# Patient Record
Sex: Female | Born: 1990 | Race: Black or African American | Hispanic: No | Marital: Single | Smoking: Former smoker
Health system: Southern US, Community
[De-identification: ages and names within clinical notes are randomized; demographics above are authoritative.]

## PROBLEM LIST (undated history)

## (undated) DIAGNOSIS — G4700 Insomnia, unspecified: Secondary | ICD-10-CM

## (undated) DIAGNOSIS — B9689 Other specified bacterial agents as the cause of diseases classified elsewhere: Secondary | ICD-10-CM

## (undated) DIAGNOSIS — N760 Acute vaginitis: Secondary | ICD-10-CM

## (undated) DIAGNOSIS — B009 Herpesviral infection, unspecified: Secondary | ICD-10-CM

## (undated) DIAGNOSIS — F32A Depression, unspecified: Secondary | ICD-10-CM

## (undated) DIAGNOSIS — F329 Major depressive disorder, single episode, unspecified: Secondary | ICD-10-CM

## (undated) DIAGNOSIS — F419 Anxiety disorder, unspecified: Secondary | ICD-10-CM

## (undated) DIAGNOSIS — F3130 Bipolar disorder, current episode depressed, mild or moderate severity, unspecified: Secondary | ICD-10-CM

## (undated) HISTORY — DX: Insomnia, unspecified: G47.00

## (undated) HISTORY — DX: Anxiety disorder, unspecified: F41.9

## (undated) HISTORY — DX: Bipolar disorder, current episode depressed, mild or moderate severity, unspecified: F31.30

## (undated) HISTORY — DX: Depression, unspecified: F32.A

## (undated) HISTORY — PX: NO PAST SURGERIES: SHX2092

## (undated) HISTORY — DX: Major depressive disorder, single episode, unspecified: F32.9

---
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
## 2011-05-20 ENCOUNTER — Encounter (HOSPITAL_COMMUNITY): Payer: Self-pay | Admitting: *Deleted

## 2011-05-20 ENCOUNTER — Emergency Department (HOSPITAL_COMMUNITY)
Admission: EM | Admit: 2011-05-20 | Discharge: 2011-05-20 | Disposition: A | Discharge status: Home / Self Care | Payer: Self-pay | Attending: Emergency Medicine | Admitting: Emergency Medicine

## 2011-05-20 ENCOUNTER — Emergency Department (HOSPITAL_COMMUNITY): Payer: Self-pay

## 2011-05-20 DIAGNOSIS — O98819 Other maternal infectious and parasitic diseases complicating pregnancy, unspecified trimester: Secondary | ICD-10-CM | POA: Insufficient documentation

## 2011-05-20 DIAGNOSIS — O26899 Other specified pregnancy related conditions, unspecified trimester: Secondary | ICD-10-CM

## 2011-05-20 DIAGNOSIS — N898 Other specified noninflammatory disorders of vagina: Secondary | ICD-10-CM | POA: Insufficient documentation

## 2011-05-20 DIAGNOSIS — N852 Hypertrophy of uterus: Secondary | ICD-10-CM | POA: Insufficient documentation

## 2011-05-20 DIAGNOSIS — R1031 Right lower quadrant pain: Secondary | ICD-10-CM | POA: Insufficient documentation

## 2011-05-20 DIAGNOSIS — N949 Unspecified condition associated with female genital organs and menstrual cycle: Secondary | ICD-10-CM | POA: Insufficient documentation

## 2011-05-20 DIAGNOSIS — A599 Trichomoniasis, unspecified: Secondary | ICD-10-CM | POA: Insufficient documentation

## 2011-05-20 LAB — CBC
HCT: 31.5 % — ABNORMAL LOW (ref 36.0–46.0)
Hemoglobin: 10.8 g/dL — ABNORMAL LOW (ref 12.0–15.0)
MCH: 30.9 pg (ref 26.0–34.0)
MCHC: 34.3 g/dL (ref 30.0–36.0)
MCV: 90.3 fL (ref 78.0–100.0)
Platelets: 268 10*3/uL (ref 150–400)
RBC: 3.49 MIL/uL — ABNORMAL LOW (ref 3.87–5.11)
RDW: 13.7 % (ref 11.5–15.5)
WBC: 8.7 10*3/uL (ref 4.0–10.5)

## 2011-05-20 LAB — URINALYSIS, ROUTINE W REFLEX MICROSCOPIC
Bilirubin Urine: NEGATIVE
Glucose, UA: NEGATIVE mg/dL
Hgb urine dipstick: NEGATIVE
Ketones, ur: NEGATIVE mg/dL
Nitrite: NEGATIVE
Protein, ur: NEGATIVE mg/dL
Specific Gravity, Urine: 1.015 (ref 1.005–1.030)
Urobilinogen, UA: 1 mg/dL (ref 0.0–1.0)
pH: 6.5 (ref 5.0–8.0)

## 2011-05-20 LAB — BASIC METABOLIC PANEL
BUN: 10 mg/dL (ref 6–23)
CO2: 26 mEq/L (ref 19–32)
Calcium: 9.7 mg/dL (ref 8.4–10.5)
Chloride: 103 mEq/L (ref 96–112)
Creatinine, Ser: 0.53 mg/dL (ref 0.50–1.10)
GFR calc Af Amer: >90 mL/min (ref >90)
GFR calc non Af Amer: >90 mL/min (ref >90)
Glucose, Bld: 81 mg/dL (ref 70–99)
Potassium: 4.2 mEq/L (ref 3.5–5.1)
Sodium: 137 mEq/L (ref 135–145)

## 2011-05-20 LAB — WET PREP, GENITAL
Trich, Wet Prep: NONE SEEN
Yeast Wet Prep HPF POC: NONE SEEN

## 2011-05-20 LAB — URINE MICROSCOPIC-ADD ON

## 2011-05-20 LAB — HCG, QUANTITATIVE, PREGNANCY: hCG, Beta Chain, Quant, S: 13882 m[IU]/mL — ABNORMAL HIGH (ref <5)

## 2011-05-20 LAB — POCT PREGNANCY, URINE: Preg Test, Ur: POSITIVE — AB

## 2011-05-20 MED ORDER — CEFTRIAXONE SODIUM 250 MG IJ SOLR
250.0000 mg | Freq: Once | INTRAMUSCULAR | Status: AC
  Administered 2011-05-20: 250 mg via INTRAMUSCULAR
  Filled 2011-05-20: qty 250

## 2011-05-20 MED ORDER — AZITHROMYCIN 1 G PO PACK
1.0000 g | PACK | Freq: Once | ORAL | Status: AC
  Administered 2011-05-20: 1 g via ORAL
  Filled 2011-05-20: qty 1

## 2011-05-20 MED ORDER — LIDOCAINE HCL (PF) 1 % IJ SOLN
INTRAMUSCULAR | Status: AC
  Administered 2011-05-20: 2 mL
  Filled 2011-05-20: qty 5

## 2011-05-20 MED ORDER — CEPHALEXIN 500 MG PO CAPS: 500.0000 mg | ORAL_CAPSULE | Freq: Four times a day (QID) | ORAL | Status: DC

## 2011-05-20 MED ORDER — METRONIDAZOLE 500 MG PO TABS
2000.0000 mg | ORAL_TABLET | Freq: Once | ORAL | Status: AC
  Administered 2011-05-20: 2000 mg via ORAL
  Filled 2011-05-20: qty 4

## 2011-05-20 NOTE — ED Notes (Signed)
Reports being approx [redacted] weeks pregnant, having RLQ pain and one episode of yellow vaginal discharge. Denies any n/v/d.

## 2011-05-20 NOTE — Discharge Instructions (Signed)
You will need followup your GYN doctor soon as possible next week.  Return here for any worsening in your condition.  Tylenol for your pain.

## 2011-05-20 NOTE — ED Provider Notes (Signed)
History     CSN: [id number]  Arrival date & time 05/20/11  1651   First MD Initiated Contact with Patient 05/20/11 1850      Chief Complaint  Patient presents with  . Abdominal Pain  . Routine Prenatal Visit    (Consider location/radiation/quality/duration/timing/severity/associated sxs/prior treatment) HPI The patient presents to the emergency department with lower pelvic pain and she is 4 months pregnant.  She states she started having some yellow vaginal discharge 2 days ago.  Patient states that she has not had any vaginal bleeding.  Patient denies any nausea/vomiting, diarrhea, chest pain or shortness of breath.  She also denies fevers at this time. History reviewed. No pertinent past medical history.  History reviewed. No pertinent past surgical history.  History reviewed. No pertinent family history.  History  Substance Use Topics  . Smoking status: Not on file  . Smokeless tobacco: Not on file  . Alcohol Use: No    OB History    Grav Para Term Preterm Abortions TAB SAB Ect Mult Living   1               Review of Systems All pertinent positives and negatives reviewed in the history of present illness  Allergies  Review of patient's allergies indicates no known allergies.  Home Medications   Current Outpatient Rx  Name Route Sig Dispense Refill  . ONDANSETRON HCL 4 MG PO TABS Oral Take 4 mg by mouth every 8 (eight) hours as needed. For nausea    . PRENATAL VITAMIN PO Oral Take 1 tablet by mouth daily.      BP 121/63  Pulse 80  Temp(Src) 98 F (36.7 C) (Oral)  Resp 17  SpO2 100%  Physical Exam  Constitutional: She appears well-developed and well-nourished. No distress.  HENT:  Head: Normocephalic and atraumatic.  Right Ear: External ear normal.  Eyes: Pupils are equal, round, and reactive to light.  Cardiovascular: Normal rate, regular rhythm, normal heart sounds and intact distal pulses.  Exam reveals no gallop and no friction rub.   No murmur  heard. Pulmonary/Chest: Effort normal and breath sounds normal. No respiratory distress. She has no wheezes. She has no rales. She exhibits no tenderness.  Abdominal: Soft. Bowel sounds are normal. She exhibits no distension. There is no tenderness. There is no rebound and no guarding.  Genitourinary: Pelvic exam was performed with patient supine. Uterus is enlarged. Uterus is not tender. Cervix exhibits discharge. Cervix exhibits no motion tenderness and no friability. Right adnexum displays tenderness. There is tenderness around the vagina. No bleeding around the vagina. Vaginal discharge found.  Skin: Skin is warm and dry. No rash noted.    ED Course  Procedures (including critical care time)  Labs Reviewed  URINALYSIS, ROUTINE W REFLEX MICROSCOPIC - Abnormal; Notable for the following:    APPearance CLOUDY (*)    Leukocytes, UA LARGE (*)    All other components within normal limits  CBC - Abnormal; Notable for the following:    RBC 3.49 (*)    Hemoglobin 10.8 (*)    HCT 31.5 (*)    All other components within normal limits  HCG, QUANTITATIVE, PREGNANCY - Abnormal; Notable for the following:    hCG, Beta Chain, Quant, S [id number] (*)    All other components within normal limits  POCT PREGNANCY, URINE - Abnormal; Notable for the following:    Preg Test, Ur POSITIVE (*)    All other components within normal limits  URINE MICROSCOPIC-ADD ON -  Abnormal; Notable for the following:    Squamous Epithelial / LPF MANY (*)    Bacteria, UA MANY (*)    All other components within normal limits  WET PREP, GENITAL - Abnormal; Notable for the following:    Clue Cells Wet Prep HPF POC FEW (*)    WBC, Wet Prep HPF POC MANY (*)    All other components within normal limits  BASIC METABOLIC PANEL  GC/CHLAMYDIA PROBE AMP, GENITAL   US Ob Limited  05/20/2011  *RADIOLOGY REPORT*  Clinical Data: Right lower quadrant pain.  Vaginal discharge.  [redacted] weeks gestation.  LIMITED OBSTETRIC ULTRASOUND  Number of  Fetuses: 1 Heart Rate: 143bpm Movement: Yes Presentation: Variable Placental Location: Posterior Previa: No Amniotic Fluid (Subjective): Normal  BPD: 4.6cm   19 w   6d  MATERNAL FINDINGS: Cervix: Closed/ Uterus/Adnexae:  IMPRESSION: Single living intrauterine pregnancy.  No complications noted.  Recommend followup with non-emergent complete OB 14+ wk US examination for fetal biometric evaluation and anatomic survey. This could be performed at the [facility] [place].  Original Report Authenticated By: Rosealee Albee, M.D.     Patient has vaginal discharge noted on exam.  We will treat her for STDs along with trichomonas.  Patient explained she will need followup with her GYN doctor soon as possible.  She is told to return here for any worsening in her condition.  The cervix is closed on exam however there is discharge noted vaginally.    MDM  MDM Reviewed: nursing note and vitals Interpretation: labs and ultrasound            Carlyle Dolly, PA-C 05/20/11 2338

## 2011-05-21 NOTE — ED Provider Notes (Signed)
Medical screening examination/treatment/procedure(s) were performed by non-physician practitioner and as supervising physician I was immediately available for consultation/collaboration.  Raeford Razor, MD 05/21/11 [phone]

## 2011-05-22 LAB — GC/CHLAMYDIA PROBE AMP, GENITAL
Chlamydia, DNA Probe: NEGATIVE
GC Probe Amp, Genital: NEGATIVE

## 2014-01-19 ENCOUNTER — Encounter (HOSPITAL_COMMUNITY): Payer: Self-pay | Admitting: *Deleted

## 2015-12-08 ENCOUNTER — Emergency Department (HOSPITAL_COMMUNITY)
Admission: EM | Admit: 2015-12-08 | Discharge: 2015-12-09 | Disposition: A | Discharge status: Home / Self Care | Payer: Medicaid Other | Attending: Emergency Medicine | Admitting: Emergency Medicine

## 2015-12-08 ENCOUNTER — Encounter (HOSPITAL_COMMUNITY): Payer: Self-pay | Admitting: Emergency Medicine

## 2015-12-08 DIAGNOSIS — Z5181 Encounter for therapeutic drug level monitoring: Secondary | ICD-10-CM | POA: Diagnosis not present

## 2015-12-08 DIAGNOSIS — F1210 Cannabis abuse, uncomplicated: Secondary | ICD-10-CM | POA: Insufficient documentation

## 2015-12-08 DIAGNOSIS — R4182 Altered mental status, unspecified: Secondary | ICD-10-CM | POA: Diagnosis present

## 2015-12-08 DIAGNOSIS — F14129 Cocaine abuse with intoxication, unspecified: Secondary | ICD-10-CM | POA: Insufficient documentation

## 2015-12-08 LAB — BASIC METABOLIC PANEL
ANION GAP: 8 (ref 5–15)
BUN: 8 mg/dL (ref 6–20)
CHLORIDE: 103 mmol/L (ref 101–111)
CO2: 26 mmol/L (ref 22–32)
Calcium: 9.7 mg/dL (ref 8.9–10.3)
Creatinine, Ser: 0.82 mg/dL (ref 0.44–1.00)
GFR calc Af Amer: >60 mL/min (ref >60)
GLUCOSE: 100 mg/dL — AB (ref 65–99)
POTASSIUM: 3.5 mmol/L (ref 3.5–5.1)
SODIUM: 137 mmol/L (ref 135–145)

## 2015-12-08 LAB — CBG MONITORING, ED
GLUCOSE-CAPILLARY: 98 mg/dL (ref 65–99)
Glucose-Capillary: 90 mg/dL (ref 65–99)

## 2015-12-08 LAB — CBC
HEMATOCRIT: 39.5 % (ref 36.0–46.0)
HEMOGLOBIN: 13.7 g/dL (ref 12.0–15.0)
MCH: 31.1 pg (ref 26.0–34.0)
MCHC: 34.7 g/dL (ref 30.0–36.0)
MCV: 89.6 fL (ref 78.0–100.0)
Platelets: 270 10*3/uL (ref 150–400)
RBC: 4.41 MIL/uL (ref 3.87–5.11)
RDW: 13.3 % (ref 11.5–15.5)
WBC: 6.7 10*3/uL (ref 4.0–10.5)

## 2015-12-08 MED ORDER — SODIUM CHLORIDE 0.9 % IV BOLUS (SEPSIS)
1000.0000 mL | Freq: Once | INTRAVENOUS | Status: AC
  Administered 2015-12-09: 1000 mL via INTRAVENOUS

## 2015-12-08 MED ORDER — AMMONIA AROMATIC IN INHA
RESPIRATORY_TRACT | Status: AC
  Filled 2015-12-08: qty 10

## 2015-12-08 NOTE — ED Triage Notes (Signed)
Pt's family states that pt ate rock candy today that they bought outside of a convience store and she threw up and now she is not responding when spoken to. Alert.

## 2015-12-08 NOTE — ED Provider Notes (Signed)
[facility]-EMERGENCY DEPT Provider Note   CSN: [id number] Arrival date & time: 12/08/15  2201  By signing my name below, I, Tara Colon. Tara Colon, attest that this documentation has been prepared under the direction and in the presence of [name], MD.  Electronically Signed: Suzan Colon. Tara Colon, ED Scribe. 12/08/15. 12:02 AM.    History   Chief Complaint Chief Complaint  Patient presents with  . Altered Mental Status    The history is provided by a relative and the spouse. No language interpreter was used.  Altered Mental Status   This is a new problem. The current episode started yesterday. The problem has not changed since onset.Associated symptoms include somnolence and unresponsiveness. Pertinent negatives include no weakness, no agitation, no delusions and no self-injury. Risk factors include the patient not taking medications correctly. Her past medical history does not include seizures or AIDS.    HPI Comments: Tara Colon is a 25 y.o. female without any pertinent past medical history who presents to the Emergency Department here for altered mental status this evening. Boyfriend states pt ingested a substance that looked like "red rock candy" with a salt/sugar taste at approximately 2:00 AM yesterday. Substance was purchased from an unknown gentleman outside of a store. Family states pt began vomited at 5:00 PM this evening gradually became more lethargic afterwards. Boyfriend states she has not spoken or answered questions in several hours. No recent falls or injury sustained after ingestion. She is not currently on any medications. No prior history of same.  PCP: No primary care provider on file.    History reviewed. No pertinent past medical history.  There are no active problems to display for this patient.   History reviewed. No pertinent surgical history.  OB History    Gravida Para Term Preterm AB Living   1             SAB TAB Ectopic Multiple Live Births                     Home Medications    Prior to Admission medications   Medication Sig Start Date End Date Taking? Authorizing Provider  ondansetron (ZOFRAN) 4 MG tablet Take 4 mg by mouth every 8 (eight) hours as needed. For nausea    Historical Provider, MD  Prenatal Vit-Fe Sulfate-FA (PRENATAL VITAMIN PO) Take 1 tablet by mouth daily.    Historical Provider, MD    Family History History reviewed. No pertinent family history.  Social History Social History  Substance Use Topics  . Smoking status: Not on file  . Smokeless tobacco: Not on file  . Alcohol use No     Allergies   Review of patient's allergies indicates no known allergies.   Review of Systems Review of Systems  Unable to perform ROS: Mental status change  Skin: Negative for pallor and rash.  Neurological: Negative for weakness.  Psychiatric/Behavioral: Negative for agitation and self-injury.     Physical Exam Updated Vital Signs BP 152/76 (BP Location: Right Arm)   Pulse 104   Temp 100 F (37.8 C) (Oral)   Resp 20   LMP 11/24/2015 (Approximate)   SpO2 100%   Breastfeeding? Unknown   Physical Exam  Constitutional: She appears well-developed and well-nourished.  HENT:  Head: Normocephalic and atraumatic. Head is without raccoon's eyes and without Battle's sign.  Right Ear: No hemotympanum.  Left Ear: No hemotympanum.  Mouth/Throat: Oropharynx is clear and moist.  Eyes: Conjunctivae are normal.  Pupils are sluggish  bilaterally   Neck: Normal range of motion. Neck supple. No JVD present. No tracheal deviation present.  Cardiovascular: Normal rate, regular rhythm, normal heart sounds and intact distal pulses.   No murmur heard. Pulses:      Dorsalis pedis pulses are 2+ on the right side, and 2+ on the left side.  Pulmonary/Chest: Effort normal. No stridor. No respiratory distress. She has no wheezes. She has no rales.  Bradypnea  Abdominal: Soft. Bowel sounds are normal. She exhibits no distension and no mass.  There is no tenderness. There is no guarding.  Musculoskeletal: Normal range of motion.  All compartments soft  Neurological: She has normal reflexes. GCS eye subscore is 4. GCS verbal subscore is 1. GCS motor subscore is 6.  DTR are 2+ bilaterally   Nursing note and vitals reviewed.    ED Treatments / Results   DIAGNOSTIC STUDIES: Oxygen Saturation is 100% on RA, Normal by my interpretation.    COORDINATION OF CARE: 11:45 PM- Will order blood work, urinalysis, imaging, and EKG. Will give fluids. Discussed treatment plan with family at bedside and they agreed to plan.     1:01 AM- Pupillary response after 2 mg/2 mL of Narcan administration. Pupils now 3-5 mm which were initially point at 2 mm.  1:43 AM- Spoke with poison control who suggested continued monitoring and fluids.   Vitals:   12/09/15 0324 12/09/15 0330  BP:  114/65  Pulse:  84  Resp:  24  Temp: 98.7 F (37.1 C)     EKG Interpretation  Date/Time:  Wednesday December 08 2015 23:48:09 EDT Ventricular Rate:  87 PR Interval:    QRS Duration: 104 QT Interval:  356 QTC Calculation: 429 R Axis:   76 Text Interpretation:  Sinus rhythm Borderline T wave abnormalities Confirmed by [facility]  MD, Nickalous Stingley ([id number]) on 12/09/2015 12:06:08 AM      Results for orders placed or performed during the hospital encounter of 12/08/15  Basic metabolic panel  Result Value Ref Range   Sodium 137 135 - 145 mmol/L   Potassium 3.5 3.5 - 5.1 mmol/L   Chloride 103 101 - 111 mmol/L   CO2 26 22 - 32 mmol/L   Glucose, Bld 100 (H) 65 - 99 mg/dL   BUN 8 6 - 20 mg/dL   Creatinine, Ser 5.780.82 0.44 - 1.00 mg/dL   Calcium 9.7 8.9 - 46.910.3 mg/dL   GFR calc non Af Amer >60 >60 mL/min   GFR calc Af Amer >60 >60 mL/min   Anion gap 8 5 - 15  CBC  Result Value Ref Range   WBC 6.7 4.0 - 10.5 K/uL   RBC 4.41 3.87 - 5.11 MIL/uL   Hemoglobin 13.7 12.0 - 15.0 g/dL   HCT 62.939.5 52.836.0 - 41.346.0 %   MCV 89.6 78.0 - 100.0 fL   MCH 31.1 26.0 - 34.0 pg    MCHC 34.7 30.0 - 36.0 g/dL   RDW 24.413.3 01.011.5 - 27.215.5 %   Platelets 270 150 - 400 K/uL  Urinalysis, Routine w reflex microscopic  Result Value Ref Range   Color, Urine YELLOW YELLOW   APPearance CLEAR CLEAR   Specific Gravity, Urine 1.005 1.005 - 1.030   pH 6.0 5.0 - 8.0   Glucose, UA NEGATIVE NEGATIVE mg/dL   Hgb urine dipstick NEGATIVE NEGATIVE   Bilirubin Urine NEGATIVE NEGATIVE   Ketones, ur 15 (A) NEGATIVE mg/dL   Protein, ur NEGATIVE NEGATIVE mg/dL   Nitrite NEGATIVE NEGATIVE   Leukocytes, UA  NEGATIVE NEGATIVE  Comprehensive metabolic panel  Result Value Ref Range   Sodium 138 135 - 145 mmol/L   Potassium 3.4 (L) 3.5 - 5.1 mmol/L   Chloride 102 101 - 111 mmol/L   CO2 27 22 - 32 mmol/L   Glucose, Bld 99 65 - 99 mg/dL   BUN 8 6 - 20 mg/dL   Creatinine, Ser 1.61 0.44 - 1.00 mg/dL   Calcium 9.8 8.9 - 09.6 mg/dL   Total Protein 8.1 6.5 - 8.1 g/dL   Albumin 4.3 3.5 - 5.0 g/dL   AST 28 15 - 41 U/L   ALT 29 14 - 54 U/L   Alkaline Phosphatase 65 38 - 126 U/L   Total Bilirubin 0.6 0.3 - 1.2 mg/dL   GFR calc non Af Amer >60 >60 mL/min   GFR calc Af Amer >60 >60 mL/min   Anion gap 9 5 - 15  Lipase, blood  Result Value Ref Range   Lipase 17 11 - 51 U/L  Ethanol  Result Value Ref Range   Alcohol, Ethyl (B) <5 <5 mg/dL  Salicylate level  Result Value Ref Range   Salicylate Lvl <4.0 2.8 - 30.0 mg/dL  Acetaminophen level  Result Value Ref Range   Acetaminophen (Tylenol), Serum <10 (L) 10 - 30 ug/mL  Urine rapid drug screen (hosp performed)  Result Value Ref Range   Opiates NONE DETECTED NONE DETECTED   Cocaine POSITIVE (A) NONE DETECTED   Benzodiazepines NONE DETECTED NONE DETECTED   Amphetamines NONE DETECTED NONE DETECTED   Tetrahydrocannabinol POSITIVE (A) NONE DETECTED   Barbiturates NONE DETECTED NONE DETECTED  CBG monitoring, ED  Result Value Ref Range   Glucose-Capillary 90 65 - 99 mg/dL  CBG monitoring, ED  Result Value Ref Range   Glucose-Capillary 98 65 - 99  mg/dL  POC urine preg, ED (not at [facility])  Result Value Ref Range   Preg Test, Ur NEGATIVE NEGATIVE   Ct Head Wo Contrast  Result Date: 12/09/2015 CLINICAL DATA:  Acute onset of vomiting after eating rock candy. Patient unresponsive. Initial encounter. EXAM: CT HEAD WITHOUT CONTRAST TECHNIQUE: Contiguous axial images were obtained from the base of the skull through the vertex without intravenous contrast. COMPARISON:  CT of the head performed 01/23/2015 FINDINGS: Brain: No evidence of acute infarction, hemorrhage, hydrocephalus, extra-axial collection or mass lesion/mass effect. The posterior fossa, including the cerebellum, brainstem and fourth ventricle, is within normal limits. The third and lateral ventricles, and basal ganglia are unremarkable in appearance. The cerebral hemispheres are symmetric in appearance, with normal gray-white differentiation. No mass effect or midline shift is seen. Vascular: No hyperdense vessel or unexpected calcification. Skull: There is no evidence of fracture; visualized osseous structures are unremarkable in appearance. Sinuses/Orbits: The visualized portions of the orbits are within normal limits. The paranasal sinuses and mastoid air cells are well-aerated. Other: No significant soft tissue abnormalities are seen. IMPRESSION: Unremarkable noncontrast CT of the head. Electronically Signed   By: Roanna Raider M.D.   On: 12/09/2015 00:47     Radiology No results found.  Procedures Procedures (including critical care time)  Medications Ordered in ED Medications  ammonia inhalant (not administered)  sodium chloride 0.9 % bolus 1,000 mL (0 mLs Intravenous Stopped 12/09/15 0155)  acetaminophen (TYLENOL) suppository 650 mg (650 mg Rectal Given 12/09/15 0103)  sodium chloride 0.9 % bolus 500 mL (0 mLs Intravenous Stopped 12/09/15 0246)  naloxone (NARCAN) injection 2 mg (2 mg Intravenous Given 12/09/15 0101)  methylPREDNISolone sodium succinate (  SOLU-MEDROL) 125 mg/2 mL  injection 125 mg (125 mg Intravenous Given 12/09/15 0318)  famotidine (PEPCID) IVPB 20 mg premix (0 mg Intravenous Stopped 12/09/15 0317)     Initial Impression / Assessment and Plan / ED Course  I have reviewed the triage vital signs and the nursing notes.  Pertinent labs & imaging results that were available during my care of the patient were reviewed by me and considered in my medical decision making (see chart for details).   Peri orbital and lip swelling resolved.   Final Clinical Impressions(s) / ED Diagnoses   Final diagnoses:  None  Now awake alert and talking.  Feels well.  Was not trying to hurt herself.  Claims she did not know what was in the rock candy they bought.  No ETOH.  No cocaine.  No Marijuana.  No buying substances off the street.  Patient verbalizes understanding.  All questions answered to patient's satisfaction. Based on history and exam patient has been appropriately medically screened and emergency conditions excluded. Patient is stable for discharge at this time. Follow up with your PMD for recheck in 2 days and strict return precautions given   New Prescriptions New Prescriptions   No medications on file   I personally performed the services described in this documentation, which was scribed in my presence. The recorded information has been reviewed and is accurate.      Cy Blamer, MD 12/09/15 [phone]

## 2015-12-09 ENCOUNTER — Emergency Department (HOSPITAL_COMMUNITY): Payer: Medicaid Other

## 2015-12-09 LAB — COMPREHENSIVE METABOLIC PANEL
ALK PHOS: 65 U/L (ref 38–126)
ALT: 29 U/L (ref 14–54)
ANION GAP: 9 (ref 5–15)
AST: 28 U/L (ref 15–41)
Albumin: 4.3 g/dL (ref 3.5–5.0)
BILIRUBIN TOTAL: 0.6 mg/dL (ref 0.3–1.2)
BUN: 8 mg/dL (ref 6–20)
CO2: 27 mmol/L (ref 22–32)
CREATININE: 0.69 mg/dL (ref 0.44–1.00)
Calcium: 9.8 mg/dL (ref 8.9–10.3)
Chloride: 102 mmol/L (ref 101–111)
Glucose, Bld: 99 mg/dL (ref 65–99)
Potassium: 3.4 mmol/L — ABNORMAL LOW (ref 3.5–5.1)
SODIUM: 138 mmol/L (ref 135–145)
Total Protein: 8.1 g/dL (ref 6.5–8.1)

## 2015-12-09 LAB — RAPID URINE DRUG SCREEN, HOSP PERFORMED
AMPHETAMINES: NOT DETECTED
Barbiturates: NOT DETECTED
Benzodiazepines: NOT DETECTED
Cocaine: POSITIVE — AB
Opiates: NOT DETECTED
Tetrahydrocannabinol: POSITIVE — AB

## 2015-12-09 LAB — URINALYSIS, ROUTINE W REFLEX MICROSCOPIC
BILIRUBIN URINE: NEGATIVE
GLUCOSE, UA: NEGATIVE mg/dL
HGB URINE DIPSTICK: NEGATIVE
Ketones, ur: 15 mg/dL — AB
Leukocytes, UA: NEGATIVE
Nitrite: NEGATIVE
PH: 6 (ref 5.0–8.0)
Protein, ur: NEGATIVE mg/dL
SPECIFIC GRAVITY, URINE: 1.005 (ref 1.005–1.030)

## 2015-12-09 LAB — SALICYLATE LEVEL: Salicylate Lvl: <4 mg/dL (ref 2.8–30.0)

## 2015-12-09 LAB — POC URINE PREG, ED: PREG TEST UR: NEGATIVE

## 2015-12-09 LAB — ETHANOL

## 2015-12-09 LAB — LIPASE, BLOOD: LIPASE: 17 U/L (ref 11–51)

## 2015-12-09 LAB — ACETAMINOPHEN LEVEL: Acetaminophen (Tylenol), Serum: <10 ug/mL — ABNORMAL LOW (ref 10–30)

## 2015-12-09 MED ORDER — SODIUM CHLORIDE 0.9 % IV BOLUS (SEPSIS)
500.0000 mL | Freq: Once | INTRAVENOUS | Status: AC
  Administered 2015-12-09: 500 mL via INTRAVENOUS

## 2015-12-09 MED ORDER — ACETAMINOPHEN 650 MG RE SUPP
650.0000 mg | Freq: Once | RECTAL | Status: AC
  Administered 2015-12-09: 650 mg via RECTAL
  Filled 2015-12-09: qty 1

## 2015-12-09 MED ORDER — METHYLPREDNISOLONE SODIUM SUCC 125 MG IJ SOLR
125.0000 mg | Freq: Once | INTRAMUSCULAR | Status: AC
  Administered 2015-12-09: 125 mg via INTRAVENOUS
  Filled 2015-12-09: qty 2

## 2015-12-09 MED ORDER — NALOXONE HCL 2 MG/2ML IJ SOSY
2.0000 mg | PREFILLED_SYRINGE | Freq: Once | INTRAMUSCULAR | Status: AC
  Administered 2015-12-09: 2 mg via INTRAVENOUS
  Filled 2015-12-09: qty 2

## 2015-12-09 MED ORDER — FAMOTIDINE IN NACL 20-0.9 MG/50ML-% IV SOLN
20.0000 mg | Freq: Once | INTRAVENOUS | Status: AC
  Administered 2015-12-09: 20 mg via INTRAVENOUS
  Filled 2015-12-09: qty 50

## 2015-12-09 NOTE — ED Notes (Signed)
Pt presents with horizontal nystagmus.  During insertion of tylenol suppository, pt began to resist.

## 2015-12-09 NOTE — ED Notes (Signed)
Dr. Nicanor AlconPalumbo informed pt is ready to go home.

## 2015-12-09 NOTE — ED Notes (Signed)
Patient transported to CT 

## 2015-12-09 NOTE — ED Notes (Signed)
Pt now admits to smoking marijuana this week & stated "we bought some rock candy from the man at the store.  I gave him money but I can't remember how much.  The candy was sweet."

## 2015-12-09 NOTE — ED Notes (Addendum)
Pt stated "I'm ready to go".  Pt has been drinking fluids provided.

## 2015-12-09 NOTE — ED Notes (Signed)
IV attempt x2 without success.

## 2016-02-02 ENCOUNTER — Encounter (HOSPITAL_COMMUNITY): Payer: Self-pay | Admitting: Emergency Medicine

## 2016-02-02 ENCOUNTER — Emergency Department (HOSPITAL_COMMUNITY)
Admission: EM | Admit: 2016-02-02 | Discharge: 2016-02-02 | Disposition: A | Discharge status: Home / Self Care | Payer: Medicaid Other | Attending: Emergency Medicine | Admitting: Emergency Medicine

## 2016-02-02 DIAGNOSIS — N898 Other specified noninflammatory disorders of vagina: Secondary | ICD-10-CM | POA: Diagnosis present

## 2016-02-02 DIAGNOSIS — F1290 Cannabis use, unspecified, uncomplicated: Secondary | ICD-10-CM | POA: Insufficient documentation

## 2016-02-02 DIAGNOSIS — B9689 Other specified bacterial agents as the cause of diseases classified elsewhere: Secondary | ICD-10-CM

## 2016-02-02 DIAGNOSIS — N760 Acute vaginitis: Secondary | ICD-10-CM | POA: Diagnosis not present

## 2016-02-02 HISTORY — DX: Other specified bacterial agents as the cause of diseases classified elsewhere: N76.0

## 2016-02-02 HISTORY — DX: Other specified bacterial agents as the cause of diseases classified elsewhere: B96.89

## 2016-02-02 LAB — POC URINE PREG, ED: Preg Test, Ur: NEGATIVE

## 2016-02-02 LAB — URINALYSIS, ROUTINE W REFLEX MICROSCOPIC
BILIRUBIN URINE: NEGATIVE
GLUCOSE, UA: NEGATIVE mg/dL
KETONES UR: NEGATIVE mg/dL
LEUKOCYTES UA: NEGATIVE
Nitrite: NEGATIVE
PROTEIN: NEGATIVE mg/dL
Specific Gravity, Urine: >1.03 — ABNORMAL HIGH (ref 1.005–1.030)
pH: 6 (ref 5.0–8.0)

## 2016-02-02 LAB — WET PREP, GENITAL
Sperm: NONE SEEN
Trich, Wet Prep: NONE SEEN
Yeast Wet Prep HPF POC: NONE SEEN

## 2016-02-02 LAB — URINE MICROSCOPIC-ADD ON

## 2016-02-02 MED ORDER — AZITHROMYCIN 250 MG PO TABS
1000.0000 mg | ORAL_TABLET | Freq: Once | ORAL | Status: AC
  Administered 2016-02-02: 1000 mg via ORAL
  Filled 2016-02-02: qty 4

## 2016-02-02 MED ORDER — CEFTRIAXONE SODIUM 250 MG IJ SOLR
250.0000 mg | Freq: Once | INTRAMUSCULAR | Status: AC
  Administered 2016-02-02: 250 mg via INTRAMUSCULAR
  Filled 2016-02-02: qty 250

## 2016-02-02 MED ORDER — METRONIDAZOLE 500 MG PO TABS: 500.0000 mg | ORAL_TABLET | Freq: Two times a day (BID) | ORAL | 0 refills | Status: DC

## 2016-02-02 NOTE — Discharge Instructions (Signed)
You were treated in the emergency department with intramuscular Rocephin and oral Zithromax. Your wet prep suggest bacterial vaginosis. Please use Flagyl daily with a meal. Do not use anything with alcohol while taking the Flagyl medication. Someone from the flow managers office will call you if there is an abnormality with your cultures, or your lab work.

## 2016-02-02 NOTE — ED Triage Notes (Signed)
Pt reports white vaginal discharge and pelvic pain x1 week.  Pt denies n/v/d.

## 2016-02-02 NOTE — ED Provider Notes (Signed)
[facility]-EMERGENCY DEPT Provider Note   CSN: [id number] Arrival date & time: 02/02/16  1641     History   Chief Complaint Chief Complaint  Patient presents with  . Vaginal Discharge    HPI Tara Colon is a 25 y.o. female.  The history is provided by the patient.  Vaginal Discharge   This is a new problem. The current episode started more than 1 week ago. The problem occurs daily. The problem has not changed since onset.The discharge occurs while at rest. The discharge was white. Associated symptoms include abdominal pain and perineal odor. Pertinent negatives include no fever, no vomiting and no dysuria. She has tried nothing for the symptoms.    Past Medical History:  Diagnosis Date  . BV (bacterial vaginosis)     There are no active problems to display for this patient.   History reviewed. No pertinent surgical history.  OB History    Gravida Para Term Preterm AB Living   1             SAB TAB Ectopic Multiple Live Births                   Home Medications    Prior to Admission medications   Not on File    Family History History reviewed. No pertinent family history.  Social History Social History  Substance Use Topics  . Smoking status: Never Smoker  . Smokeless tobacco: Not on file  . Alcohol use No     Allergies   Patient has no known allergies.   Review of Systems Review of Systems  Constitutional: Negative for fever.  Gastrointestinal: Positive for abdominal pain. Negative for vomiting.  Genitourinary: Positive for pelvic pain and vaginal discharge. Negative for dysuria and genital sores.  All other systems reviewed and are negative.    Physical Exam Updated Vital Signs BP 128/67 (BP Location: Left Arm)   Pulse 77   Temp 98.1 F (36.7 C) (Oral)   Resp 20   Ht 5\' 7"  (1.702 m)   Wt 77.1 kg   LMP 01/19/2016 (Approximate)   SpO2 100%   BMI 26.63 kg/m   Physical Exam  Constitutional: She is oriented to person, place, and time.  She appears well-developed and well-nourished.  Non-toxic appearance.  HENT:  Head: Normocephalic.  Right Ear: Tympanic membrane and external ear normal.  Left Ear: Tympanic membrane and external ear normal.  Eyes: EOM and lids are normal. Pupils are equal, round, and reactive to light.  Neck: Normal range of motion. Neck supple. Carotid bruit is not present.  Cardiovascular: Normal rate, regular rhythm, normal heart sounds, intact distal pulses and normal pulses.   Pulmonary/Chest: Breath sounds normal. No respiratory distress.  Abdominal: Soft. Bowel sounds are normal. There is no tenderness. There is no guarding.  Genitourinary:  Genitourinary Comments: Chaperone present during the examination.  The external structures are in order. No unusual rash noted. There is a thick white to gray vaginal discharge with mild to moderate older. The cervix is friable. Is no cervical wall motion tenderness. There is no adnexal tenderness. No foreign body noted in the vaginal area.  Musculoskeletal: Normal range of motion.  Lymphadenopathy:       Head (right side): No submandibular adenopathy present.       Head (left side): No submandibular adenopathy present.    She has no cervical adenopathy.  Neurological: She is alert and oriented to person, place, and time. She has normal strength. No cranial  nerve deficit or sensory deficit.  Skin: Skin is warm and dry.  Psychiatric: She has a normal mood and affect. Her speech is normal.  Nursing note and vitals reviewed.    ED Treatments / Results  Labs (all labs ordered are listed, but only abnormal results are displayed) Labs Reviewed  WET PREP, GENITAL  URINALYSIS, ROUTINE W REFLEX MICROSCOPIC ([facility])  RPR  HIV ANTIBODY (ROUTINE TESTING)  POC URINE PREG, ED  GC/CHLAMYDIA PROBE AMP ([place]) NOT AT [facility]    EKG  EKG Interpretation None       Radiology No results found.  Procedures Procedures (including critical care  time)  Medications Ordered in ED Medications - No data to display   Initial Impression / Assessment and Plan / ED Course  I have reviewed the triage vital signs and the nursing notes.  Pertinent labs & imaging results that were available during my care of the patient were reviewed by me and considered in my medical decision making (see chart for details).  Clinical Course     **I have reviewed nursing notes, vital signs, and all appropriate lab and imaging results for this patient.*  Final Clinical Impressions(s) / ED Diagnoses  Pregnancy test is negative. Wet prep reveals bacterial vaginosis, otherwise negative. GC, Chlamydia, RPR, and HIV are pending. Patient prescribed Flagyl. I discussed with the patient the importance of staying away from all alcohol beverages in any form. We also discussed the need to take the Flagyl with a full meal to prevent stomach upset. Patient acknowledges understanding of the instructions.    Final diagnoses:  None    New Prescriptions New Prescriptions   No medications on file     Ivery QualeHobson Gennaro Lizotte, PA-C 02/02/16 1906    Vanetta MuldersScott Zackowski, MD 02/05/16 1549

## 2016-02-03 LAB — HIV ANTIBODY (ROUTINE TESTING W REFLEX): HIV Screen 4th Generation wRfx: NONREACTIVE

## 2016-02-03 LAB — GC/CHLAMYDIA PROBE AMP (~~LOC~~) NOT AT ARMC
Chlamydia: NEGATIVE
Neisseria Gonorrhea: NEGATIVE

## 2016-02-03 LAB — RPR: RPR: NONREACTIVE

## 2017-01-30 ENCOUNTER — Ambulatory Visit: Payer: Self-pay | Admitting: Physician Assistant

## 2017-02-20 ENCOUNTER — Encounter: Payer: Self-pay | Admitting: Physician Assistant

## 2017-02-20 ENCOUNTER — Ambulatory Visit: Payer: Medicaid Other | Admitting: Physician Assistant

## 2017-02-20 VITALS — BP 104/64 | HR 68 | Temp 97.1°F | Ht 68.0 in | Wt 176.8 lb

## 2017-02-20 DIAGNOSIS — N760 Acute vaginitis: Secondary | ICD-10-CM | POA: Diagnosis not present

## 2017-02-20 DIAGNOSIS — R300 Dysuria: Secondary | ICD-10-CM | POA: Insufficient documentation

## 2017-02-20 DIAGNOSIS — B9689 Other specified bacterial agents as the cause of diseases classified elsewhere: Secondary | ICD-10-CM | POA: Insufficient documentation

## 2017-02-20 DIAGNOSIS — N3001 Acute cystitis with hematuria: Secondary | ICD-10-CM | POA: Diagnosis not present

## 2017-02-20 DIAGNOSIS — F0630 Mood disorder due to known physiological condition, unspecified: Secondary | ICD-10-CM | POA: Insufficient documentation

## 2017-02-20 DIAGNOSIS — F319 Bipolar disorder, unspecified: Secondary | ICD-10-CM

## 2017-02-20 LAB — MICROSCOPIC EXAMINATION: RENAL EPITHEL UA: NONE SEEN /HPF

## 2017-02-20 LAB — URINALYSIS, COMPLETE
BILIRUBIN UA: NEGATIVE
Glucose, UA: NEGATIVE
Ketones, UA: NEGATIVE
LEUKOCYTES UA: NEGATIVE
Nitrite, UA: NEGATIVE
PH UA: 5.5 (ref 5.0–7.5)
PROTEIN UA: NEGATIVE
Specific Gravity, UA: >1.03 — ABNORMAL HIGH (ref 1.005–1.030)
Urobilinogen, Ur: 0.2 mg/dL (ref 0.2–1.0)

## 2017-02-20 LAB — WET PREP FOR TRICH, YEAST, CLUE
Clue Cell Exam: NEGATIVE
Trichomonas Exam: NEGATIVE
Yeast Exam: NEGATIVE

## 2017-02-20 MED ORDER — CIPROFLOXACIN HCL 500 MG PO TABS: 500.0000 mg | ORAL_TABLET | Freq: Two times a day (BID) | ORAL | 0 refills | Status: DC

## 2017-02-20 NOTE — Patient Instructions (Signed)

## 2017-02-20 NOTE — Progress Notes (Signed)
BP 104/64   Pulse 68   Temp (!) 97.1 F (36.2 C) (Oral)   Ht 5\' 8"  (1.727 m)   Wt 176 lb 12.8 oz (80.2 kg)   BMI 26.88 kg/m    Subjective:    Patient ID: Tara Colon Drennon, female    DOB: April 01, 1990, 26 y.o.   MRN: [id number]  HPI: Tara Colon Gillyard is a 26 y.o. female presenting on 02/20/2017 for New Patient (Initial Visit)  Is a new patient today.  She had primarily been seen through the health department in the past.  She does have Medicaid at this time.  In March 2018 the patient was hospitalized at old [facility] facility.  She had a discharge summary that showed medications including BuSpar, Depakote, Lunesta, gabapentin, Latuda, Seroquel, trazodone.  She states that several of those made her feel bad but she did not know which ones she ended up discontinuing them on.  She has not had follow-up with psychiatry.  She does have a diagnosis of bipolar disorder.  She also continues with bacterial discharge and urinary frequency.  She has had a history of urinary tract infections and frequent BV at infections.  We will check these today.  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  She will occasionally still use pot.  Relevant past medical, surgical, family and social history reviewed and updated as indicated. Allergies and medications reviewed and updated.  Past Medical History:  Diagnosis Date  . Anxiety   . Bipolar affect, depressed (HCC)   . BV (bacterial vaginosis)   . Depression     History reviewed. No pertinent surgical history.  Review of Systems  Constitutional: Negative.  Negative for activity change, fatigue and fever.  HENT: Negative.   Eyes: Negative.   Respiratory: Negative.  Negative for cough.   Cardiovascular: Negative.  Negative for chest pain.  Gastrointestinal: Negative.  Negative for abdominal pain.  Endocrine: Negative.   Genitourinary: Positive for dysuria, frequency, urgency and vaginal discharge. Negative for hematuria, vaginal bleeding and  vaginal pain.  Musculoskeletal: Negative.   Skin: Negative.   Neurological: Negative.     Allergies as of 02/20/2017   No Known Allergies     Medication List        Accurate as of 02/20/17 11:23 AM. Always use your most recent med list.          ciprofloxacin 500 MG tablet Commonly known as:  CIPRO Take 1 tablet (500 mg total) by mouth 2 (two) times daily.   metroNIDAZOLE 500 MG tablet Commonly known as:  FLAGYL Take 1 tablet (500 mg total) by mouth 2 (two) times daily. Take this medication with food.          Objective:    BP 104/64   Pulse 68   Temp (!) 97.1 F (36.2 C) (Oral)   Ht 5\' 8"  (1.727 m)   Wt 176 lb 12.8 oz (80.2 kg)   BMI 26.88 kg/m   No Known Allergies  Physical Exam  Constitutional: She is oriented to person, place, and time. She appears well-developed and well-nourished.  HENT:  Head: Normocephalic and atraumatic.  Eyes: Conjunctivae are normal. Pupils are equal, round, and reactive to light.  Cardiovascular: Normal rate, regular rhythm, normal heart sounds and intact distal pulses.  Pulmonary/Chest: Effort normal and breath sounds normal.  Abdominal: Soft. Bowel sounds are normal. She exhibits no distension and no mass. There is tenderness in the suprapubic area. There is no rebound, no guarding and  no CVA tenderness.  Neurological: She is alert and oriented to person, place, and time. She has normal reflexes.  Skin: Skin is warm and dry. No rash noted.  Psychiatric: She has a normal mood and affect. Her behavior is normal. Judgment and thought content normal.  Nursing note and vitals reviewed.   Results for orders placed or performed during the hospital encounter of 02/02/16  Wet prep, genital  Result Value Ref Range   Yeast Wet Prep HPF POC NONE SEEN NONE SEEN   Trich, Wet Prep NONE SEEN NONE SEEN   Clue Cells Wet Prep HPF POC PRESENT (A) NONE SEEN   WBC, Wet Prep HPF POC MODERATE (A) NONE SEEN   Sperm NONE SEEN   Urinalysis, Routine w  reflex microscopic (not at [facility])  Result Value Ref Range   Color, Urine YELLOW YELLOW   APPearance CLEAR CLEAR   Specific Gravity, Urine >1.030 (H) 1.005 - 1.030   pH 6.0 5.0 - 8.0   Glucose, UA NEGATIVE NEGATIVE mg/dL   Hgb urine dipstick TRACE (A) NEGATIVE   Bilirubin Urine NEGATIVE NEGATIVE   Ketones, ur NEGATIVE NEGATIVE mg/dL   Protein, ur NEGATIVE NEGATIVE mg/dL   Nitrite NEGATIVE NEGATIVE   Leukocytes, UA NEGATIVE NEGATIVE  RPR  Result Value Ref Range   RPR Ser Ql Non Reactive Non Reactive  HIV antibody  Result Value Ref Range   HIV Screen 4th Generation wRfx Non Reactive Non Reactive  Urine microscopic-add on  Result Value Ref Range   Squamous Epithelial / LPF 6-30 (A) NONE SEEN   WBC, UA 0-5 0 - 5 WBC/hpf   RBC / HPF 0-5 0 - 5 RBC/hpf   Bacteria, UA MANY (A) NONE SEEN  POC Urine Pregnancy, ED (do NOT order at [facility])  Result Value Ref Range   Preg Test, Ur NEGATIVE NEGATIVE  GC/Chlamydia probe amp ([place])not at [facility]  Result Value Ref Range   Chlamydia Negative    Neisseria gonorrhea Negative       Assessment & Plan:   1. Bacterial vaginosis - WET PREP FOR TRICH, YEAST, CLUE  2. Dysuria - Urine Culture - Urinalysis, Complete  3. Bipolar affective disorder, remission status unspecified (HCC) - Ambulatory referral to Psychiatry  4. Acute cystitis with hematuria cipro 500 mg 1 BID 10 days    Current Outpatient Medications:  .  metroNIDAZOLE (FLAGYL) 500 MG tablet, Take 1 tablet (500 mg total) by mouth 2 (two) times daily. Take this medication with food., Disp: 14 tablet, Rfl: 0 .  ciprofloxacin (CIPRO) 500 MG tablet, Take 1 tablet (500 mg total) by mouth 2 (two) times daily., Disp: 20 tablet, Rfl: 0 Continue all other maintenance medications as listed above.  Follow up plan: No Follow-up on file.  Educational handout given for survey  Remus LofflerAngel S. Willistine Ferrall PA-C [facility] [address]  [place], [place]  [id number] [phone]   02/20/2017, 11:23 AM

## 2017-02-22 LAB — URINE CULTURE

## 2017-04-04 ENCOUNTER — Telehealth: Payer: Self-pay | Admitting: Physician Assistant

## 2017-04-04 NOTE — Telephone Encounter (Signed)
Was patient was supposed to referred to psych? Do not see referral and patient is unsure. Please advise

## 2017-04-04 NOTE — Telephone Encounter (Signed)
Still checking

## 2017-04-10 NOTE — Telephone Encounter (Signed)
I am unable to see referral.Checked with Tara Colon and she can see this and will f/u on it

## 2017-04-10 NOTE — Telephone Encounter (Signed)
Patient in is [facility]'s workqueue to schedule

## 2017-04-11 ENCOUNTER — Encounter: Payer: Self-pay | Admitting: Physician Assistant

## 2017-04-11 ENCOUNTER — Telehealth: Payer: Self-pay | Admitting: Physician Assistant

## 2017-04-11 ENCOUNTER — Ambulatory Visit: Payer: Medicaid Other | Admitting: Physician Assistant

## 2017-04-11 VITALS — BP 116/66 | HR 75 | Temp 97.8°F | Ht 68.0 in | Wt 177.8 lb

## 2017-04-11 DIAGNOSIS — H60503 Unspecified acute noninfective otitis externa, bilateral: Secondary | ICD-10-CM | POA: Diagnosis not present

## 2017-04-11 MED ORDER — NEOMYCIN-COLIST-HC-THONZONIUM 3.3-3-10-0.5 MG/ML OT SUSP: 3.0000 [drp] | Freq: Four times a day (QID) | OTIC | 0 refills | Status: DC

## 2017-04-11 MED ORDER — CEFDINIR 300 MG PO CAPS: 300.0000 mg | ORAL_CAPSULE | Freq: Two times a day (BID) | ORAL | 0 refills | Status: DC

## 2017-04-11 MED ORDER — CIPROFLOXACIN-DEXAMETHASONE 0.3-0.1 % OT SUSP: 4.0000 [drp] | Freq: Two times a day (BID) | OTIC | 0 refills | Status: DC

## 2017-04-11 NOTE — Telephone Encounter (Signed)
Please review and advise.

## 2017-04-12 ENCOUNTER — Telehealth: Payer: Self-pay

## 2017-04-12 NOTE — Telephone Encounter (Signed)
[facility] left message 

## 2017-04-13 NOTE — Progress Notes (Signed)
BP 116/66   Pulse 75   Temp 97.8 F (36.6 C) (Oral)   Ht 5\' 8"  (1.727 m)   Wt 177 lb 12.8 oz (80.6 kg)   BMI 27.03 kg/m    Subjective:    Patient ID: Tara Colon, female    DOB: 1990-12-12, 27 y.o.   MRN: [id number]  HPI: Tara Colon is a 27 y.o. female presenting on 04/11/2017 for Ear Pain  Over the past week patient has had increasing ear pain.  There is also been drainage involved.  She has had a history of ear infections and swimmer's ear in the past.  She denies any fever or chills.  Relevant past medical, surgical, family and social history reviewed and updated as indicated. Allergies and medications reviewed and updated.  Past Medical History:  Diagnosis Date  . Anxiety   . Bipolar affect, depressed (HCC)   . BV (bacterial vaginosis)   . Depression     History reviewed. No pertinent surgical history.  Review of Systems  Constitutional: Negative.  Negative for chills, diaphoresis, fatigue and fever.  HENT: Positive for congestion and ear pain.   Eyes: Negative.   Respiratory: Negative.  Negative for shortness of breath and wheezing.   Gastrointestinal: Negative.   Genitourinary: Negative.     Allergies as of 04/11/2017   No Known Allergies     Medication List        Accurate as of 04/11/17 11:59 PM. Always use your most recent med list.          cefdinir 300 MG capsule Commonly known as:  OMNICEF Take 1 capsule (300 mg total) by mouth 2 (two) times daily. 1 po BID   ciprofloxacin-dexamethasone OTIC suspension Commonly known as:  CIPRODEX Place 4 drops into both ears 2 (two) times daily.          Objective:    BP 116/66   Pulse 75   Temp 97.8 F (36.6 C) (Oral)   Ht 5\' 8"  (1.727 m)   Wt 177 lb 12.8 oz (80.6 kg)   BMI 27.03 kg/m   No Known Allergies  Physical Exam  Constitutional: She is oriented to person, place, and time. She appears well-developed and well-nourished.  HENT:  Head: Normocephalic and atraumatic.  Right Ear: There  is drainage, swelling and tenderness.  Left Ear: There is drainage, swelling and tenderness.  Sl tenderness on left mastoid  Eyes: Conjunctivae and EOM are normal. Pupils are equal, round, and reactive to light.  Cardiovascular: Normal rate, regular rhythm, normal heart sounds and intact distal pulses.  Pulmonary/Chest: Effort normal and breath sounds normal.  Abdominal: Soft. Bowel sounds are normal.  Neurological: She is alert and oriented to person, place, and time. She has normal reflexes.  Skin: Skin is warm and dry. No rash noted.  Psychiatric: She has a normal mood and affect. Her behavior is normal. Judgment and thought content normal.        Assessment & Plan:   1. Acute otitis externa of both ears, unspecified type - cefdinir (OMNICEF) 300 MG capsule; Take 1 capsule (300 mg total) by mouth 2 (two) times daily. 1 po BID  Dispense: 20 capsule; Refill: 0 - ciprofloxacin-dexamethasone (CIPRODEX) OTIC suspension; Place 4 drops into both ears 2 (two) times daily.  Dispense: 7.5 mL; Refill: 0    Current Outpatient Medications:  .  cefdinir (OMNICEF) 300 MG capsule, Take 1 capsule (300 mg total) by mouth 2 (two) times daily. 1 po BID, Disp: 20  capsule, Rfl: 0 .  ciprofloxacin-dexamethasone (CIPRODEX) OTIC suspension, Place 4 drops into both ears 2 (two) times daily., Disp: 7.5 mL, Rfl: 0 Continue all other maintenance medications as listed above.  Follow up plan: No Follow-up on file.  Educational handout given for survey  Remus LofflerAngel S. Afiya Ferrebee PA-C [facility] [address]  [place], [place] [id number] [phone]   04/13/2017, 8:12 AM

## 2017-05-01 ENCOUNTER — Telehealth: Payer: Self-pay

## 2017-05-01 NOTE — Telephone Encounter (Signed)
Writer left a voice mail message on 05-13-2017; 04-26-2017 and 05-01-2017.  Patient will be placed in the inactive list with [facility].  Patient has a scheduled appt with a therapist Florencia Reasonseggy Bynum [facility].    Writer will inform the PCP and Dr. Vanetta ShawlHisada through epic in basket email.

## 2017-05-02 ENCOUNTER — Ambulatory Visit (INDEPENDENT_AMBULATORY_CARE_PROVIDER_SITE_OTHER): Payer: Medicaid Other | Admitting: Psychiatry

## 2017-05-02 ENCOUNTER — Encounter (HOSPITAL_COMMUNITY): Payer: Self-pay | Admitting: Psychiatry

## 2017-05-02 DIAGNOSIS — F3130 Bipolar disorder, current episode depressed, mild or moderate severity, unspecified: Secondary | ICD-10-CM | POA: Diagnosis not present
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
## 2017-05-03 ENCOUNTER — Encounter (HOSPITAL_COMMUNITY): Payer: Self-pay | Admitting: Psychiatry

## 2017-05-03 ENCOUNTER — Ambulatory Visit (INDEPENDENT_AMBULATORY_CARE_PROVIDER_SITE_OTHER): Payer: Medicaid Other | Admitting: Psychiatry

## 2017-05-03 VITALS — BP 120/77 | HR 73 | Ht 68.0 in | Wt 173.0 lb

## 2017-05-03 DIAGNOSIS — Z8659 Personal history of other mental and behavioral disorders: Secondary | ICD-10-CM | POA: Diagnosis not present

## 2017-05-03 DIAGNOSIS — Z62811 Personal history of psychological abuse in childhood: Secondary | ICD-10-CM

## 2017-05-03 DIAGNOSIS — G4700 Insomnia, unspecified: Secondary | ICD-10-CM | POA: Diagnosis not present

## 2017-05-03 DIAGNOSIS — F4310 Post-traumatic stress disorder, unspecified: Secondary | ICD-10-CM | POA: Insufficient documentation

## 2017-05-03 DIAGNOSIS — Z813 Family history of other psychoactive substance abuse and dependence: Secondary | ICD-10-CM | POA: Diagnosis not present

## 2017-05-03 DIAGNOSIS — F17210 Nicotine dependence, cigarettes, uncomplicated: Secondary | ICD-10-CM

## 2017-05-03 DIAGNOSIS — Z79899 Other long term (current) drug therapy: Secondary | ICD-10-CM

## 2017-05-03 DIAGNOSIS — Z915 Personal history of self-harm: Secondary | ICD-10-CM

## 2017-05-03 DIAGNOSIS — F0630 Mood disorder due to known physiological condition, unspecified: Secondary | ICD-10-CM

## 2017-05-03 DIAGNOSIS — Z811 Family history of alcohol abuse and dependence: Secondary | ICD-10-CM | POA: Diagnosis not present

## 2017-05-03 DIAGNOSIS — Z818 Family history of other mental and behavioral disorders: Secondary | ICD-10-CM

## 2017-05-03 LAB — TSH: TSH: 1.35 m[IU]/L

## 2017-05-03 MED ORDER — TRAZODONE HCL 50 MG PO TABS: ORAL_TABLET | ORAL | 0 refills | Status: DC

## 2017-05-03 MED ORDER — LURASIDONE HCL 20 MG PO TABS: ORAL_TABLET | ORAL | 0 refills | Status: DC
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
## 2017-05-03 NOTE — Patient Instructions (Addendum)
1. Start Latuda 20 mg daily for one week, then 40 mg daily  2. Start Trazodone 25-50 mg at night as needed for sleep 3. Return to clinic in one month for 30 mins

## 2017-05-09 ENCOUNTER — Encounter: Payer: Self-pay | Admitting: Physician Assistant

## 2017-05-09 ENCOUNTER — Ambulatory Visit: Payer: Medicaid Other | Admitting: Physician Assistant

## 2017-05-09 VITALS — BP 119/69 | HR 84 | Temp 98.9°F | Ht 68.0 in | Wt 176.4 lb

## 2017-05-09 DIAGNOSIS — Z202 Contact with and (suspected) exposure to infections with a predominantly sexual mode of transmission: Secondary | ICD-10-CM | POA: Diagnosis not present

## 2017-05-09 MED ORDER — FLUCONAZOLE 150 MG PO TABS: 150.0000 mg | ORAL_TABLET | Freq: Once | ORAL | 0 refills | Status: AC

## 2017-05-09 NOTE — Progress Notes (Signed)
BP 119/69   Pulse 84   Temp 98.9 F (37.2 C) (Oral)   Ht 5\' 8"  (1.727 m)   Wt 176 lb 6.4 oz (80 kg)   BMI 26.82 kg/m    Subjective:    Patient ID: Tara Colon, female    DOB: 17-Apr-1990, 27 y.o.   MRN: [id number]  HPI: Tara Colon is a 27 y.o. female presenting on 05/09/2017 for No chief complaint on file. Has been exposed to STD through unprotected sex. Wants testing, only symptom is yeast discharge and itching. No odor or copious drainage.   Past Medical History:  Diagnosis Date  . Anxiety   . Bipolar affect, depressed (HCC)   . BV (bacterial vaginosis)   . Depression   . Insomnia    Relevant past medical, surgical, family and social history reviewed and updated as indicated. Interim medical history since our last visit reviewed. Allergies and medications reviewed and updated. DATA REVIEWED: CHART IN EPIC  Family History reviewed for pertinent findings.  Review of Systems  Constitutional: Negative.   HENT: Negative.   Eyes: Negative.   Respiratory: Negative.   Gastrointestinal: Negative.   Genitourinary: Negative.     Allergies as of 05/09/2017   No Known Allergies     Medication List        Accurate as of 05/09/17  3:04 PM. Always use your most recent med list.          fluconazole 150 MG tablet Commonly known as:  DIFLUCAN Take 1 tablet (150 mg total) by mouth once for 1 dose.   lurasidone 20 MG Tabs tablet Commonly known as:  LATUDA 20 mg daily for one week, then 40 mg daily   traZODone 50 MG tablet Commonly known as:  DESYREL 25-50 mg at night as needed for sleep          Objective:    BP 119/69   Pulse 84   Temp 98.9 F (37.2 C) (Oral)   Ht 5\' 8"  (1.727 m)   Wt 176 lb 6.4 oz (80 kg)   BMI 26.82 kg/m   No Known Allergies  Wt Readings from Last 3 Encounters:  05/09/17 176 lb 6.4 oz (80 kg)  04/11/17 177 lb 12.8 oz (80.6 kg)  02/20/17 176 lb 12.8 oz (80.2 kg)    Physical Exam  Constitutional: She is oriented to person,  place, and time. She appears well-developed and well-nourished.  HENT:  Head: Normocephalic and atraumatic.  Eyes: Conjunctivae and EOM are normal. Pupils are equal, round, and reactive to light.  Cardiovascular: Normal rate, regular rhythm, normal heart sounds and intact distal pulses.  Pulmonary/Chest: Effort normal and breath sounds normal.  Abdominal: Soft. Bowel sounds are normal.  Neurological: She is alert and oriented to person, place, and time. She has normal reflexes.  Skin: Skin is warm and dry. No rash noted.  Psychiatric: She has a normal mood and affect. Her behavior is normal. Judgment and thought content normal.        Assessment & Plan:   1. Sexually transmitted disease exposure - STD Screen (8) - GC probe amplification, urine - fluconazole (DIFLUCAN) 150 MG tablet; Take 1 tablet (150 mg total) by mouth once for 1 dose.  Dispense: 1 tablet; Refill: 0 - Chlamydia/Gonococcus/Trichomonas, NAA   Continue all other maintenance medications as listed above.  Follow up plan: No Follow-up on file.  Educational handout given for survey  Remus Loffler PA-C [facility] [address]  [place], [place] [id number] [phone]   05/09/2017, 3:04 PM

## 2017-05-10 ENCOUNTER — Encounter: Payer: Self-pay | Admitting: Physician Assistant

## 2017-05-10 LAB — CHLAMYDIA/GONOCOCCUS/TRICHOMONAS, NAA
Chlamydia by NAA: NEGATIVE
Gonococcus by NAA: NEGATIVE
TRICH VAG BY NAA: NEGATIVE

## 2017-05-10 LAB — STD SCREEN (8)
HEP A IGM: NEGATIVE
HEP B C IGM: NEGATIVE
HEP B S AG: NEGATIVE
HIV Screen 4th Generation wRfx: NONREACTIVE
HSV 2 IgG, Type Spec: 14.9 index — ABNORMAL HIGH (ref 0.00–0.90)
Hep C Virus Ab: <0.1 s/co ratio (ref 0.0–0.9)
RPR Ser Ql: NONREACTIVE

## 2017-05-14 ENCOUNTER — Telehealth: Payer: Self-pay | Admitting: Physician Assistant

## 2017-05-14 NOTE — Telephone Encounter (Signed)
Patient aware of results.
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
## 2017-05-31 ENCOUNTER — Encounter (HOSPITAL_COMMUNITY): Payer: Self-pay | Admitting: Psychiatry

## 2017-05-31 ENCOUNTER — Ambulatory Visit (HOSPITAL_COMMUNITY): Payer: Medicaid Other | Admitting: Psychiatry

## 2017-05-31 ENCOUNTER — Telehealth (HOSPITAL_COMMUNITY): Payer: Self-pay | Admitting: Psychiatry

## 2017-05-31 VITALS — BP 109/76 | HR 80 | Ht 68.0 in | Wt 173.0 lb

## 2017-05-31 DIAGNOSIS — F0630 Mood disorder due to known physiological condition, unspecified: Secondary | ICD-10-CM | POA: Diagnosis not present

## 2017-05-31 DIAGNOSIS — F419 Anxiety disorder, unspecified: Secondary | ICD-10-CM

## 2017-05-31 DIAGNOSIS — Z818 Family history of other mental and behavioral disorders: Secondary | ICD-10-CM

## 2017-05-31 DIAGNOSIS — F4310 Post-traumatic stress disorder, unspecified: Secondary | ICD-10-CM | POA: Diagnosis not present

## 2017-05-31 DIAGNOSIS — F1099 Alcohol use, unspecified with unspecified alcohol-induced disorder: Secondary | ICD-10-CM

## 2017-05-31 DIAGNOSIS — Z811 Family history of alcohol abuse and dependence: Secondary | ICD-10-CM | POA: Diagnosis not present

## 2017-05-31 DIAGNOSIS — Z813 Family history of other psychoactive substance abuse and dependence: Secondary | ICD-10-CM | POA: Diagnosis not present

## 2017-05-31 DIAGNOSIS — F17210 Nicotine dependence, cigarettes, uncomplicated: Secondary | ICD-10-CM | POA: Diagnosis not present

## 2017-05-31 DIAGNOSIS — G4700 Insomnia, unspecified: Secondary | ICD-10-CM | POA: Diagnosis not present

## 2017-05-31 DIAGNOSIS — R450 Nervousness: Secondary | ICD-10-CM

## 2017-05-31 MED ORDER — ARIPIPRAZOLE 10 MG PO TABS: ORAL_TABLET | ORAL | 1 refills | Status: DC

## 2017-05-31 NOTE — Patient Instructions (Signed)
1. Discontinue latuda 2. Start Abilify 5 mg daily for two weeks, then 10 mg daily  3. Return to clinic in six weeks for 30 mins 4. Discontinue trazodone
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
## 2017-06-04 ENCOUNTER — Ambulatory Visit (HOSPITAL_COMMUNITY): Payer: Medicaid Other | Admitting: Psychiatry

## 2017-06-04 ENCOUNTER — Encounter (HOSPITAL_COMMUNITY): Payer: Self-pay | Admitting: Psychiatry

## 2017-06-04 DIAGNOSIS — F0630 Mood disorder due to known physiological condition, unspecified: Secondary | ICD-10-CM | POA: Diagnosis not present
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
## 2017-06-11 ENCOUNTER — Ambulatory Visit (INDEPENDENT_AMBULATORY_CARE_PROVIDER_SITE_OTHER): Payer: Medicaid Other | Admitting: Psychiatry

## 2017-06-11 ENCOUNTER — Encounter (HOSPITAL_COMMUNITY): Payer: Self-pay | Admitting: Psychiatry

## 2017-06-11 DIAGNOSIS — F0630 Mood disorder due to known physiological condition, unspecified: Secondary | ICD-10-CM
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
## 2017-06-18 ENCOUNTER — Ambulatory Visit (HOSPITAL_COMMUNITY): Payer: Self-pay | Admitting: Psychiatry

## 2017-07-10 ENCOUNTER — Ambulatory Visit (INDEPENDENT_AMBULATORY_CARE_PROVIDER_SITE_OTHER): Payer: Medicaid Other | Admitting: *Deleted

## 2017-07-10 DIAGNOSIS — Z23 Encounter for immunization: Secondary | ICD-10-CM

## 2017-07-10 NOTE — Progress Notes (Signed)
PPD placed L arm Pt tolerated well 

## 2017-07-12 LAB — TB SKIN TEST
Induration: 0 mm
TB SKIN TEST: NEGATIVE
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
## 2017-07-13 ENCOUNTER — Ambulatory Visit (HOSPITAL_COMMUNITY): Payer: Self-pay | Admitting: Psychiatry

## 2017-07-16 ENCOUNTER — Ambulatory Visit (HOSPITAL_COMMUNITY): Payer: Medicaid Other | Admitting: Psychiatry

## 2017-07-16 ENCOUNTER — Encounter (HOSPITAL_COMMUNITY): Payer: Self-pay | Admitting: Psychiatry

## 2017-07-16 VITALS — BP 102/66 | HR 80 | Ht 68.0 in | Wt 186.0 lb

## 2017-07-16 DIAGNOSIS — F17210 Nicotine dependence, cigarettes, uncomplicated: Secondary | ICD-10-CM | POA: Diagnosis not present

## 2017-07-16 DIAGNOSIS — R450 Nervousness: Secondary | ICD-10-CM

## 2017-07-16 DIAGNOSIS — Z811 Family history of alcohol abuse and dependence: Secondary | ICD-10-CM | POA: Diagnosis not present

## 2017-07-16 DIAGNOSIS — Z915 Personal history of self-harm: Secondary | ICD-10-CM

## 2017-07-16 DIAGNOSIS — F0630 Mood disorder due to known physiological condition, unspecified: Secondary | ICD-10-CM

## 2017-07-16 DIAGNOSIS — Z813 Family history of other psychoactive substance abuse and dependence: Secondary | ICD-10-CM | POA: Diagnosis not present

## 2017-07-16 DIAGNOSIS — G4700 Insomnia, unspecified: Secondary | ICD-10-CM | POA: Diagnosis not present

## 2017-07-16 DIAGNOSIS — Z560 Unemployment, unspecified: Secondary | ICD-10-CM

## 2017-07-16 DIAGNOSIS — F419 Anxiety disorder, unspecified: Secondary | ICD-10-CM | POA: Diagnosis not present

## 2017-07-16 DIAGNOSIS — F4310 Post-traumatic stress disorder, unspecified: Secondary | ICD-10-CM | POA: Diagnosis not present

## 2017-07-16 DIAGNOSIS — F1290 Cannabis use, unspecified, uncomplicated: Secondary | ICD-10-CM | POA: Diagnosis not present

## 2017-07-16 DIAGNOSIS — Z818 Family history of other mental and behavioral disorders: Secondary | ICD-10-CM

## 2017-07-16 MED ORDER — RISPERIDONE 0.5 MG PO TABS: 0.5000 mg | ORAL_TABLET | Freq: Every day | ORAL | 0 refills | Status: DC

## 2017-07-16 NOTE — Patient Instructions (Signed)
1. Start risperidone 0.5 mg at night  2. Return to clinic in one month for 30 mins

## 2017-07-18 ENCOUNTER — Telehealth (HOSPITAL_COMMUNITY): Payer: Self-pay | Admitting: Psychiatry

## 2017-07-18 NOTE — Telephone Encounter (Signed)
Received UDS result 07/2016. Only positive for cotinine.

## 2017-07-26 ENCOUNTER — Ambulatory Visit (HOSPITAL_COMMUNITY): Payer: Self-pay | Admitting: Psychiatry

## 2017-08-06 NOTE — Progress Notes (Signed)
Subjective: CC: UTi PCP: Remus Loffler, PA-C [id number] Tara Colon is a 27 y.o. female presenting to clinic today for:  1. Urinary symptoms Patient reports a 3 day h/o urinary frequency and urgency.  She notes bladder pressure .  Denies hematuria, fevers, chills, abdominal pain, nausea, vomiting, back pain.  Patient has used nothing for symptoms.  Patient denies a h/o frequent or recurrent UTIs.    2. Mood disorder/ sleep problem Patient reports that she is currently seeing a psychiatrist and has an appointment coming up in about a week.  She notes that she has been placed on Risperdal but that she started developing a headache from the medication and self discontinued.  She notes poor sleep.  She voices concern about the care that she is getting and feels like she is at her wits end.  She notes that she does feel like sometimes she would be better off dead but has no suicidal intent.  No homicidal intent.  ROS: Per HPI  No Known Allergies Past Medical History:  Diagnosis Date  . Anxiety   . Bipolar affect, depressed (HCC)   . BV (bacterial vaginosis)   . Depression   . Insomnia     Current Outpatient Medications:  .  risperiDONE (RISPERDAL) 0.5 MG tablet, Take 1 tablet (0.5 mg total) by mouth daily., Disp: 30 tablet, Rfl: 0 Social History   Socioeconomic History  . Marital status: Single    Spouse name: Not on file  . Number of children: Not on file  . Years of education: Not on file  . Highest education level: Not on file  Occupational History  . Not on file  Social Needs  . Financial resource strain: Not on file  . Food insecurity:    Worry: Not on file    Inability: Not on file  . Transportation needs:    Medical: Not on file    Non-medical: Not on file  Tobacco Use  . Smoking status: Current Every Day Smoker    Packs/day: 1.00    Years: 9.00    Pack years: 9.00    Types: Cigarettes  . Smokeless tobacco: Never Used  Substance and Sexual Activity  . Alcohol use:  Yes    Alcohol/week: 8.4 oz    Types: 14 Cans of beer per week    Comment: 24 oz of beer per day  . Drug use: Yes    Types: Marijuana    Comment: 5 blunts per month  . Sexual activity: Yes    Birth control/protection: None  Lifestyle  . Physical activity:    Days per week: Not on file    Minutes per session: Not on file  . Stress: Not on file  Relationships  . Social connections:    Talks on phone: Not on file    Gets together: Not on file    Attends religious service: Not on file    Active member of club or organization: Not on file    Attends meetings of clubs or organizations: Not on file    Relationship status: Not on file  . Intimate partner violence:    Fear of current or ex partner: Not on file    Emotionally abused: Not on file    Physically abused: Not on file    Forced sexual activity: Not on file  Other Topics Concern  . Not on file  Social History Narrative  . Not on file   Family History  Problem Relation Age of Onset  .  Anxiety disorder Father   . Depression Father   . Bipolar disorder Father   . Diabetes Maternal Grandmother   . Drug abuse Paternal Aunt   . Alcohol abuse Paternal Aunt   . Drug abuse Paternal Uncle   . Alcohol abuse Paternal Uncle   . Alcohol abuse Cousin   . Drug abuse Cousin     Objective: Office vital signs reviewed. BP 107/72   Pulse (!) 56   Temp 98.9 F (37.2 C) (Oral)   Ht  (1.727 m)   Wt 188 lb (85.3 kg)   BMI 28.59 kg/m   Physical Examination:  General: Awake, alert, well nourished, nontoxic, No acute distress HEENT: sclera white, MMM Pulm: normal work of breathing on room air GI: soft, non-tender, non-distended, bowel sounds present x4, no hepatomegaly, no splenomegaly, no masses GU: no suprapubic TTP, no CVA TTP Psych: Mood stable, affect appropriate, eye contact fair, does not appear to be responding to internal stimuli. Depression screen [facility] 2/9 08/07/2017 05/09/2017 04/11/2017  Decreased Interest Down, Depressed, Hopeless PHQ - 2 Score Altered sleeping Tired, decreased energy Change in appetite Feeling bad or failure about yourself  Trouble concentrating 3 0 0  Moving slowly or fidgety/restless 0 0 0  Suicidal thoughts 3 0 0  PHQ-9 Score Difficult doing work/chores - - -   Assessment/ Plan: 27 y.o. female   1. Acute cystitis without hematuria Patient is nontoxic-appearing on exam.  She is afebrile.  Her urinalysis with trace lysed blood and 1+ protein.  Urine micro with moderate amounts of bacteria and mucus threads present.  I placed an order for a urine culture.  I started her on Keflex 500 mg p.o. twice daily for the next 5 days.  She is also been sent in a Diflucan tablet to use if needed for yeast infection. - Urinalysis, Complete - Urine Culture  2. Mood disorder in conditions classified elsewhere Her screening questionnaire was higher than previous today.  Particularly, she scored a 3 on suicidal thoughts.  She voiced no suicidal plan or intent but does feel that she would "be better off dead" some days.  She is already plugged into psychiatry but I placed an internal referral for virtual behavioral health to reach out to the patient.  If we can get a sooner appointment with her psychiatrist this would be ideal.  I do not think that she poses a danger to herself or others at this time.  She was discharged home.   Orders Placed This Encounter  Procedures  . Urine Culture  . Urinalysis, Complete   Meds ordered this encounter  Medications  . cephALEXin (KEFLEX) 500 MG capsule    Sig: Take 1 capsule (500 mg total) by mouth 2 (two) times daily for 5 days.    Dispense:  10 capsule    Refill:  0  . fluconazole (DIFLUCAN) 150 MG tablet    Sig: Take 1 tablet (150 mg total) by mouth once for 1 dose.    Dispense:  1 tablet    Refill:  0     Saleemah Mollenhauer Hulen Skains, DO [facility] [phone]

## 2017-08-07 ENCOUNTER — Telehealth: Payer: Self-pay | Admitting: Licensed Clinical Social Worker

## 2017-08-07 ENCOUNTER — Encounter: Payer: Self-pay | Admitting: Family Medicine

## 2017-08-07 ENCOUNTER — Ambulatory Visit: Payer: Medicaid Other | Admitting: Family Medicine

## 2017-08-07 VITALS — BP 107/72 | HR 56 | Temp 98.9°F | Ht 68.0 in | Wt 188.0 lb

## 2017-08-07 DIAGNOSIS — F0630 Mood disorder due to known physiological condition, unspecified: Secondary | ICD-10-CM

## 2017-08-07 DIAGNOSIS — N3000 Acute cystitis without hematuria: Secondary | ICD-10-CM | POA: Diagnosis not present

## 2017-08-07 LAB — URINALYSIS, COMPLETE
BILIRUBIN UA: NEGATIVE
Glucose, UA: NEGATIVE
KETONES UA: NEGATIVE
LEUKOCYTES UA: NEGATIVE
NITRITE UA: NEGATIVE
PH UA: 5.5 (ref 5.0–7.5)
Specific Gravity, UA: >1.03 — ABNORMAL HIGH (ref 1.005–1.030)
Urobilinogen, Ur: 0.2 mg/dL (ref 0.2–1.0)

## 2017-08-07 LAB — MICROSCOPIC EXAMINATION: RENAL EPITHEL UA: NONE SEEN /HPF

## 2017-08-07 MED ORDER — CEPHALEXIN 500 MG PO CAPS: 500.0000 mg | ORAL_CAPSULE | Freq: Two times a day (BID) | ORAL | 0 refills | Status: AC

## 2017-08-07 MED ORDER — FLUCONAZOLE 150 MG PO TABS: 150.0000 mg | ORAL_TABLET | Freq: Once | ORAL | 0 refills | Status: AC

## 2017-08-07 NOTE — BH Specialist Note (Signed)
[facility] Initial Clinical Assessment  MRN: [id number] NAME: Tara Colon Date: 08/07/17  Start time: 250p End time: 310pm Total time: 20 minutes  Type of Contact: Type of Contact: Phone Call Initial Contact Patient consent obtained: Patient consent obtained for Virtual Visit: Yes Reason for Visit today: Reason for Your Call/Visit Today: My medications are not right. I stopped taking them since they gave me a headache.  Treatment History Patient recently received Inpatient Treatment: Have You Recently Been in Any Inpatient Treatment (Colon/Detox/Crisis Colon/28-Day Program)?: No  Facility/Program:    Date of discharge:   Patient currently being seen by therapist/psychiatrist: Do You Currently Have a Therapist/Psychiatrist?: Yes Patient currently receiving the following services: Patient Currently Receiving the Following Services:: Medication Management, Individual Therapy  Past Psychiatric History/Hospitalization(s): Anxiety: No Bipolar Disorder: Yes, diagnosed a few months ago.  Has attended two therapy session with Tara Colon.   Depression: No Mania: No Psychosis: No Schizophrenia: No Personality Disorder: No Hospitalization for psychiatric illness: No History of Electroconvulsive Shock Therapy: No Prior Suicide Attempts: No  Clinical Assessment:  PHQ-9 Assessments: Depression screen [facility] 2/9 08/07/2017 05/09/2017 04/11/2017  Decreased Interest Down, Depressed, Hopeless PHQ - 2 Score Altered sleeping Tired, decreased energy Change in appetite Feeling bad or failure about yourself  Trouble concentrating 3 0 0  Moving slowly or fidgety/restless 0 0 0  Suicidal thoughts 3 0 0  PHQ-9 Score Difficult doing work/chores - - -    GAD-7 Assessments: No flowsheet data found.   Social Functioning Social maturity: Social Maturity: Irresponsible Social judgement:    Stress Current stressors:  Current Stressors: Unknown cause Familial stressors: Familial Stressors: None Sleep: Sleep: No problems Appetite: Appetite: No problems Coping ability: Coping ability: Overwhelmed Patient taking medications as prescribed: Patient taking medications as prescribed: No(I stopped taking the medication that the Psychiatrist prescribed since it was giving me a headache.)  Current medications:  Outpatient Encounter Medications as of 08/07/2017  Medication Sig  . cephALEXin (KEFLEX) 500 MG capsule Take 1 capsule (500 mg total) by mouth 2 (two) times daily for 5 days.  . fluconazole (DIFLUCAN) 150 MG tablet Take 1 tablet (150 mg total) by mouth once for 1 dose.   No facility-administered encounter medications on file as of 08/07/2017.     Self-harm Behaviors Risk Assessment Self-harm risk factors:   Patient endorses recent thoughts of harming self: Have you recently had any thoughts about harming yourself?: No  [place] Suicide Severity Rating Scale: No flowsheet data found.  Danger to Others Risk Assessment Danger to others risk factors: Danger to Others Risk Factors: No risk factors noted Patient endorses recent thoughts of harming others: Notification required: No need or identified person  Dynamic Appraisal of Situational Aggression (DASA): No flowsheet data found.  Substance Use Assessment Patient recently consumed alcohol:    Alcohol Use Disorder Identification Test (AUDIT): No flowsheet data found. Patient recently used drugs:    Opioid Risk Assessment:  Patient is concerned about dependence or abuse of substances:    ASAM Multidimensional Assessment Summary:  Dimension 1:    Dimension 1 Rating:    Dimension 2:    Dimension 2 Rating:    Dimension 3:    Dimension 3 Rating:    Dimension 4:    Dimension 4 Rating:    Dimension 5:  Dimension 5 Rating:    Dimension 6:    Dimension 6 Rating:   ASAM's Severity Rating Score:   ASAM Recommended Level of Treatment:    Decreased  need for sleep: No  Euphoria: No  Self Injurious behaviors: No  Family History of mental illness: Yes (father, bipolar) Family History of substance abuse: Yes (father) Substance Abuse: No  DUI: No  Insomnia: No   History of violence: No  Physical, sexual or emotional abuse: No  Prior outpatient mental health therapy: Yes  Goals, Interventions and Follow-up Plan Goals: Improve medication compliance Interventions: Brief CBT Follow-up Plan: Refer to [facility] as Tara Colon is currently connected. She has an appointment with Therapist on Aug 09 2017 at 10am.  Summary of Clinical Assessment Summary: Tara Colon is a 27 year old female who was referred to [facility] by Tara Colon due to her recent decision to stop taking her medication.  Tara Colon wants to obtain treatment and reduce her symptoms. Tara Colon is motivated to address her unhealthy thinking habits that she has developed over the past few years.  Tara Colon has a family history of mental illness and substance abuse. Tara Colon is open to scheduling community based psycho therapy.  Tara Colon has had two sessions with [facility] at [facility] in [place]. Tara Colon has a scheduled appointment with her Psychiatrist on Aug 16, 2017 and an appointment with her Therapist on Aug 09, 2017.    [facility] will follow up with Tara Colon to ensure follow through on her scheduled appointment with therapist.   Tara Elk, LCSW

## 2017-08-07 NOTE — Patient Instructions (Signed)

## 2017-08-08 LAB — URINE CULTURE

## 2017-08-09 ENCOUNTER — Ambulatory Visit (HOSPITAL_COMMUNITY): Payer: Self-pay | Admitting: Psychiatry
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
## 2017-08-16 ENCOUNTER — Ambulatory Visit (INDEPENDENT_AMBULATORY_CARE_PROVIDER_SITE_OTHER): Payer: Medicaid Other | Admitting: Psychiatry

## 2017-08-16 ENCOUNTER — Encounter (HOSPITAL_COMMUNITY): Payer: Self-pay | Admitting: Psychiatry

## 2017-08-16 ENCOUNTER — Ambulatory Visit (HOSPITAL_COMMUNITY): Payer: Self-pay | Admitting: Psychiatry

## 2017-08-16 VITALS — BP 102/68 | HR 69 | Ht 68.0 in | Wt 188.0 lb

## 2017-08-16 DIAGNOSIS — F17210 Nicotine dependence, cigarettes, uncomplicated: Secondary | ICD-10-CM | POA: Diagnosis not present

## 2017-08-16 DIAGNOSIS — G4700 Insomnia, unspecified: Secondary | ICD-10-CM | POA: Diagnosis not present

## 2017-08-16 DIAGNOSIS — F0630 Mood disorder due to known physiological condition, unspecified: Secondary | ICD-10-CM

## 2017-08-16 DIAGNOSIS — Z813 Family history of other psychoactive substance abuse and dependence: Secondary | ICD-10-CM | POA: Diagnosis not present

## 2017-08-16 DIAGNOSIS — F4310 Post-traumatic stress disorder, unspecified: Secondary | ICD-10-CM | POA: Diagnosis not present

## 2017-08-16 DIAGNOSIS — Z818 Family history of other mental and behavioral disorders: Secondary | ICD-10-CM | POA: Diagnosis not present

## 2017-08-16 DIAGNOSIS — R45851 Suicidal ideations: Secondary | ICD-10-CM | POA: Diagnosis not present

## 2017-08-16 DIAGNOSIS — Z811 Family history of alcohol abuse and dependence: Secondary | ICD-10-CM

## 2017-08-16 DIAGNOSIS — F419 Anxiety disorder, unspecified: Secondary | ICD-10-CM | POA: Diagnosis not present

## 2017-08-16 MED ORDER — DIVALPROEX SODIUM ER 500 MG PO TB24: 500.0000 mg | ORAL_TABLET | Freq: Every day | ORAL | 0 refills | Status: DC

## 2017-08-16 NOTE — Patient Instructions (Signed)
1. Discontinue risperidone 2. Start Depakote 500 mg at night  3. Obtain blood test after 6/5 (VPA, CBC, CMP) 4. Return to clinic in one month for 30 mins 5. [facility] [place] [phone], [place], [place] [address] 6. Contact [facility] for vocational rehabilitation
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
## 2017-08-17 ENCOUNTER — Telehealth (HOSPITAL_COMMUNITY): Payer: Self-pay | Admitting: Psychiatry

## 2017-08-17 DIAGNOSIS — F0630 Mood disorder due to known physiological condition, unspecified: Secondary | ICD-10-CM

## 2017-08-17 LAB — COMPREHENSIVE METABOLIC PANEL
AG RATIO: 1.5 (calc) (ref 1.0–2.5)
ALT: 12 U/L (ref 6–29)
AST: 16 U/L (ref 10–30)
Albumin: 4.3 g/dL (ref 3.6–5.1)
Alkaline phosphatase (APISO): 83 U/L (ref 33–115)
BILIRUBIN TOTAL: 0.4 mg/dL (ref 0.2–1.2)
BUN: 13 mg/dL (ref 7–25)
CALCIUM: 10 mg/dL (ref 8.6–10.2)
CHLORIDE: 106 mmol/L (ref 98–110)
CO2: 28 mmol/L (ref 20–32)
Creat: 0.73 mg/dL (ref 0.50–1.10)
GLOBULIN: 2.9 g/dL (ref 1.9–3.7)
GLUCOSE: 96 mg/dL (ref 65–139)
POTASSIUM: 5.3 mmol/L (ref 3.5–5.3)
SODIUM: 141 mmol/L (ref 135–146)
TOTAL PROTEIN: 7.2 g/dL (ref 6.1–8.1)

## 2017-08-17 LAB — VALPROIC ACID LEVEL: Valproic Acid Lvl: <12.5 mg/L — ABNORMAL LOW (ref 50.0–100.0)

## 2017-08-17 LAB — CBC
HCT: 41.6 % (ref 35.0–45.0)
Hemoglobin: 13.9 g/dL (ref 11.7–15.5)
MCH: 30.8 pg (ref 27.0–33.0)
MCHC: 33.4 g/dL (ref 32.0–36.0)
MCV: 92 fL (ref 80.0–100.0)
MPV: 9.5 fL (ref 7.5–12.5)
PLATELETS: 274 10*3/uL (ref 140–400)
RBC: 4.52 10*6/uL (ref 3.80–5.10)
RDW: 12.3 % (ref 11.0–15.0)
WBC: 5.1 10*3/uL (ref 3.8–10.8)

## 2017-08-17 NOTE — Telephone Encounter (Signed)
Done

## 2017-08-17 NOTE — Telephone Encounter (Signed)
Please contact the patient. It seems like she draw a blood test yesterday, which was scheduled to be done next week. We need to get another blood draw for depakote level. Advise her to do it when she comes to this clinic to see Peggy (6/6). Print out the order and give it to her when she comes to the clinic.

## 2017-08-23 ENCOUNTER — Ambulatory Visit (HOSPITAL_COMMUNITY): Payer: Medicaid Other | Admitting: Psychiatry

## 2017-09-13 ENCOUNTER — Encounter: Payer: Self-pay | Admitting: Pediatrics

## 2017-09-13 ENCOUNTER — Ambulatory Visit (INDEPENDENT_AMBULATORY_CARE_PROVIDER_SITE_OTHER): Payer: Medicaid Other | Admitting: Pediatrics

## 2017-09-13 ENCOUNTER — Ambulatory Visit (HOSPITAL_COMMUNITY): Payer: Medicaid Other | Admitting: Psychiatry

## 2017-09-13 VITALS — BP 106/70 | HR 79 | Temp 98.2°F | Ht 68.0 in | Wt 191.8 lb

## 2017-09-13 DIAGNOSIS — R399 Unspecified symptoms and signs involving the genitourinary system: Secondary | ICD-10-CM | POA: Diagnosis not present

## 2017-09-13 DIAGNOSIS — N3090 Cystitis, unspecified without hematuria: Secondary | ICD-10-CM

## 2017-09-13 LAB — URINALYSIS, COMPLETE
BILIRUBIN UA: NEGATIVE
Glucose, UA: NEGATIVE
Leukocytes, UA: NEGATIVE
NITRITE UA: NEGATIVE
PH UA: 6 (ref 5.0–7.5)
Specific Gravity, UA: >1.03 — ABNORMAL HIGH (ref 1.005–1.030)
UUROB: 0.2 mg/dL (ref 0.2–1.0)

## 2017-09-13 LAB — MICROSCOPIC EXAMINATION: RENAL EPITHEL UA: NONE SEEN /HPF

## 2017-09-13 MED ORDER — NITROFURANTOIN MONOHYD MACRO 100 MG PO CAPS: 100.0000 mg | ORAL_CAPSULE | Freq: Two times a day (BID) | ORAL | 0 refills | Status: AC

## 2017-09-13 NOTE — Progress Notes (Signed)
  Subjective:   Patient ID: Tara Colon, female    DOB: 20-Dec-1990, 27 y.o.   MRN: [id number] CC: Burning with urinatiCherlynn Colon  HPI: Tara Colon is a 27 y.o. female   Started several days ago after bath with small amount of Clorox.  Has had burning with urination since then.  Some lower abdominal pain that comes and goes.  Feels like prior urinary tract infections.  No fevers.  Appetite is been fine.  No recent new sexual partners.  Denies any vaginal discharge.  Relevant past medical, surgical, family and social history reviewed. Allergies and medications reviewed and updated. Social History   Tobacco Use  Smoking Status Current Every Day Smoker  . Packs/day: 1.00  . Years: 9.00  . Pack years: 9.00  . Types: Cigarettes  Smokeless Tobacco Never Used   ROS: Per HPI   Objective:    BP 106/70   Pulse 79   Temp 98.2 F (36.8 C) (Oral)   Ht 5\' 8"  (1.727 m)   Wt 191 lb 12.8 oz (87 kg)   BMI 29.16 kg/m   Wt Readings from Last 3 Encounters:  09/13/17 191 lb 12.8 oz (87 kg)  08/07/17 188 lb (85.3 kg)  05/09/17 176 lb 6.4 oz (80 kg)    Gen: NAD, alert, cooperative with exam, NCAT EYES: EOMI, no conjunctival injection, or no icterus CV: NRRR, normal S1/S2, no murmur, distal pulses 2+ b/l Resp: CTABL, no wheezes, normal WOB Abd: +BS, soft, NTND. no guarding or organomegaly, no CVA tenderness Ext: No edema, warm Neuro: Alert and oriented MSK: normal muscle bulk  Assessment & Plan:  Tara Colon was seen today for burning with urination.  Diagnoses and all orders for this visit:  Cystitis Treat below based on symptoms.  Will follow-up final urine culture.  Avoid sit baths with any irritating substances soaps or fragrances.  Increase fluid intake, concentrated urine.  New proteinuria present last couple checks.  Will have patient return to clinic for recheck in 2 to 3 weeks when symptoms resolved and she is better hydrated. -     nitrofurantoin, macrocrystal-monohydrate, (MACROBID) 100  MG capsule; Take 1 capsule (100 mg total) by mouth 2 (two) times daily for 3 days.  UTI symptoms -     Urinalysis, Complete -     Urine Culture; Future -     Urine Culture  Other orders -     Microscopic Examination   Follow up plan: Return in about 3 weeks (around 10/04/2017) for follow up proteinuria. Rex Krasarol Michaella Imai, MD [facility]

## 2017-09-14 LAB — URINE CULTURE

## 2017-09-17 ENCOUNTER — Encounter: Payer: Self-pay | Admitting: Physician Assistant

## 2017-09-17 ENCOUNTER — Ambulatory Visit: Payer: Medicaid Other | Admitting: Physician Assistant

## 2017-09-17 ENCOUNTER — Ambulatory Visit (HOSPITAL_COMMUNITY): Payer: Medicaid Other | Admitting: Psychiatry

## 2017-09-17 VITALS — BP 104/67 | HR 75 | Temp 97.6°F | Ht 68.0 in | Wt 191.0 lb

## 2017-09-17 DIAGNOSIS — N760 Acute vaginitis: Secondary | ICD-10-CM | POA: Diagnosis not present

## 2017-09-17 DIAGNOSIS — R300 Dysuria: Secondary | ICD-10-CM

## 2017-09-17 DIAGNOSIS — R399 Unspecified symptoms and signs involving the genitourinary system: Secondary | ICD-10-CM

## 2017-09-17 DIAGNOSIS — B9689 Other specified bacterial agents as the cause of diseases classified elsewhere: Secondary | ICD-10-CM

## 2017-09-17 LAB — URINALYSIS, COMPLETE
BILIRUBIN UA: NEGATIVE
GLUCOSE, UA: NEGATIVE
LEUKOCYTES UA: NEGATIVE
Nitrite, UA: NEGATIVE
SPEC GRAV UA: 1.025 (ref 1.005–1.030)
Urobilinogen, Ur: 1 mg/dL (ref 0.2–1.0)
pH, UA: 6 (ref 5.0–7.5)

## 2017-09-17 LAB — MICROSCOPIC EXAMINATION: Renal Epithel, UA: NONE SEEN /hpf

## 2017-09-17 MED ORDER — FLUCONAZOLE 150 MG PO TABS: ORAL_TABLET | ORAL | 0 refills | Status: DC

## 2017-09-17 NOTE — Patient Instructions (Signed)
In a few days you may receive a survey in the mail or online from [employer] regarding your visit with us today. Please take a moment to fill this out. Your feedback is very important to our whole office. It can help us better understand your needs as well as improve your experience and satisfaction. Thank you for taking your time to complete it. We care about you.  Mahlik Lenn, PA-C  

## 2017-09-18 LAB — URINE CULTURE

## 2017-09-18 NOTE — Progress Notes (Signed)
BP 104/67   Pulse 75   Temp 97.6 F (36.4 C) (Oral)   Ht 5\' 8"  (1.727 m)   Wt 191 lb (86.6 kg)   BMI 29.04 kg/m    Subjective:    Patient ID: Tara Colon, female    DOB: [birth year], 27 y.o.   MRN: [id number]  HPI: Tara Colon is a 27 y.o. female presenting on 09/17/2017 for Burning when urinates; Dysuria; and Vaginal Discharge  This patient comes in for continued problems of dysuria.  She was treated for a urinary tract infection with 3 days of Macrobid.  She only has burning whenever she urinates.  She denies any change to her urine.  She has some slight white discharge without odor.  She denies any blood.  She has a little bit of itching but not a significant amount.  She has used a soaking bath and wonders if that has irritated things.  Past Medical History:  Diagnosis Date  . Anxiety   . Bipolar affect, depressed (HCC)   . BV (bacterial vaginosis)   . Depression   . Insomnia    Relevant past medical, surgical, family and social history reviewed and updated as indicated. Interim medical history since our last visit reviewed. Allergies and medications reviewed and updated. DATA REVIEWED: CHART IN EPIC  Family History reviewed for pertinent findings.  Review of Systems  Constitutional: Negative.   HENT: Negative.   Eyes: Negative.   Respiratory: Negative.   Gastrointestinal: Negative.   Genitourinary: Positive for dysuria and vaginal discharge. Negative for difficulty urinating, frequency and hematuria.    Allergies as of 09/17/2017   No Known Allergies     Medication List        Accurate as of 09/17/17 11:59 PM. Always use your most recent med list.          fluconazole 150 MG tablet Commonly known as:  DIFLUCAN 1 po q week x 4 weeks          Objective:    BP 104/67   Pulse 75   Temp 97.6 F (36.4 C) (Oral)   Ht 5\' 8"  (1.727 m)   Wt 191 lb (86.6 kg)   BMI 29.04 kg/m   No Known Allergies  Wt Readings from Last 3 Encounters:  09/17/17 191  lb (86.6 kg)  09/13/17 191 lb 12.8 oz (87 kg)  08/07/17 188 lb (85.3 kg)    Physical Exam  Constitutional: She is oriented to person, place, and time. She appears well-developed and well-nourished.  HENT:  Head: Normocephalic and atraumatic.  Eyes: Pupils are equal, round, and reactive to light. Conjunctivae and EOM are normal.  Cardiovascular: Normal rate, regular rhythm, normal heart sounds and intact distal pulses.  Pulmonary/Chest: Effort normal and breath sounds normal.  Abdominal: Soft. Bowel sounds are normal.  Genitourinary: There is no rash on the right labia. There is no rash on the left labia. No erythema, tenderness or bleeding in the vagina. Vaginal discharge found.  Genitourinary Comments: Normal to slightly thickened white discharge.  Endocervical sample obtained.  Neurological: She is alert and oriented to person, place, and time. She has normal reflexes.  Skin: Skin is warm and dry. No rash noted.  Psychiatric: She has a normal mood and affect. Her behavior is normal. Judgment and thought content normal.    Results for orders placed or performed in visit on 09/17/17  Microscopic Examination  Result Value Ref Range   WBC, UA 0-5 0 - 5 /hpf  RBC, UA 0-2 0 - 2 /hpf   Epithelial Cells (non renal) 0-10 0 - 10 /hpf   Renal Epithel, UA None seen None seen /hpf   Bacteria, UA Few (A) None seen/Few  Urinalysis, Complete  Result Value Ref Range   Specific Gravity, UA 1.025 1.005 - 1.030   pH, UA 6.0 5.0 - 7.5   Color, UA Amber (A) Yellow   Appearance Ur Clear Clear   Leukocytes, UA Negative Negative   Protein, UA 1+ (A) Negative/Trace   Glucose, UA Negative Negative   Ketones, UA 1+ (A) Negative   RBC, UA Trace (A) Negative   Bilirubin, UA Negative Negative   Urobilinogen, Ur 1.0 0.2 - 1.0 mg/dL   Nitrite, UA Negative Negative   Microscopic Examination See below:       Assessment & Plan:   1. Dysuria - Urine Culture - Urinalysis, Complete - NuSwab Vaginitis  Plus (VG+)  2. UTI symptoms - NuSwab Vaginitis Plus (VG+)  3. Bacterial vaginosis - NuSwab Vaginitis Plus (VG+)   Continue all other maintenance medications as listed above.  Follow up plan: No follow-ups on file.  Educational handout given for survey  Remus LofflerAngel S. Yaremi Stahlman PA-C [facility] [address]  [place], [place] [id number] [phone]   09/18/2017, 9:21 AM

## 2017-09-20 LAB — NUSWAB VAGINITIS PLUS (VG+)
ATOPOBIUM VAGINAE: HIGH {score} — AB
Candida albicans, NAA: NEGATIVE
Candida glabrata, NAA: NEGATIVE
Chlamydia trachomatis, NAA: NEGATIVE
MEGASPHAERA 1: HIGH {score} — AB
Neisseria gonorrhoeae, NAA: NEGATIVE
Trich vag by NAA: NEGATIVE

## 2017-09-24 ENCOUNTER — Other Ambulatory Visit: Payer: Self-pay | Admitting: Physician Assistant

## 2017-09-24 MED ORDER — CLINDAMYCIN HCL 150 MG PO CAPS: 150.0000 mg | ORAL_CAPSULE | Freq: Four times a day (QID) | ORAL | 0 refills | Status: DC

## 2017-10-01 ENCOUNTER — Ambulatory Visit (HOSPITAL_COMMUNITY): Payer: Self-pay | Admitting: Psychiatry

## 2017-10-05 ENCOUNTER — Ambulatory Visit: Payer: Medicaid Other | Admitting: Pediatrics

## 2017-10-09 ENCOUNTER — Ambulatory Visit: Payer: Medicaid Other | Admitting: Physician Assistant

## 2017-11-08 DIAGNOSIS — H10013 Acute follicular conjunctivitis, bilateral: Secondary | ICD-10-CM | POA: Diagnosis not present

## 2017-11-08 DIAGNOSIS — H40033 Anatomical narrow angle, bilateral: Secondary | ICD-10-CM | POA: Diagnosis not present

## 2017-11-09 ENCOUNTER — Telehealth: Payer: Self-pay | Admitting: Physician Assistant

## 2017-11-09 ENCOUNTER — Encounter (INDEPENDENT_AMBULATORY_CARE_PROVIDER_SITE_OTHER): Payer: Medicaid Other | Admitting: Ophthalmology

## 2017-11-09 DIAGNOSIS — H33022 Retinal detachment with multiple breaks, left eye: Secondary | ICD-10-CM | POA: Diagnosis not present

## 2017-11-09 DIAGNOSIS — H35412 Lattice degeneration of retina, left eye: Secondary | ICD-10-CM | POA: Diagnosis not present

## 2017-11-09 NOTE — Telephone Encounter (Signed)
Advised patient to contact her surgeon's office for this information.Patient voices understanding.

## 2017-11-17 DIAGNOSIS — R070 Pain in throat: Secondary | ICD-10-CM | POA: Diagnosis not present

## 2017-11-23 ENCOUNTER — Encounter (INDEPENDENT_AMBULATORY_CARE_PROVIDER_SITE_OTHER): Payer: Medicaid Other | Admitting: Ophthalmology

## 2017-11-23 DIAGNOSIS — H33303 Unspecified retinal break, bilateral: Secondary | ICD-10-CM

## 2017-11-27 ENCOUNTER — Ambulatory Visit: Payer: Medicaid Other | Admitting: Physician Assistant

## 2017-11-27 ENCOUNTER — Encounter: Payer: Self-pay | Admitting: Physician Assistant

## 2017-11-27 VITALS — BP 106/72 | HR 86 | Temp 98.1°F | Ht 68.0 in | Wt 190.8 lb

## 2017-11-27 DIAGNOSIS — Z7251 High risk heterosexual behavior: Secondary | ICD-10-CM

## 2017-11-27 DIAGNOSIS — J209 Acute bronchitis, unspecified: Secondary | ICD-10-CM | POA: Diagnosis not present

## 2017-11-27 DIAGNOSIS — R197 Diarrhea, unspecified: Secondary | ICD-10-CM

## 2017-11-27 LAB — URINALYSIS, COMPLETE
Bilirubin, UA: NEGATIVE
GLUCOSE, UA: NEGATIVE
Leukocytes, UA: NEGATIVE
Nitrite, UA: NEGATIVE
Urobilinogen, Ur: 0.2 mg/dL (ref 0.2–1.0)
pH, UA: 5.5 (ref 5.0–7.5)

## 2017-11-27 LAB — MICROSCOPIC EXAMINATION: Renal Epithel, UA: NONE SEEN /hpf

## 2017-11-27 MED ORDER — DOXYCYCLINE HYCLATE 100 MG PO TABS: 100.0000 mg | ORAL_TABLET | Freq: Two times a day (BID) | ORAL | 0 refills | Status: DC

## 2017-11-27 MED ORDER — METRONIDAZOLE 0.75 % VA GEL: 1.0000 | Freq: Two times a day (BID) | VAGINAL | 2 refills | Status: DC

## 2017-11-27 MED ORDER — ALBUTEROL SULFATE HFA 108 (90 BASE) MCG/ACT IN AERS: 2.0000 | INHALATION_SPRAY | Freq: Four times a day (QID) | RESPIRATORY_TRACT | 2 refills | Status: DC | PRN

## 2017-11-27 NOTE — Progress Notes (Signed)
BP 106/72   Pulse 86   Temp 98.1 F (36.7 C) (Oral)   Ht 5\' 8"  (1.727 m)   Wt 190 lb 12.8 oz (86.5 kg)   BMI 29.01 kg/m    Subjective:    Patient ID: Tara Colon, female    DOB: [birth year], 27 y.o.   MRN: [id number]  HPI: Tara Colon is a 27 y.o. female presenting on 11/27/2017 for STD testing; Cough; and chest congestion  Patient states that she has a stated checked every 6 months.  So we will try to do that today.  Patient also reports that she is having diarrhea on a frequent basis.  She cannot related to any specific food.  Of asked her to keep a food diary of anything that seems to aggravate her.  She denies any fever or chills.  No nausea vomiting.   This patient has had many days of sore throat and postnasal drainage, headache at times and sinus pressure. There is copious drainage at times. Denies any fever at this time. There has been a history of sinus infections in the past.  There is cough at night. It has become more prevalent in recent days.   Past Medical History:  Diagnosis Date  . Anxiety   . Bipolar affect, depressed (HCC)   . BV (bacterial vaginosis)   . Depression   . Insomnia    Relevant past medical, surgical, family and social history reviewed and updated as indicated. Interim medical history since our last visit reviewed. Allergies and medications reviewed and updated. DATA REVIEWED: CHART IN EPIC  Family History reviewed for pertinent findings.  Review of Systems  Constitutional: Positive for fatigue. Negative for activity change, appetite change, chills and fever.  HENT: Positive for congestion, postnasal drip, sinus pressure and sore throat.   Eyes: Negative.   Respiratory: Positive for cough. Negative for wheezing.   Cardiovascular: Negative.  Negative for chest pain, palpitations and leg swelling.  Gastrointestinal: Negative.   Genitourinary: Negative.   Musculoskeletal: Negative.   Skin: Negative.   Neurological: Positive for headaches.      Allergies as of 11/27/2017   No Known Allergies     Medication List        Accurate as of 11/27/17  4:39 PM. Always use your most recent med list.          albuterol 108 (90 Base) MCG/ACT inhaler Commonly known as:  PROVENTIL HFA;VENTOLIN HFA Inhale 2 puffs into the lungs every 6 (six) hours as needed for wheezing or shortness of breath.   doxycycline 100 MG tablet Commonly known as:  VIBRA-TABS Take 1 tablet (100 mg total) by mouth 2 (two) times daily. 1 po bid   metroNIDAZOLE 0.75 % vaginal gel Commonly known as:  METROGEL Place 1 Applicatorful vaginally 2 (two) times daily.          Objective:    BP 106/72   Pulse 86   Temp 98.1 F (36.7 C) (Oral)   Ht 5\' 8"  (1.727 m)   Wt 190 lb 12.8 oz (86.5 kg)   BMI 29.01 kg/m   No Known Allergies  Wt Readings from Last 3 Encounters:  11/27/17 190 lb 12.8 oz (86.5 kg)  09/17/17 191 lb (86.6 kg)  09/13/17 191 lb 12.8 oz (87 kg)    Physical Exam  Constitutional: She is oriented to person, place, and time. She appears well-developed and well-nourished.  HENT:  Head: Normocephalic and atraumatic.  Right Ear: A middle ear effusion is present.  Left Ear: A middle ear effusion is present.  Nose: Mucosal edema present. Right sinus exhibits no frontal sinus tenderness. Left sinus exhibits no frontal sinus tenderness.  Mouth/Throat: Posterior oropharyngeal erythema present. No oropharyngeal exudate or tonsillar abscesses.  Eyes: Pupils are equal, round, and reactive to light. Conjunctivae and EOM are normal.  Neck: Normal range of motion.  Cardiovascular: Normal rate, regular rhythm, normal heart sounds and intact distal pulses.  Pulmonary/Chest: Effort normal and breath sounds normal.  Abdominal: Soft. Bowel sounds are normal.  Neurological: She is alert and oriented to person, place, and time. She has normal reflexes.  Skin: Skin is warm and dry. No rash noted.  Psychiatric: She has a normal mood and affect. Her behavior  is normal. Judgment and thought content normal.  Nursing note and vitals reviewed.   Results for orders placed or performed in visit on 11/27/17  Microscopic Examination  Result Value Ref Range   WBC, UA 0-5 0 - 5 /hpf   RBC, UA 3-10 (A) 0 - 2 /hpf   Epithelial Cells (non renal) 0-10 0 - 10 /hpf   Renal Epithel, UA None seen None seen /hpf   Mucus, UA Present Not Estab.   Bacteria, UA Few None seen/Few  Urinalysis, Complete  Result Value Ref Range   Specific Gravity, UA >1.030 (H) 1.005 - 1.030   pH, UA 5.5 5.0 - 7.5   Color, UA Yellow Yellow   Appearance Ur Clear Clear   Leukocytes, UA Negative Negative   Protein, UA 2+ (A) Negative/Trace   Glucose, UA Negative Negative   Ketones, UA Trace (A) Negative   RBC, UA 1+ (A) Negative   Bilirubin, UA Negative Negative   Urobilinogen, Ur 0.2 0.2 - 1.0 mg/dL   Nitrite, UA Negative Negative   Microscopic Examination See below:       Assessment & Plan:   1. High risk sexual behavior, unspecified type - Urine Culture - Urinalysis, Complete - STD Screen (8) - GC/Chlamydia Probe Amp([facility]) - Microscopic Examination  2. Acute bronchitis, unspecified organism - doxycycline (VIBRA-TABS) 100 MG tablet; Take 1 tablet (100 mg total) by mouth 2 (two) times daily. 1 po bid  Dispense: 20 tablet; Refill: 0 - albuterol (PROVENTIL HFA;VENTOLIN HFA) 108 (90 Base) MCG/ACT inhaler; Inhale 2 puffs into the lungs every 6 (six) hours as needed for wheezing or shortness of breath.  Dispense: 1 Inhaler; Refill: 2  3. Diarrhea, unspecified type **keep food diary and reactions to foods*   Continue all other maintenance medications as listed above.  Follow up plan: Return in about 4 weeks (around 12/25/2017) for recheck.  Educational handout given for survey  Remus Loffler PA-C [facility] [address]  [place], [place] [address] [phone]   11/27/2017, 4:39 PM

## 2017-11-27 NOTE — Progress Notes (Signed)
g

## 2017-11-28 LAB — STD SCREEN (8)
HEP B S AG: NEGATIVE
HIV SCREEN 4TH GENERATION: NONREACTIVE
HSV 2 IgG, Type Spec: 16.9 index — ABNORMAL HIGH (ref 0.00–0.90)
Hep A IgM: NEGATIVE
Hep B C IgM: NEGATIVE
Hep C Virus Ab: <0.1 s/co ratio (ref 0.0–0.9)
RPR: NONREACTIVE

## 2017-11-28 LAB — GC/CHLAMYDIA PROBE AMP
Chlamydia trachomatis, NAA: NEGATIVE
Neisseria gonorrhoeae by PCR: NEGATIVE

## 2017-11-28 LAB — URINE CULTURE

## 2017-12-20 DIAGNOSIS — Z7689 Persons encountering health services in other specified circumstances: Secondary | ICD-10-CM | POA: Diagnosis not present

## 2017-12-27 ENCOUNTER — Encounter: Payer: Self-pay | Admitting: Physician Assistant

## 2017-12-27 ENCOUNTER — Ambulatory Visit: Payer: Medicaid Other | Admitting: Physician Assistant

## 2017-12-27 VITALS — BP 111/72 | HR 78 | Temp 98.3°F | Ht 68.0 in | Wt 189.0 lb

## 2017-12-27 DIAGNOSIS — J209 Acute bronchitis, unspecified: Secondary | ICD-10-CM | POA: Diagnosis not present

## 2017-12-27 DIAGNOSIS — Z716 Tobacco abuse counseling: Secondary | ICD-10-CM

## 2017-12-27 MED ORDER — VARENICLINE TARTRATE 0.5 MG X 11 & 1 MG X 42 PO MISC: ORAL | 5 refills | Status: DC

## 2017-12-27 NOTE — Patient Instructions (Signed)
Steps to Quit Smoking Smoking tobacco can be harmful to your health and can affect almost every organ in your body. Smoking puts you, and those around you, at risk for developing many serious chronic diseases. Quitting smoking is difficult, but it is one of the best things that you can do for your health. It is never too late to quit. What are the benefits of quitting smoking? When you quit smoking, you lower your risk of developing serious diseases and conditions, such as:  Lung cancer or lung disease, such as COPD.  Heart disease.  Stroke.  Heart attack.  Infertility.  Osteoporosis and bone fractures.  Additionally, symptoms such as coughing, wheezing, and shortness of breath may get better when you quit. You may also find that you get sick less often because your body is stronger at fighting off colds and infections. If you are pregnant, quitting smoking can help to reduce your chances of having a baby of low birth weight. How do I get ready to quit? When you decide to quit smoking, create a plan to make sure that you are successful. Before you quit:  Pick a date to quit. Set a date within the next two weeks to give you time to prepare.  Write down the reasons why you are quitting. Keep this list in places where you will see it often, such as on your bathroom mirror or in your car or wallet.  Identify the people, places, things, and activities that make you want to smoke (triggers) and avoid them. Make sure to take these actions: ? Throw away all cigarettes at home, at work, and in your car. ? Throw away smoking accessories, such as ashtrays and lighters. ? Clean your car and make sure to empty the ashtray. ? Clean your home, including curtains and carpets.  Tell your family, friends, and coworkers that you are quitting. Support from your loved ones can make quitting easier.  Talk with your health care provider about your options for quitting smoking.  Find out what treatment  options are covered by your health insurance.  What strategies can I use to quit smoking? Talk with your healthcare provider about different strategies to quit smoking. Some strategies include:  Quitting smoking altogether instead of gradually lessening how much you smoke over a period of time. Research shows that quitting "cold turkey" is more successful than gradually quitting.  Attending in-person counseling to help you build problem-solving skills. You are more likely to have success in quitting if you attend several counseling sessions. Even short sessions of 10 minutes can be effective.  Finding resources and support systems that can help you to quit smoking and remain smoke-free after you quit. These resources are most helpful when you use them often. They can include: ? Online chats with a counselor. ? Telephone quitlines. ? Printed self-help materials. ? Support groups or group counseling. ? Text messaging programs. ? Mobile phone applications.  Taking medicines to help you quit smoking. (If you are pregnant or breastfeeding, talk with your health care provider first.) Some medicines contain nicotine and some do not. Both types of medicines help with cravings, but the medicines that include nicotine help to relieve withdrawal symptoms. Your health care provider may recommend: ? Nicotine patches, gum, or lozenges. ? Nicotine inhalers or sprays. ? Non-nicotine medicine that is taken by mouth.  Talk with your health care provider about combining strategies, such as taking medicines while you are also receiving in-person counseling. Using these two strategies together   makes you more likely to succeed in quitting than if you used either strategy on its own. If you are pregnant or breastfeeding, talk with your health care provider about finding counseling or other support strategies to quit smoking. Do not take medicine to help you quit smoking unless told to do so by your health care  provider. What things can I do to make it easier to quit? Quitting smoking might feel overwhelming at first, but there is a lot that you can do to make it easier. Take these important actions:  Reach out to your family and friends and ask that they support and encourage you during this time. Call telephone quitlines, reach out to support groups, or work with a counselor for support.  Ask people who smoke to avoid smoking around you.  Avoid places that trigger you to smoke, such as bars, parties, or smoke-break areas at work.  Spend time around people who do not smoke.  Lessen stress in your life, because stress can be a smoking trigger for some people. To lessen stress, try: ? Exercising regularly. ? Deep-breathing exercises. ? Yoga. ? Meditating. ? Performing a body scan. This involves closing your eyes, scanning your body from head to toe, and noticing which parts of your body are particularly tense. Purposefully relax the muscles in those areas.  Download or purchase mobile phone or tablet apps (applications) that can help you stick to your quit plan by providing reminders, tips, and encouragement. There are many free apps, such as QuitGuide from the CDC (Centers for Disease Control and Prevention). You can find other support for quitting smoking (smoking cessation) through smokefree.gov and other websites.  How will I feel when I quit smoking? Within the first 24 hours of quitting smoking, you may start to feel some withdrawal symptoms. These symptoms are usually most noticeable 2-3 days after quitting, but they usually do not last beyond 2-3 weeks. Changes or symptoms that you might experience include:  Mood swings.  Restlessness, anxiety, or irritation.  Difficulty concentrating.  Dizziness.  Strong cravings for sugary foods in addition to nicotine.  Mild weight gain.  Constipation.  Nausea.  Coughing or a sore throat.  Changes in how your medicines work in your  body.  A depressed mood.  Difficulty sleeping (insomnia).  After the first 2-3 weeks of quitting, you may start to notice more positive results, such as:  Improved sense of smell and taste.  Decreased coughing and sore throat.  Slower heart rate.  Lower blood pressure.  Clearer skin.  The ability to breathe more easily.  Fewer sick days.  Quitting smoking is very challenging for most people. Do not get discouraged if you are not successful the first time. Some people need to make many attempts to quit before they achieve long-term success. Do your best to stick to your quit plan, and talk with your health care provider if you have any questions or concerns. This information is not intended to replace advice given to you by your health care provider. Make sure you discuss any questions you have with your health care provider. Document Released: 02/28/2001 Document Revised: 11/02/2015 Document Reviewed: 07/21/2014 Elsevier Interactive Patient Education  2018 Elsevier Inc.  

## 2017-12-28 NOTE — Progress Notes (Signed)
BP 111/72   Pulse 78   Temp 98.3 F (36.8 C) (Oral)   Ht 5\' 8"  (1.727 m)   Wt 189 lb (85.7 kg)   BMI 28.74 kg/m    Subjective:    Patient ID: Tara Colon, female    DOB: 01-02-1991, 27 y.o.   MRN: [id number]  HPI: Tara Colon is a 27 y.o. female presenting on 12/27/2017 for Follow-up (bronchitis)  This patient comes back for recheck on her bronchitis.  She was quite ill last month.  She did finish her antibiotic.  She is still using her doxycycline at times.  She would really like to quit smoking.  She has the desire for doing it now.  We have had a long discussion about the use of Chantix as a way of doing this.  And she understands that it can help her reduce cravings by blocking her nicotine receptors.  She also understood that she can continue to smoke and aim for a quit date about 2 weeks into starting the medication.  We have discussed side effects and if they encouraged for her to call back if they are unbearable.  Otherwise she reports that she is doing well.  Past Medical History:  Diagnosis Date  . Anxiety   . Bipolar affect, depressed (HCC)   . BV (bacterial vaginosis)   . Depression   . Insomnia    Relevant past medical, surgical, family and social history reviewed and updated as indicated. Interim medical history since our last visit reviewed. Allergies and medications reviewed and updated. DATA REVIEWED: CHART IN EPIC  Family History reviewed for pertinent findings.  Review of Systems  Constitutional: Negative.   HENT: Negative.   Eyes: Negative.   Respiratory: Positive for cough and wheezing.   Gastrointestinal: Negative.   Genitourinary: Negative.     Allergies as of 12/27/2017   No Known Allergies     Medication List        Accurate as of 12/27/17 11:59 PM. Always use your most recent med list.          albuterol 108 (90 Base) MCG/ACT inhaler Commonly known as:  PROVENTIL HFA;VENTOLIN HFA Inhale 2 puffs into the lungs every 6 (six) hours  as needed for wheezing or shortness of breath.   metroNIDAZOLE 0.75 % vaginal gel Commonly known as:  METROGEL Place 1 Applicatorful vaginally 2 (two) times daily.   varenicline 0.5 MG X 11 & 1 MG X 42 tablet Commonly known as:  CHANTIX PAK Take one 0.5 mg tablet by mouth once daily for 3 days, then increase to one 0.5 mg tablet twice daily for 4 days, then increase to one 1 mg tablet twice daily.          Objective:    BP 111/72   Pulse 78   Temp 98.3 F (36.8 C) (Oral)   Ht 5\' 8"  (1.727 m)   Wt 189 lb (85.7 kg)   BMI 28.74 kg/m   No Known Allergies  Wt Readings from Last 3 Encounters:  12/27/17 189 lb (85.7 kg)  11/27/17 190 lb 12.8 oz (86.5 kg)  09/17/17 191 lb (86.6 kg)    Physical Exam  Constitutional: She is oriented to person, place, and time. She appears well-developed and well-nourished.  HENT:  Head: Normocephalic and atraumatic.  Right Ear: Tympanic membrane, external ear and ear canal normal.  Left Ear: Tympanic membrane, external ear and ear canal normal.  Nose: Nose normal. No rhinorrhea.  Mouth/Throat: Oropharynx is clear  and moist and mucous membranes are normal. No oropharyngeal exudate or posterior oropharyngeal erythema.  Eyes: Pupils are equal, round, and reactive to light. Conjunctivae and EOM are normal.  Neck: Normal range of motion. Neck supple.  Cardiovascular: Normal rate, regular rhythm, normal heart sounds and intact distal pulses.  Pulmonary/Chest: Effort normal and breath sounds normal.  Abdominal: Soft. Bowel sounds are normal.  Neurological: She is alert and oriented to person, place, and time. She has normal reflexes.  Skin: Skin is warm and dry. No rash noted.  Psychiatric: She has a normal mood and affect. Her behavior is normal. Judgment and thought content normal.        Assessment & Plan:   1. Encounter for smoking cessation counseling Chantix starter pack sent to the pharmacy  2. Acute bronchitis, unspecified  organism Albuterol as needed   Continue all other maintenance medications as listed above.  Follow up plan: No follow-ups on file.  Educational handout given for survey  Remus Loffler PA-C [facility] [address]  [place], [place] [address] [phone]   12/28/2017, 1:30 PM

## 2018-01-08 ENCOUNTER — Encounter: Payer: Medicaid Other | Admitting: Physician Assistant

## 2018-01-08 ENCOUNTER — Ambulatory Visit: Payer: Medicaid Other | Admitting: Physician Assistant

## 2018-01-29 ENCOUNTER — Encounter: Payer: Medicaid Other | Admitting: Physician Assistant

## 2018-01-29 DIAGNOSIS — Z7689 Persons encountering health services in other specified circumstances: Secondary | ICD-10-CM | POA: Diagnosis not present

## 2018-02-11 ENCOUNTER — Ambulatory Visit: Payer: Medicaid Other | Admitting: Physician Assistant

## 2018-02-11 ENCOUNTER — Encounter: Payer: Self-pay | Admitting: Physician Assistant

## 2018-02-11 VITALS — BP 101/66 | HR 85 | Temp 98.3°F | Ht 68.0 in | Wt 189.8 lb

## 2018-02-11 DIAGNOSIS — Z01419 Encounter for gynecological examination (general) (routine) without abnormal findings: Secondary | ICD-10-CM | POA: Diagnosis not present

## 2018-02-11 DIAGNOSIS — Z0000 Encounter for general adult medical examination without abnormal findings: Secondary | ICD-10-CM

## 2018-02-11 DIAGNOSIS — N979 Female infertility, unspecified: Secondary | ICD-10-CM | POA: Insufficient documentation

## 2018-02-11 NOTE — Progress Notes (Signed)
BP 101/66   Pulse 85   Temp 98.3 F (36.8 C) (Oral)   Ht 5\' 8"  (1.727 m)   Wt 189 lb 12.8 oz (86.1 kg)   BMI 28.86 kg/m    Subjective:    Patient ID: Tara Colon, female    DOB: Feb 08, 1991, 27 y.o.   MRN: [id number]  HPI: Tara Colon is a 27 y.o. female presenting on 02/11/2018 for Annual Exam  This patient comes in for annual well physical examination. All medications are reviewed today. There are no reports of any problems with the medications. All of the medical conditions are reviewed and updated.  Lab work is reviewed and will be ordered as medically necessary. There are no new problems reported with today's visit.  Patient reports doing well overall.  Some issues in the past year of not getting pregnancy. Had implanon removed but has not gotten pregnant.  Would like referral to gynecology.  Past Medical History:  Diagnosis Date  . Anxiety   . Bipolar affect, depressed (HCC)   . BV (bacterial vaginosis)   . Depression   . Insomnia    Relevant past medical, surgical, family and social history reviewed and updated as indicated. Interim medical history since our last visit reviewed. Allergies and medications reviewed and updated. DATA REVIEWED: CHART IN EPIC  Family History reviewed for pertinent findings.  Review of Systems  Constitutional: Negative.  Negative for activity change, fatigue and fever.  HENT: Negative.   Eyes: Negative.   Respiratory: Negative.  Negative for cough.   Cardiovascular: Negative.  Negative for chest pain.  Gastrointestinal: Negative.  Negative for abdominal pain.  Endocrine: Negative.   Genitourinary: Negative.  Negative for dysuria.  Musculoskeletal: Negative.   Skin: Negative.   Neurological: Negative.     Allergies as of 02/11/2018   No Known Allergies     Medication List        Accurate as of 02/11/18  9:55 PM. Always use your most recent med list.          albuterol 108 (90 Base) MCG/ACT inhaler Commonly known as:   PROVENTIL HFA;VENTOLIN HFA Inhale 2 puffs into the lungs every 6 (six) hours as needed for wheezing or shortness of breath.   metroNIDAZOLE 0.75 % vaginal gel Commonly known as:  METROGEL Place 1 Applicatorful vaginally 2 (two) times daily.   varenicline 0.5 MG X 11 & 1 MG X 42 tablet Commonly known as:  CHANTIX PAK Take one 0.5 mg tablet by mouth once daily for 3 days, then increase to one 0.5 mg tablet twice daily for 4 days, then increase to one 1 mg tablet twice daily.          Objective:    BP 101/66   Pulse 85   Temp 98.3 F (36.8 C) (Oral)   Ht 5\' 8"  (1.727 m)   Wt 189 lb 12.8 oz (86.1 kg)   BMI 28.86 kg/m   No Known Allergies  Wt Readings from Last 3 Encounters:  02/11/18 189 lb 12.8 oz (86.1 kg)  12/27/17 189 lb (85.7 kg)  11/27/17 190 lb 12.8 oz (86.5 kg)    Physical Exam  Constitutional: She is oriented to person, place, and time. She appears well-developed and well-nourished.  HENT:  Head: Normocephalic and atraumatic.  Eyes: Pupils are equal, round, and reactive to light. Conjunctivae and EOM are normal.  Neck: Normal range of motion. Neck supple.  Cardiovascular: Normal rate, regular rhythm, normal heart sounds and intact distal pulses.  Pulmonary/Chest: Effort normal and breath sounds normal. Right breast exhibits no mass, no skin change and no tenderness. Left breast exhibits no mass, no skin change and no tenderness. No breast tenderness, discharge or bleeding. Breasts are symmetrical.  Abdominal: Soft. Bowel sounds are normal.  Genitourinary: Vagina normal and uterus normal. Rectal exam shows no fissure. No breast tenderness, discharge or bleeding. There is no tenderness or lesion on the right labia. There is no tenderness or lesion on the left labia. Uterus is not deviated, not enlarged and not tender. Cervix exhibits no motion tenderness, no discharge and no friability. Right adnexum displays no mass, no tenderness and no fullness. Left adnexum displays no  mass, no tenderness and no fullness. No tenderness or bleeding in the vagina. No vaginal discharge found.  Neurological: She is alert and oriented to person, place, and time. She has normal reflexes.  Skin: Skin is warm and dry. No rash noted.  Psychiatric: She has a normal mood and affect. Her behavior is normal. Judgment and thought content normal.    Results for orders placed or performed in visit on 11/27/17  Urine Culture  Result Value Ref Range   Urine Culture, Routine Final report    Organism ID, Bacteria Comment   GC/Chlamydia Probe Amp([facility])  Result Value Ref Range   Chlamydia trachomatis, NAA Negative Negative   Neisseria gonorrhoeae by PCR Negative Negative  Microscopic Examination  Result Value Ref Range   WBC, UA 0-5 0 - 5 /hpf   RBC, UA 3-10 (A) 0 - 2 /hpf   Epithelial Cells (non renal) 0-10 0 - 10 /hpf   Renal Epithel, UA None seen None seen /hpf   Mucus, UA Present Not Estab.   Bacteria, UA Few None seen/Few  Urinalysis, Complete  Result Value Ref Range   Specific Gravity, UA >1.030 (H) 1.005 - 1.030   pH, UA 5.5 5.0 - 7.5   Color, UA Yellow Yellow   Appearance Ur Clear Clear   Leukocytes, UA Negative Negative   Protein, UA 2+ (A) Negative/Trace   Glucose, UA Negative Negative   Ketones, UA Trace (A) Negative   RBC, UA 1+ (A) Negative   Bilirubin, UA Negative Negative   Urobilinogen, Ur 0.2 0.2 - 1.0 mg/dL   Nitrite, UA Negative Negative   Microscopic Examination See below:   STD Screen (8)  Result Value Ref Range   Hep A IgM Negative Negative   Hepatitis B Surface Ag Negative Negative   Hep B C IgM Negative Negative   Hep C Virus Ab <0.1 0.0 - 0.9 s/co ratio   RPR Ser Ql Non Reactive Non Reactive   HIV Screen 4th Generation wRfx Non Reactive Non Reactive   HSV 1 Glycoprotein G Ab, IgG <0.91 0.00 - 0.90 index   HSV 2 IgG, Type Spec 16.90 (H) 0.00 - 0.90 index      Assessment & Plan:   1. Well female exam with routine gynecological exam - Pap IG,  CT/NG w/ reflex HPV when ASC-U  2. Female fertility problems - Ambulatory referral to Obstetrics / Gynecology   Continue all other maintenance medications as listed above.  Follow up plan: No follow-ups on file.  Educational handout given for survey  Remus LofflerAngel S. Elena Cothern PA-C [facility] [address]  [place], [place] [id number] [phone]   02/11/2018, 9:55 PM

## 2018-02-13 LAB — PAP IG, CT-NG, RFX HPV ASCU
Chlamydia, Nuc. Acid Amp: NEGATIVE
GONOCOCCUS BY NUCLEIC ACID AMP: NEGATIVE

## 2018-03-20 NOTE — L&D Delivery Note (Addendum)
Delivery Note At 3:06 AM a viable female was delivered via Vaginal, Spontaneous (Presentation:ROA).  APGAR: 8, 9; weight per medical record.   Placenta status: spontaneous in tact. Cord: 3 vessel cord  Anesthesia:  Epidural  Episiotomy: None Lacerations: Left Periurethral Suture Repair: 4.0 vicryl , repaired in the usual fashion Est. Blood Loss (mL):  254  Mom to postpartum.  Baby to Couplet care / Skin to Skin.  Lattie Haw MD PGY-1, [facility]  12/15/2018, 3:13 AM  GME ATTESTATION:  I saw and evaluated the patient. I was present throughout and supervised the entire delivery. I repaired the left periurethral laceration with 4-0 Vicryl in the usual fashion. I agree with the findings and the plan of care as documented in the resident's note.  Merilyn Baba, DO OB Fellow, [facility] 12/15/2018 4:09 AM

## 2018-04-29 DIAGNOSIS — Z7689 Persons encountering health services in other specified circumstances: Secondary | ICD-10-CM | POA: Diagnosis not present

## 2018-05-06 ENCOUNTER — Encounter: Payer: Self-pay | Admitting: Physician Assistant

## 2018-05-06 ENCOUNTER — Ambulatory Visit: Payer: Medicaid Other | Admitting: Physician Assistant

## 2018-05-06 VITALS — BP 111/71 | HR 72 | Temp 97.8°F | Ht 68.0 in | Wt 192.8 lb

## 2018-05-06 DIAGNOSIS — N979 Female infertility, unspecified: Secondary | ICD-10-CM | POA: Diagnosis not present

## 2018-05-06 DIAGNOSIS — F0630 Mood disorder due to known physiological condition, unspecified: Secondary | ICD-10-CM | POA: Diagnosis not present

## 2018-05-06 DIAGNOSIS — Z0000 Encounter for general adult medical examination without abnormal findings: Secondary | ICD-10-CM | POA: Diagnosis not present

## 2018-05-06 DIAGNOSIS — N926 Irregular menstruation, unspecified: Secondary | ICD-10-CM

## 2018-05-06 MED ORDER — CITALOPRAM HYDROBROMIDE 20 MG PO TABS: 20.0000 mg | ORAL_TABLET | Freq: Every day | ORAL | 3 refills | Status: DC

## 2018-05-06 NOTE — Patient Instructions (Signed)
Tara Colon

## 2018-05-06 NOTE — Progress Notes (Signed)
BP 111/71   Pulse 72   Temp 97.8 F (36.6 C) (Oral)   Ht '5\' 8"'  (1.727 m)   Wt 192 lb 12.8 oz (87.5 kg)   BMI 29.32 kg/m    Subjective:    Patient ID: Tara Colon, female    DOB: 06-May-1990, 28 y.o.   MRN: [id number]  HPI: Tara Colon is a 28 y.o. female presenting on 05/06/2018 for std screening and abnormal menstrual cycle  She reports that she has had unprotected sexual activity since 2017 when her Implanon was taken out.  Her cycles have become more frequent.  She is having them about twice a month.  They are not extremely heavy.  She does use pads and does not overflow them.  She has not had gynecology evaluation in some time.  He had a long discussion about the possibility of infertility going on.  We did draw some labs today.  And if nothing is abnormal we will plan for gynecology referral.  She also states that she is having a little more problems with her depression at this time.  She has had issues in the past with it.  She states that she will get on highly irritable and have a short fuse at times.  Depression screen [facility] 2/9 05/06/2018 02/11/2018 12/27/2017 11/27/2017 09/17/2017  Decreased Interest '2 1 1 2 ' 0  Down, Depressed, Hopeless 3 1 0 3 0  PHQ - 2 Score '5 2 1 5 ' 0  Altered sleeping 1 1 - 3 3  Tired, decreased energy 1 1 - 2 1  Change in appetite 0 0 - 0 1  Feeling bad or failure about yourself  2 0 - 2 0  Trouble concentrating 0 0 - 0 0  Moving slowly or fidgety/restless 0 0 - 0 0  Suicidal thoughts 3 0 - 1 0  PHQ-9 Score 12 4 - 13 5  Difficult doing work/chores - - - - -     Past Medical History:  Diagnosis Date  . Anxiety   . Bipolar affect, depressed ([facility])   . BV (bacterial vaginosis)   . Depression   . Insomnia    Relevant past medical, surgical, family and social history reviewed and updated as indicated. Interim medical history since our last visit reviewed. Allergies and medications reviewed and updated. DATA REVIEWED: CHART IN EPIC  Family  History reviewed for pertinent findings.  Review of Systems  Constitutional: Negative.  Negative for activity change, fatigue and fever.  HENT: Negative.   Eyes: Negative.   Respiratory: Negative.  Negative for cough.   Cardiovascular: Negative.  Negative for chest pain.  Gastrointestinal: Negative.  Negative for abdominal pain.  Endocrine: Negative.   Genitourinary: Positive for menstrual problem. Negative for dysuria, vaginal bleeding, vaginal discharge and vaginal pain.  Musculoskeletal: Negative.   Skin: Negative.   Neurological: Negative.     Allergies as of 05/06/2018   No Known Allergies     Medication List       Accurate as of May 06, 2018  1:43 PM. Always use your most recent med list.        albuterol 108 (90 Base) MCG/ACT inhaler Commonly known as:  PROVENTIL HFA;VENTOLIN HFA Inhale 2 puffs into the lungs every 6 (six) hours as needed for wheezing or shortness of breath.   citalopram 20 MG tablet Commonly known as:  CELEXA Take 1 tablet (20 mg total) by mouth daily.          Objective:  BP 111/71   Pulse 72   Temp 97.8 F (36.6 C) (Oral)   Ht '5\' 8"'  (1.727 m)   Wt 192 lb 12.8 oz (87.5 kg)   BMI 29.32 kg/m   No Known Allergies  Wt Readings from Last 3 Encounters:  05/06/18 192 lb 12.8 oz (87.5 kg)  02/11/18 189 lb 12.8 oz (86.1 kg)  12/27/17 189 lb (85.7 kg)    Physical Exam Constitutional:      Appearance: She is well-developed.  HENT:     Head: Normocephalic and atraumatic.     Right Ear: Tympanic membrane, ear canal and external ear normal.     Left Ear: Tympanic membrane, ear canal and external ear normal.     Nose: Nose normal. No rhinorrhea.     Mouth/Throat:     Pharynx: No oropharyngeal exudate or posterior oropharyngeal erythema.  Eyes:     Conjunctiva/sclera: Conjunctivae normal.     Pupils: Pupils are equal, round, and reactive to light.  Neck:     Musculoskeletal: Normal range of motion and neck supple.  Cardiovascular:      Rate and Rhythm: Normal rate and regular rhythm.     Heart sounds: Normal heart sounds.  Pulmonary:     Effort: Pulmonary effort is normal.     Breath sounds: Normal breath sounds.  Abdominal:     General: Bowel sounds are normal.     Palpations: Abdomen is soft.  Skin:    General: Skin is warm and dry.     Findings: No rash.  Neurological:     Mental Status: She is alert and oriented to person, place, and time.     Deep Tendon Reflexes: Reflexes are normal and symmetric.  Psychiatric:        Behavior: Behavior normal.        Thought Content: Thought content normal.        Judgment: Judgment normal.     Results for orders placed or performed in visit on 02/11/18  Pap IG, CT/NG w/ reflex HPV when ASC-U  Result Value Ref Range   Interpretation NILM    Category NIL    Infection FOC    Adequacy ENDO    Clinician Provided ICD10 Comment    Performed by: Comment    Note: Comment    Test Methodology Comment    PAP Reflex Comment    Chlamydia, Nuc. Acid Amp Negative Negative   Gonococcus by Nucleic Acid Amp Negative Negative      Assessment & Plan:   1. Irregular menses - CBC with Differential/Platelet - CMP14+EGFR - TSH - STD Screen (8) - FSH/LH  2. Well adult exam - CBC with Differential/Platelet - CMP14+EGFR - TSH  3. Infertility, female - TSH - STD Screen (8) - FSH/LH  4. Mood disorder in conditions classified elsewhere - citalopram (CELEXA) 20 MG tablet; Take 1 tablet (20 mg total) by mouth daily.  Dispense: 30 tablet; Refill: 3   Continue all other maintenance medications as listed above.  Follow up plan: Return in about 4 weeks (around 06/03/2018).  Educational handout given for [place] PA-C [facility] [address]  [place], [place] [address] [phone]   05/06/2018, 1:43 PM

## 2018-05-07 ENCOUNTER — Other Ambulatory Visit: Payer: Self-pay | Admitting: Physician Assistant

## 2018-05-07 ENCOUNTER — Telehealth: Payer: Self-pay | Admitting: Physician Assistant

## 2018-05-07 DIAGNOSIS — N926 Irregular menstruation, unspecified: Secondary | ICD-10-CM

## 2018-05-07 DIAGNOSIS — N979 Female infertility, unspecified: Secondary | ICD-10-CM

## 2018-05-07 LAB — FSH/LH
FSH: 6.4 m[IU]/mL
LH: 6.2 m[IU]/mL

## 2018-05-07 LAB — CBC WITH DIFFERENTIAL/PLATELET
BASOS ABS: 0 10*3/uL (ref 0.0–0.2)
Basos: 1 %
EOS (ABSOLUTE): 0.3 10*3/uL (ref 0.0–0.4)
Eos: 5 %
Hematocrit: 40.7 % (ref 34.0–46.6)
Hemoglobin: 13.9 g/dL (ref 11.1–15.9)
Immature Grans (Abs): 0 10*3/uL (ref 0.0–0.1)
Immature Granulocytes: 0 %
LYMPHS: 38 %
Lymphocytes Absolute: 2 10*3/uL (ref 0.7–3.1)
MCH: 32.2 pg (ref 26.6–33.0)
MCHC: 34.2 g/dL (ref 31.5–35.7)
MCV: 94 fL (ref 79–97)
Monocytes Absolute: 0.6 10*3/uL (ref 0.1–0.9)
Monocytes: 11 %
Neutrophils Absolute: 2.5 10*3/uL (ref 1.4–7.0)
Neutrophils: 45 %
PLATELETS: 260 10*3/uL (ref 150–450)
RBC: 4.32 x10E6/uL (ref 3.77–5.28)
RDW: 12.2 % (ref 11.7–15.4)
WBC: 5.3 10*3/uL (ref 3.4–10.8)

## 2018-05-07 LAB — CMP14+EGFR
ALK PHOS: 74 IU/L (ref 39–117)
ALT: 12 IU/L (ref 0–32)
AST: 14 IU/L (ref 0–40)
Albumin/Globulin Ratio: 1.7 (ref 1.2–2.2)
Albumin: 4.5 g/dL (ref 3.9–5.0)
BUN/Creatinine Ratio: 23 (ref 9–23)
BUN: 17 mg/dL (ref 6–20)
Bilirubin Total: <0.2 mg/dL (ref 0.0–1.2)
CHLORIDE: 104 mmol/L (ref 96–106)
CO2: 21 mmol/L (ref 20–29)
CREATININE: 0.74 mg/dL (ref 0.57–1.00)
Calcium: 9.6 mg/dL (ref 8.7–10.2)
GFR calc Af Amer: 129 mL/min/{1.73_m2} (ref >59)
GFR calc non Af Amer: 112 mL/min/{1.73_m2} (ref >59)
GLUCOSE: 87 mg/dL (ref 65–99)
Globulin, Total: 2.7 g/dL (ref 1.5–4.5)
Potassium: 4.1 mmol/L (ref 3.5–5.2)
Sodium: 144 mmol/L (ref 134–144)
Total Protein: 7.2 g/dL (ref 6.0–8.5)

## 2018-05-07 LAB — STD SCREEN (8)
HEP A IGM: NEGATIVE
HEP B C IGM: NEGATIVE
HEP B S AG: NEGATIVE
HIV Screen 4th Generation wRfx: NONREACTIVE
HSV 2 IgG, Type Spec: 15.3 index — ABNORMAL HIGH (ref 0.00–0.90)
Hep C Virus Ab: <0.1 s/co ratio (ref 0.0–0.9)
RPR Ser Ql: NONREACTIVE

## 2018-05-07 LAB — TSH: TSH: 2.65 u[IU]/mL (ref 0.450–4.500)

## 2018-05-07 NOTE — Telephone Encounter (Signed)
Patient aware of results.

## 2018-05-16 ENCOUNTER — Encounter: Payer: Medicaid Other | Admitting: Adult Health

## 2018-05-16 NOTE — Congregational Nurse Program (Unsigned)
  Dept: [phone]   Congregational Nurse Program Note  Date of Encounter: 05/16/2018  Past Medical History: Past Medical History:  Diagnosis Date  . Anxiety   . Bipolar affect, depressed (HCC)   . BV (bacterial vaginosis)   . Depression   . Insomnia     Encounter Details: CNP Questionnaire - 05/16/18 1100      Questionnaire   Patient Status  Not Applicable    Race  Black or African American    Location Patient Served At  [employer]  No food insecurities    Housing/Utilities  Yes, have permanent housing    Transportation  No transportation needs    Interpersonal Safety  Yes, feel physically and emotionally safe where you currently live    Medication  No medication insecurities    Medical Provider  Yes    Referrals  Not Applicable    ED Visit Averted  Not Applicable    Life-Saving Intervention Made  Not Applicable     05/16/2018 Just wanted to talk.  Just found out she is pregnant.  Advised to see her Dr. For referral to OB-GYN services.  Has an appoint ment to see [facility] . Excited, thought she could not get pregnant again.  Advised to keep appointment and take her prenatal vitamins as directed and follow the regime that the Dr. Prescribes for her. [place] [facility] [phone].

## 2018-05-21 DIAGNOSIS — Z3A00 Weeks of gestation of pregnancy not specified: Secondary | ICD-10-CM | POA: Diagnosis not present

## 2018-05-21 DIAGNOSIS — O4690 Antepartum hemorrhage, unspecified, unspecified trimester: Secondary | ICD-10-CM | POA: Diagnosis not present

## 2018-05-21 DIAGNOSIS — Z5321 Procedure and treatment not carried out due to patient leaving prior to being seen by health care provider: Secondary | ICD-10-CM | POA: Diagnosis not present

## 2018-05-22 ENCOUNTER — Ambulatory Visit (INDEPENDENT_AMBULATORY_CARE_PROVIDER_SITE_OTHER): Payer: Medicaid Other | Admitting: Adult Health

## 2018-05-22 ENCOUNTER — Encounter: Payer: Self-pay | Admitting: Adult Health

## 2018-05-22 VITALS — BP 122/72 | HR 77 | Ht 68.0 in | Wt 185.5 lb

## 2018-05-22 DIAGNOSIS — Z716 Tobacco abuse counseling: Secondary | ICD-10-CM | POA: Insufficient documentation

## 2018-05-22 DIAGNOSIS — N926 Irregular menstruation, unspecified: Secondary | ICD-10-CM | POA: Diagnosis not present

## 2018-05-22 DIAGNOSIS — Z7689 Persons encountering health services in other specified circumstances: Secondary | ICD-10-CM | POA: Diagnosis not present

## 2018-05-22 DIAGNOSIS — Z3A01 Less than 8 weeks gestation of pregnancy: Secondary | ICD-10-CM | POA: Diagnosis not present

## 2018-05-22 DIAGNOSIS — Z3201 Encounter for pregnancy test, result positive: Secondary | ICD-10-CM

## 2018-05-22 DIAGNOSIS — O3680X0 Pregnancy with inconclusive fetal viability, not applicable or unspecified: Secondary | ICD-10-CM

## 2018-05-22 LAB — POCT URINE PREGNANCY: Preg Test, Ur: POSITIVE — AB

## 2018-05-22 MED ORDER — PRENATAL PLUS 27-1 MG PO TABS: 1.0000 | ORAL_TABLET | Freq: Every day | ORAL | 12 refills | Status: DC

## 2018-05-22 NOTE — Progress Notes (Signed)
Patient ID: Tara Colon, female   DOB: Apr 20, 1990, 28 y.o.   MRN: [id number] History of Present Illness: Tara Colon is a 28 year old black female, single in for UPT, has missed a period and had 1+HPT. Gets BV often and has Metrogel do not use for now. She had been trying for 3 years to get pregnant and even had labs at PCP, that were all normal. She has 76 year old daughter.  PCP is Prudy Feeler PA.   Current Medications, Allergies, Past Medical History, Past Surgical History, Family History and Social History were reviewed in [name] record.     Review of Systems: +missed period, had +HPT Spotted yesterday after BM, none now   Physical Exam:BP 122/72 (BP Location: Left Arm, Patient Position: Sitting, Cuff Size: Normal)   Pulse 77   Ht 5\' 8"  (1.727 m)   Wt 185 lb 8 oz (84.1 kg)   LMP 04/19/2018   Breastfeeding No   BMI 28.21 kg/m   UPT+, about 4+4 weeks, by LMP with EDD 01/25/2019. General:  Well developed, well nourished, no acute distress Skin:  Warm and dry Neck:  Midline trachea, normal thyroid, good ROM, no lymphadenopathy Lungs; Clear to auscultation bilaterally Cardiovascular: Regular rate and rhythm Abdomen:  Soft, non tender Psych:  No mood changes, alert and cooperative,seems happy Fall risk is low. PHQ 9 score 3, is on celexa, denies being suicidal   Impression: 1. Pregnancy examination or test, positive result   2. Less than [redacted] weeks gestation of pregnancy   3. Encounter to determine fetal viability of pregnancy, single or unspecified fetus       Plan: Check QHCG and progesterone  Meds ordered this encounter  Medications  . prenatal vitamin w/FE, FA (PRENATAL 1 + 1) 27-1 MG TABS tablet    Sig: Take 1 tablet by mouth daily at 12 noon.    Dispense:  30 each    Refill:  12    Order Specific Question:   Supervising Provider    Answer:   Lazaro Arms [[id number]]  Review handouts First trimester and by [facility]  No sex for now Ok to take  celexa

## 2018-05-22 NOTE — Patient Instructions (Signed)
First Trimester of Pregnancy  The first trimester of pregnancy is from week 1 until the end of week 13 (months 1 through 3). A week after a sperm fertilizes an egg, the egg will implant on the wall of the uterus. This embryo will begin to develop into a baby. Genes from you and your partner will form the baby. The female genes will determine whether the baby will be a boy or a girl. At 6-8 weeks, the eyes and face will be formed, and the heartbeat can be seen on ultrasound. At the end of 12 weeks, all the baby's organs will be formed.  Now that you are pregnant, you will want to do everything you can to have a healthy baby. Two of the most important things are to get good prenatal care and to follow your health care provider's instructions. Prenatal care is all the medical care you receive before the baby's birth. This care will help prevent, find, and treat any problems during the pregnancy and childbirth.  Body changes during your first trimester  Your body goes through many changes during pregnancy. The changes vary from woman to woman.   You may gain or lose a couple of pounds at first.   You may feel sick to your stomach (nauseous) and you may throw up (vomit). If the vomiting is uncontrollable, call your health care provider.   You may tire easily.   You may develop headaches that can be relieved by medicines. All medicines should be approved by your health care provider.   You may urinate more often. Painful urination may mean you have a bladder infection.   You may develop heartburn as a result of your pregnancy.   You may develop constipation because certain hormones are causing the muscles that push stool through your intestines to slow down.   You may develop hemorrhoids or swollen veins (varicose veins).   Your breasts may begin to grow larger and become tender. Your nipples may stick out more, and the tissue that surrounds them (areola) may become darker.   Your gums may bleed and may be  sensitive to brushing and flossing.   Dark spots or blotches (chloasma, mask of pregnancy) may develop on your face. This will likely fade after the baby is born.   Your menstrual periods will stop.   You may have a loss of appetite.   You may develop cravings for certain kinds of food.   You may have changes in your emotions from day to day, such as being excited to be pregnant or being concerned that something may go wrong with the pregnancy and baby.   You may have more vivid and strange dreams.   You may have changes in your hair. These can include thickening of your hair, rapid growth, and changes in texture. Some women also have hair loss during or after pregnancy, or hair that feels dry or thin. Your hair will most likely return to normal after your baby is born.  What to expect at prenatal visits  During a routine prenatal visit:   You will be weighed to make sure you and the baby are growing normally.   Your blood pressure will be taken.   Your abdomen will be measured to track your baby's growth.   The fetal heartbeat will be listened to between weeks 10 and 14 of your pregnancy.   Test results from any previous visits will be discussed.  Your health care provider may ask you:     How you are feeling.   If you are feeling the baby move.   If you have had any abnormal symptoms, such as leaking fluid, bleeding, severe headaches, or abdominal cramping.   If you are using any tobacco products, including cigarettes, chewing tobacco, and electronic cigarettes.   If you have any questions.  Other tests that may be performed during your first trimester include:   Blood tests to find your blood type and to check for the presence of any previous infections. The tests will also be used to check for low iron levels (anemia) and protein on red blood cells (Rh antibodies). Depending on your risk factors, or if you previously had diabetes during pregnancy, you may have tests to check for high blood sugar  that affects pregnant women (gestational diabetes).   Urine tests to check for infections, diabetes, or protein in the urine.   An ultrasound to confirm the proper growth and development of the baby.   Fetal screens for spinal cord problems (spina bifida) and Down syndrome.   HIV (human immunodeficiency virus) testing. Routine prenatal testing includes screening for HIV, unless you choose not to have this test.   You may need other tests to make sure you and the baby are doing well.  Follow these instructions at home:  Medicines   Follow your health care provider's instructions regarding medicine use. Specific medicines may be either safe or unsafe to take during pregnancy.   Take a prenatal vitamin that contains at least 600 micrograms (mcg) of folic acid.   If you develop constipation, try taking a stool softener if your health care provider approves.  Eating and drinking     Eat a balanced diet that includes fresh fruits and vegetables, whole grains, good sources of protein such as meat, eggs, or tofu, and low-fat dairy. Your health care provider will help you determine the amount of weight gain that is right for you.   Avoid raw meat and uncooked cheese. These carry germs that can cause birth defects in the baby.   Eating four or five small meals rather than three large meals a day may help relieve nausea and vomiting. If you start to feel nauseous, eating a few soda crackers can be helpful. Drinking liquids between meals, instead of during meals, also seems to help ease nausea and vomiting.   Limit foods that are high in fat and processed sugars, such as fried and sweet foods.   To prevent constipation:  ? Eat foods that are high in fiber, such as fresh fruits and vegetables, whole grains, and beans.  ? Drink enough fluid to keep your urine clear or pale yellow.  Activity   Exercise only as directed by your health care provider. Most women can continue their usual exercise routine during  pregnancy. Try to exercise for 30 minutes at least 5 days a week. Exercising will help you:  ? Control your weight.  ? Stay in shape.  ? Be prepared for labor and delivery.   Experiencing pain or cramping in the lower abdomen or lower back is a good sign that you should stop exercising. Check with your health care provider before continuing with normal exercises.   Try to avoid standing for long periods of time. Move your legs often if you must stand in one place for a long time.   Avoid heavy lifting.   Wear low-heeled shoes and practice good posture.   You may continue to have sex unless your health care   provider tells you not to.  Relieving pain and discomfort   Wear a good support bra to relieve breast tenderness.   Take warm sitz baths to soothe any pain or discomfort caused by hemorrhoids. Use hemorrhoid cream if your health care provider approves.   Rest with your legs elevated if you have leg cramps or low back pain.   If you develop varicose veins in your legs, wear support hose. Elevate your feet for 15 minutes, 3-4 times a day. Limit salt in your diet.  Prenatal care   Schedule your prenatal visits by the twelfth week of pregnancy. They are usually scheduled monthly at first, then more often in the last 2 months before delivery.   Write down your questions. Take them to your prenatal visits.   Keep all your prenatal visits as told by your health care provider. This is important.  Safety   Wear your seat belt at all times when driving.   Make a list of emergency phone numbers, including numbers for family, friends, the hospital, and police and fire departments.  General instructions   Ask your health care provider for a referral to a local prenatal education class. Begin classes no later than the beginning of month 6 of your pregnancy.   Ask for help if you have counseling or nutritional needs during pregnancy. Your health care provider can offer advice or refer you to specialists for help  with various needs.   Do not use hot tubs, steam rooms, or saunas.   Do not douche or use tampons or scented sanitary pads.   Do not cross your legs for long periods of time.   Avoid cat litter boxes and soil used by cats. These carry germs that can cause birth defects in the baby and possibly loss of the fetus by miscarriage or stillbirth.   Avoid all smoking, herbs, alcohol, and medicines not prescribed by your health care provider. Chemicals in these products affect the formation and growth of the baby.   Do not use any products that contain nicotine or tobacco, such as cigarettes and e-cigarettes. If you need help quitting, ask your health care provider. You may receive counseling support and other resources to help you quit.   Schedule a dentist appointment. At home, brush your teeth with a soft toothbrush and be gentle when you floss.  Contact a health care provider if:   You have dizziness.   You have mild pelvic cramps, pelvic pressure, or nagging pain in the abdominal area.   You have persistent nausea, vomiting, or diarrhea.   You have a bad smelling vaginal discharge.   You have pain when you urinate.   You notice increased swelling in your face, hands, legs, or ankles.   You are exposed to fifth disease or chickenpox.   You are exposed to German measles (rubella) and have never had it.  Get help right away if:   You have a fever.   You are leaking fluid from your vagina.   You have spotting or bleeding from your vagina.   You have severe abdominal cramping or pain.   You have rapid weight gain or loss.   You vomit blood or material that looks like coffee grounds.   You develop a severe headache.   You have shortness of breath.   You have any kind of trauma, such as from a fall or a car accident.  Summary   The first trimester of pregnancy is from week 1 until   the end of week 13 (months 1 through 3).   Your body goes through many changes during pregnancy. The changes vary from  woman to woman.   You will have routine prenatal visits. During those visits, your health care provider will examine you, discuss any test results you may have, and talk with you about how you are feeling.  This information is not intended to replace advice given to you by your health care provider. Make sure you discuss any questions you have with your health care provider.  Document Released: 02/28/2001 Document Revised: 02/16/2016 Document Reviewed: 02/16/2016  [employer]vier Interactive Patient Education  2019 [employer]

## 2018-05-23 LAB — PROGESTERONE: Progesterone: 16 ng/mL

## 2018-05-23 LAB — BETA HCG QUANT (REF LAB): HCG QUANT: 24714 m[IU]/mL

## 2018-05-27 ENCOUNTER — Other Ambulatory Visit: Payer: Self-pay | Admitting: Adult Health

## 2018-05-27 IMAGING — CT CT HEAD W/O CM
3 of 4 series · 15 of 47 positions shown, 18 images · non-contrast
Comparison: CT of the head performed 01/23/2015

CLINICAL DATA: Acute onset of vomiting after eating rock candy.
Patient unresponsive. Initial encounter.

EXAM:
CT HEAD WITHOUT CONTRAST
TECHNIQUE: Contiguous axial images were obtained from the base of the skull
through the vertex without intravenous contrast.

[Series 2: head w/o · axial · non-contrast · 0.45mm/px · z∈[-188,-63]mm · 9 of 31 slices shown, 12 images]
[im 3/31  brain]
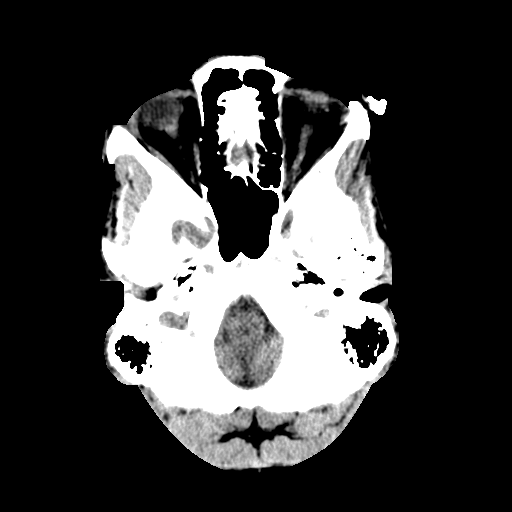
[im 3/31  bone]
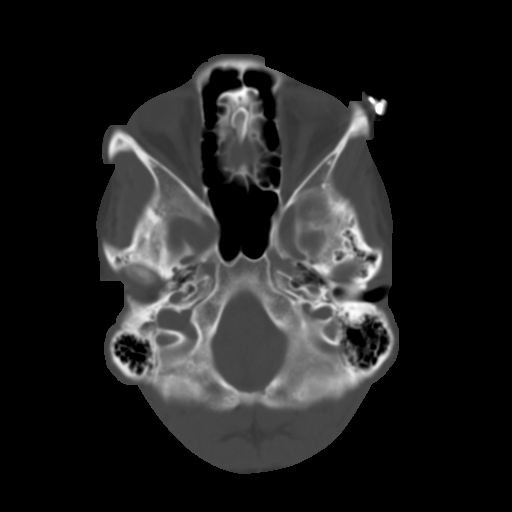
[im 7/31  brain]
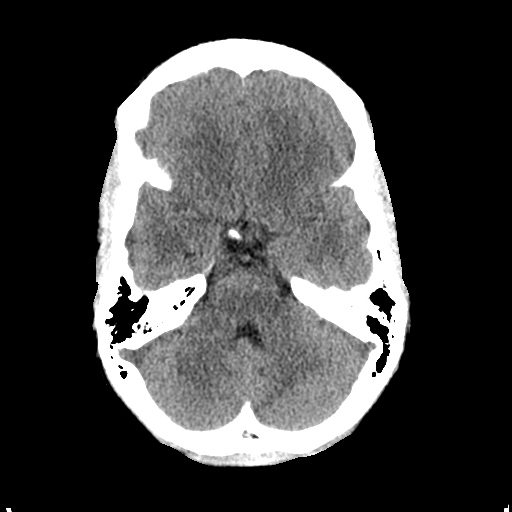
[im 9/31  brain]
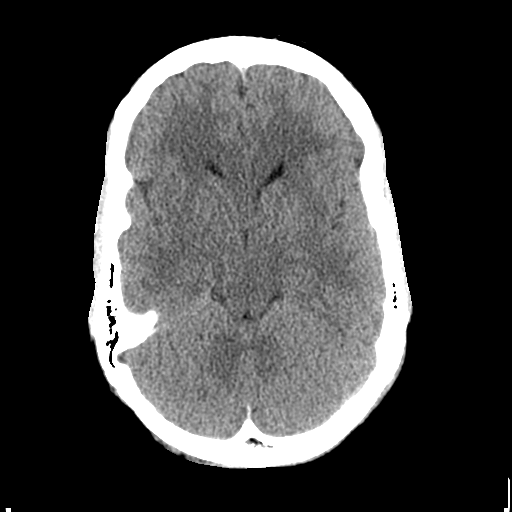
[im 13/31  brain]
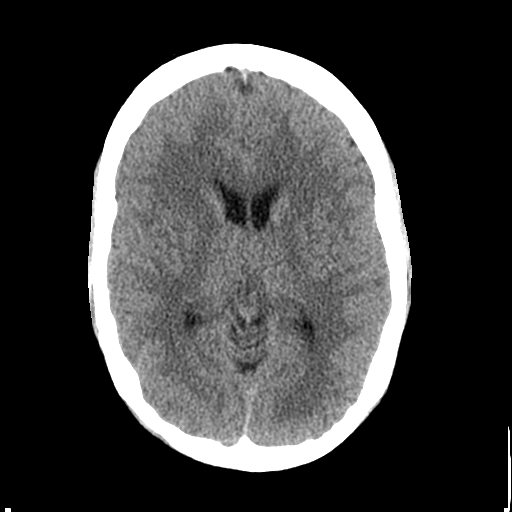
[im 16/31  brain]
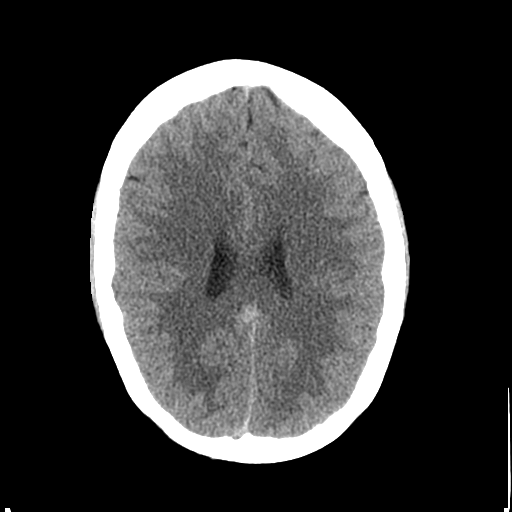
[im 16/31  bone]
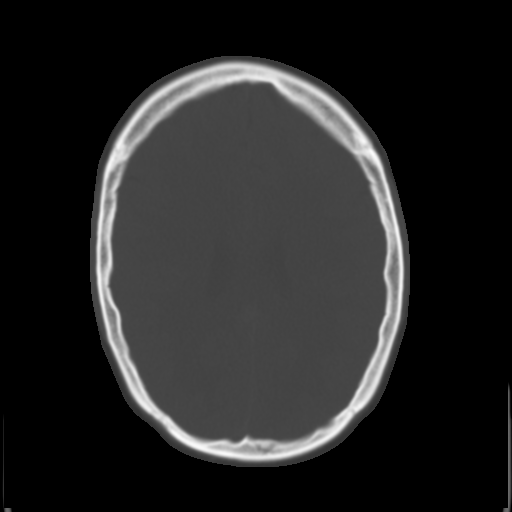
[im 18/31  brain]
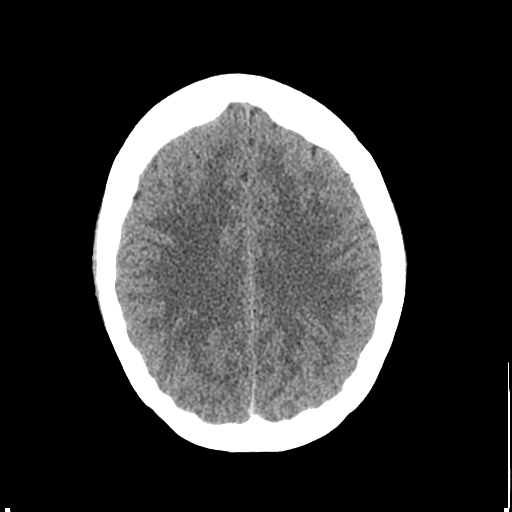
[im 22/31  brain]
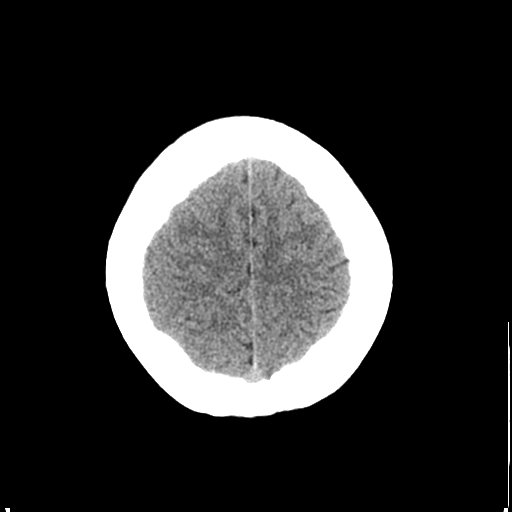
[im 24/31  brain]
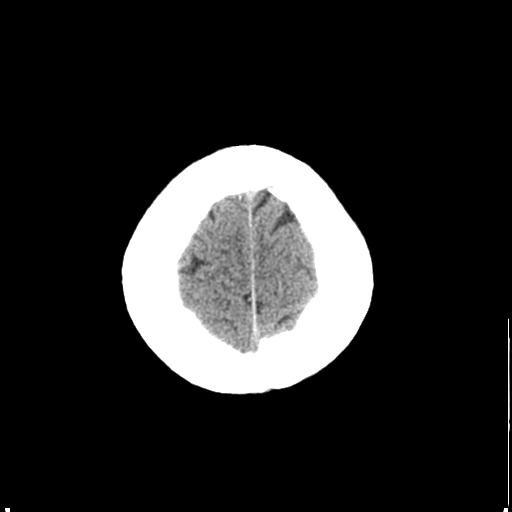
[im 28/31  brain]
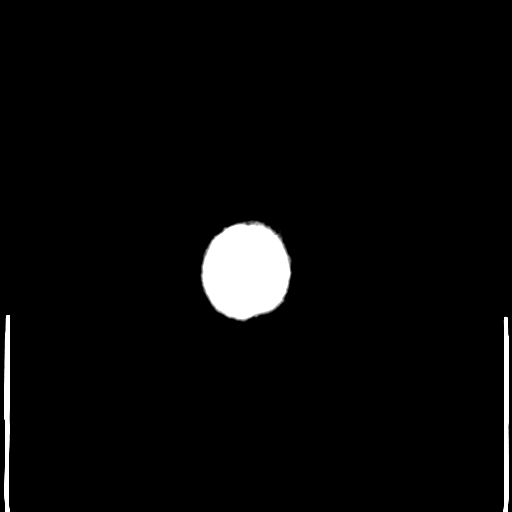
[im 28/31  bone]
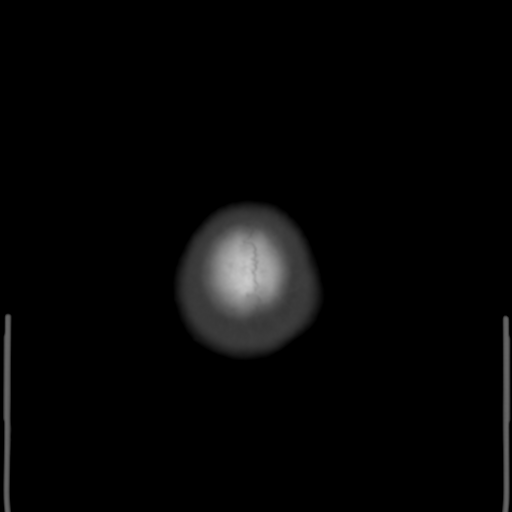

[Series 5: coronal · coronal · 0.29mm/px · 3 of 71 slices shown]
[im 24/71  brain]
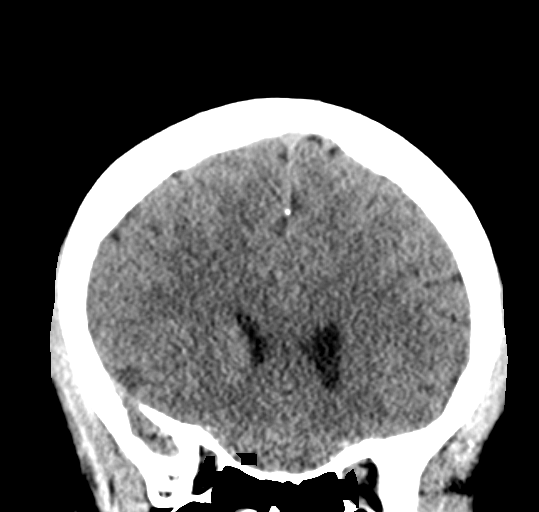
[im 32/71  brain]
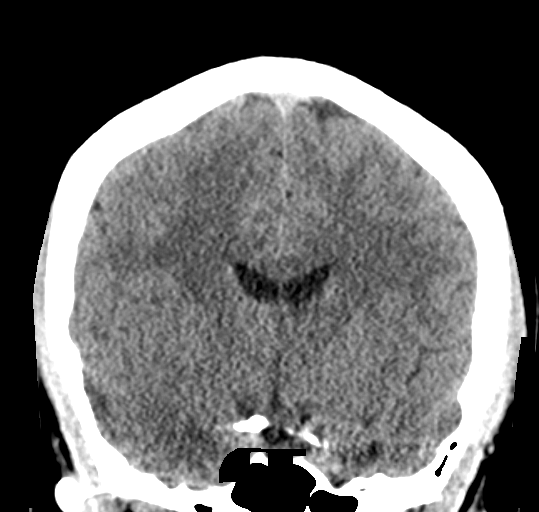
[im 39/71  brain]
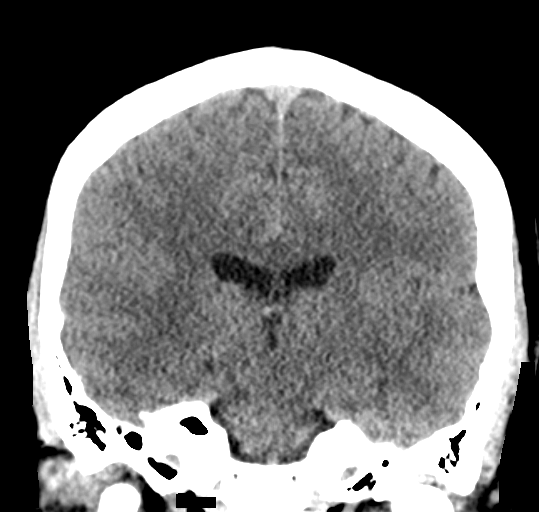

[Series 6: sagittal · sagittal · 0.28mm/px · 3 of 54 slices shown]
[im 18/54  brain]
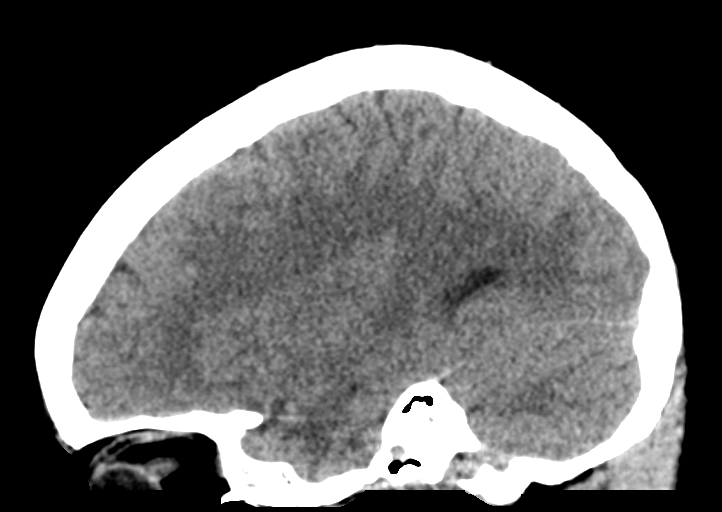
[im 27/54  brain]
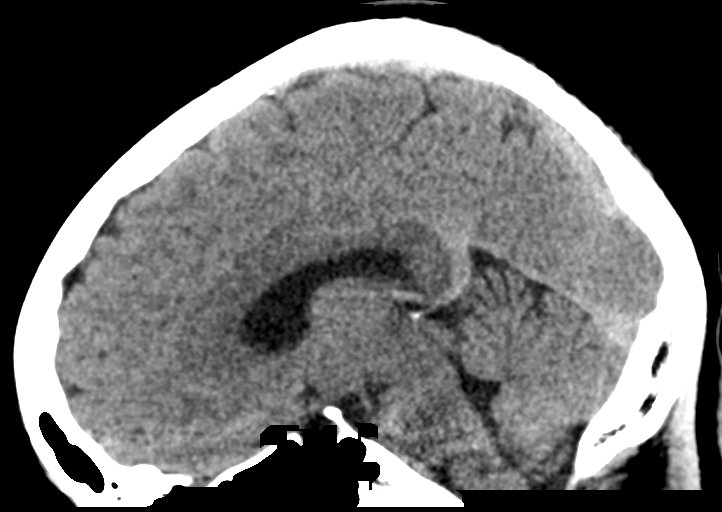
[im 36/54  brain]
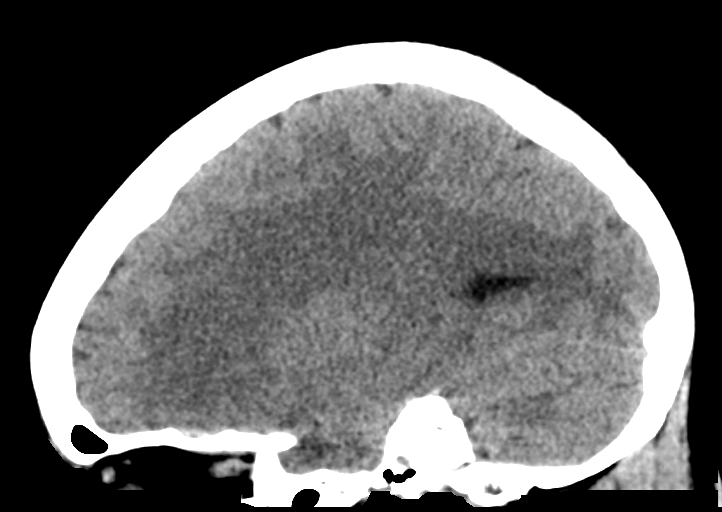

[15 of 47 positions shown; findings below may reference images not displayed]

FINDINGS: Brain: No evidence of acute infarction, hemorrhage, hydrocephalus,
extra-axial collection or mass lesion/mass effect.

The posterior fossa, including the cerebellum, brainstem and fourth
ventricle, is within normal limits. The third and lateral
ventricles, and basal ganglia are unremarkable in appearance. The
cerebral hemispheres are symmetric in appearance, with normal
gray-white differentiation. No mass effect or midline shift is seen.

Vascular: No hyperdense vessel or unexpected calcification.

Skull: There is no evidence of fracture; visualized osseous
structures are unremarkable in appearance.

Sinuses/Orbits: The visualized portions of the orbits are within
normal limits. The paranasal sinuses and mastoid air cells are
well-aerated.

Other: No significant soft tissue abnormalities are seen.
IMPRESSION: Unremarkable noncontrast CT of the head.

## 2018-05-27 MED ORDER — PROMETHAZINE HCL 25 MG PO TABS: 25.0000 mg | ORAL_TABLET | Freq: Four times a day (QID) | ORAL | 1 refills | Status: DC | PRN

## 2018-05-27 NOTE — Progress Notes (Signed)
rx phenergan 

## 2018-06-03 ENCOUNTER — Ambulatory Visit: Payer: Medicaid Other | Admitting: Physician Assistant

## 2018-06-03 ENCOUNTER — Telehealth: Payer: Self-pay | Admitting: Adult Health

## 2018-06-03 NOTE — Telephone Encounter (Signed)
QHCG places you around 6 weeks, keep [place] appt next week

## 2018-06-03 NOTE — Telephone Encounter (Signed)
Patient called, stated she had already gotten results, but she would like to discuss them more.  [phone]

## 2018-06-05 ENCOUNTER — Encounter: Payer: Medicaid Other | Admitting: Adult Health

## 2018-06-11 ENCOUNTER — Ambulatory Visit (INDEPENDENT_AMBULATORY_CARE_PROVIDER_SITE_OTHER): Payer: Medicaid Other

## 2018-06-11 ENCOUNTER — Other Ambulatory Visit: Payer: Self-pay

## 2018-06-11 DIAGNOSIS — O3680X0 Pregnancy with inconclusive fetal viability, not applicable or unspecified: Secondary | ICD-10-CM | POA: Diagnosis not present

## 2018-06-11 DIAGNOSIS — Z3A01 Less than 8 weeks gestation of pregnancy: Secondary | ICD-10-CM | POA: Diagnosis not present

## 2018-06-11 DIAGNOSIS — Z7689 Persons encountering health services in other specified circumstances: Secondary | ICD-10-CM | POA: Diagnosis not present

## 2018-06-11 NOTE — Progress Notes (Signed)
[place] 8+5 wks,single IUP w/ys,positive fht 169 bpm,normal left ovary,right ovary not visualized,crl 21.25 mm

## 2018-06-13 ENCOUNTER — Encounter (HOSPITAL_COMMUNITY): Payer: Self-pay

## 2018-06-13 ENCOUNTER — Other Ambulatory Visit: Payer: Self-pay

## 2018-06-13 ENCOUNTER — Emergency Department (HOSPITAL_COMMUNITY)
Admission: EM | Admit: 2018-06-13 | Discharge: 2018-06-14 | Disposition: A | Discharge status: Home / Self Care | Payer: Medicaid Other | Attending: Emergency Medicine | Admitting: Emergency Medicine

## 2018-06-13 DIAGNOSIS — O219 Vomiting of pregnancy, unspecified: Secondary | ICD-10-CM | POA: Insufficient documentation

## 2018-06-13 DIAGNOSIS — Z87891 Personal history of nicotine dependence: Secondary | ICD-10-CM | POA: Diagnosis not present

## 2018-06-13 DIAGNOSIS — Z79899 Other long term (current) drug therapy: Secondary | ICD-10-CM | POA: Insufficient documentation

## 2018-06-13 DIAGNOSIS — Z3A08 8 weeks gestation of pregnancy: Secondary | ICD-10-CM | POA: Insufficient documentation

## 2018-06-13 LAB — COMPREHENSIVE METABOLIC PANEL
ALT: 15 U/L (ref 0–44)
AST: 19 U/L (ref 15–41)
Albumin: 4.3 g/dL (ref 3.5–5.0)
Alkaline Phosphatase: 59 U/L (ref 38–126)
Anion gap: 12 (ref 5–15)
BUN: 14 mg/dL (ref 6–20)
CO2: 25 mmol/L (ref 22–32)
Calcium: 9.8 mg/dL (ref 8.9–10.3)
Chloride: 99 mmol/L (ref 98–111)
Creatinine, Ser: 0.64 mg/dL (ref 0.44–1.00)
GFR calc Af Amer: >60 mL/min (ref >60)
GFR calc non Af Amer: >60 mL/min (ref >60)
GLUCOSE: 85 mg/dL (ref 70–99)
Potassium: 3.5 mmol/L (ref 3.5–5.1)
SODIUM: 136 mmol/L (ref 135–145)
Total Bilirubin: 0.6 mg/dL (ref 0.3–1.2)
Total Protein: 8.1 g/dL (ref 6.5–8.1)

## 2018-06-13 LAB — URINALYSIS, ROUTINE W REFLEX MICROSCOPIC
Bacteria, UA: NONE SEEN
Bilirubin Urine: NEGATIVE
Glucose, UA: NEGATIVE mg/dL
Hgb urine dipstick: NEGATIVE
Ketones, ur: 80 mg/dL — AB
Leukocytes,Ua: NEGATIVE
Nitrite: NEGATIVE
Protein, ur: 100 mg/dL — AB
Specific Gravity, Urine: 1.032 — ABNORMAL HIGH (ref 1.005–1.030)
pH: 5 (ref 5.0–8.0)

## 2018-06-13 LAB — CBC WITH DIFFERENTIAL/PLATELET
Abs Immature Granulocytes: 0 10*3/uL (ref 0.00–0.07)
BASOS PCT: 1 %
Basophils Absolute: 0 10*3/uL (ref 0.0–0.1)
Eosinophils Absolute: 0.2 10*3/uL (ref 0.0–0.5)
Eosinophils Relative: 3 %
HCT: 43.2 % (ref 36.0–46.0)
Hemoglobin: 15.1 g/dL — ABNORMAL HIGH (ref 12.0–15.0)
Immature Granulocytes: 0 %
Lymphocytes Relative: 30 %
Lymphs Abs: 1.6 10*3/uL (ref 0.7–4.0)
MCH: 31.8 pg (ref 26.0–34.0)
MCHC: 35 g/dL (ref 30.0–36.0)
MCV: 90.9 fL (ref 80.0–100.0)
MONOS PCT: 10 %
Monocytes Absolute: 0.5 10*3/uL (ref 0.1–1.0)
Neutro Abs: 3 10*3/uL (ref 1.7–7.7)
Neutrophils Relative %: 56 %
Platelets: 283 10*3/uL (ref 150–400)
RBC: 4.75 MIL/uL (ref 3.87–5.11)
RDW: 12.2 % (ref 11.5–15.5)
WBC: 5.3 10*3/uL (ref 4.0–10.5)
nRBC: 0 % (ref 0.0–0.2)

## 2018-06-13 MED ORDER — SODIUM CHLORIDE 0.9 % IV BOLUS
1000.0000 mL | Freq: Once | INTRAVENOUS | Status: AC
  Administered 2018-06-13: 1000 mL via INTRAVENOUS

## 2018-06-13 MED ORDER — ONDANSETRON HCL 4 MG/2ML IJ SOLN
4.0000 mg | Freq: Once | INTRAMUSCULAR | Status: AC
  Administered 2018-06-13: 4 mg via INTRAVENOUS
  Filled 2018-06-13: qty 2

## 2018-06-13 NOTE — ED Triage Notes (Signed)
Pt presents to ED with emesis. Pt vomited 6-7 times today. Pt currently [redacted] weeks pregnant. Pt states she can not keep anything down. Pt has been prescribed promethazine but has not tried to take it today.

## 2018-06-14 MED ORDER — ONDANSETRON HCL 4 MG PO TABS: 4.0000 mg | ORAL_TABLET | Freq: Four times a day (QID) | ORAL | 0 refills | Status: DC

## 2018-06-14 MED ORDER — SODIUM CHLORIDE 0.9 % IV BOLUS
1000.0000 mL | Freq: Once | INTRAVENOUS | Status: AC
  Administered 2018-06-14: 1000 mL via INTRAVENOUS

## 2018-06-14 NOTE — ED Provider Notes (Signed)
[facility] EMERGENCY DEPARTMENT Provider Note   CSN: [id number] Arrival date & time: 06/13/18  1919    History   Chief Complaint Chief Complaint  Patient presents with  . Emesis    HPI Rebacca Colon is a 28 y.o. female.     HPI   Tara Colon is a 28 y.o. pregnant female at [redacted] weeks pregnant, G2P1Ab0,  who presents to the Emergency Department complaining of nausea and persistent vomiting.  Symptoms have been persistent for several days.  She reports 6-10 episodes of vomiting today and unable to keep down food or fluids.  She was seen 2 days ago at her obstetrician's office and had a confirmed IUP.  She has been given promethazine for her vomiting, but states the medication is not helping.  She denies abdominal or pelvic pain, vaginal spotting or bleeding, fever, chills, and back pain.  Past Medical History:  Diagnosis Date  . Anxiety   . Bipolar affect, depressed (HCC)   . BV (bacterial vaginosis)   . Depression   . Insomnia     Patient Active Problem List   Diagnosis Date Noted  . Encounter to determine fetal viability of pregnancy 05/22/2018  . Less than [redacted] weeks gestation of pregnancy 05/22/2018  . Pregnancy examination or test, positive result 05/22/2018  . 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 PTSD (post-traumatic stress disorder) 05/03/2017  . Bacterial vaginosis 02/20/2017  . 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 No pertinent surgical history.   OB History    Gravida  2   Para  1   Term  1   Preterm      AB      Living  1     SAB      TAB      Ectopic      Multiple      Live Births  1            Home Medications    Prior to Admission medications   Medication Sig Start Date End Date Taking? Authorizing Provider  prenatal vitamin w/FE, FA (PRENATAL 1 + 1) 27-1 MG TABS tablet Take 1 tablet by mouth daily at 12 noon. 05/22/18  Yes Adline Potter, NP  promethazine (PHENERGAN) 25 MG  tablet Take 1 tablet (25 mg total) by mouth every 6 (six) hours as needed for nausea or vomiting. 05/27/18  Yes Cyril Mourning A, NP  citalopram (CELEXA) 20 MG tablet Take 1 tablet (20 mg total) by mouth daily. Patient not taking: Reported on 06/13/2018 05/06/18   Remus Loffler, PA-C    Family History Family History  Problem Relation Age of Onset  . Anxiety disorder Father   . Depression Father   . Bipolar disorder Father   . Diabetes Maternal Grandmother   . Drug abuse Paternal Aunt   . Alcohol abuse Paternal Aunt   . Drug abuse Paternal Uncle   . Alcohol abuse Paternal Uncle   . Alcohol abuse Cousin   . Drug abuse Cousin     Social History Social History   Tobacco Use  . Smoking status: Former Smoker    Packs/day: 1.00    Years: 9.00    Pack years: 9.00    Types: Cigarettes  . Smokeless tobacco: Never Used  Substance Use Topics  . Alcohol use: Not Currently    Alcohol/week: 14.0 standard drinks    Types: 14 Cans of beer per week    Comment: 24  oz of beer per day; before pregnancy  . Drug use: Not Currently    Types: Marijuana     Allergies   Patient has no known allergies.   Review of Systems Review of Systems  Constitutional: Negative for appetite change, chills and fever.  Respiratory: Negative for shortness of breath.   Cardiovascular: Negative for chest pain and leg swelling.  Gastrointestinal: Positive for nausea and vomiting. Negative for abdominal pain, blood in stool and diarrhea.  Genitourinary: Negative for decreased urine volume, difficulty urinating, dysuria, flank pain, pelvic pain, vaginal bleeding and vaginal discharge.  Musculoskeletal: Negative for back pain.  Skin: Negative for color change and rash.  Neurological: Negative for dizziness, syncope, weakness and numbness.  Hematological: Negative for adenopathy.     Physical Exam Updated Vital Signs BP 129/87 (BP Location: Right Arm)   Pulse 77   Temp 98.5 F (36.9 C) (Oral)   Resp 16    LMP 04/19/2018   SpO2 100%   Physical Exam Vitals signs and nursing note reviewed.  Constitutional:      Appearance: Normal appearance. She is not ill-appearing.  HENT:     Mouth/Throat:     Mouth: Mucous membranes are moist.     Pharynx: Oropharynx is clear.  Neck:     Musculoskeletal: Normal range of motion.  Cardiovascular:     Rate and Rhythm: Normal rate and regular rhythm.     Pulses: Normal pulses.  Pulmonary:     Effort: Pulmonary effort is normal.     Breath sounds: Normal breath sounds.  Abdominal:     General: There is no distension.     Palpations: Abdomen is soft.     Tenderness: There is no abdominal tenderness. There is no guarding or rebound.  Musculoskeletal: Normal range of motion.     Right lower leg: No edema.     Left lower leg: No edema.  Skin:    General: Skin is warm.     Capillary Refill: Capillary refill takes less than 2 seconds.  Neurological:     General: No focal deficit present.     Mental Status: She is alert.      ED Treatments / Results  Labs (all labs ordered are listed, but only abnormal results are displayed) Labs Reviewed  CBC WITH DIFFERENTIAL/PLATELET - Abnormal; Notable for the following components:      Result Value   Hemoglobin 15.1 (*)    All other components within normal limits  URINALYSIS, ROUTINE W REFLEX MICROSCOPIC - Abnormal; Notable for the following components:   Color, Urine Tara (*)    APPearance HAZY (*)    Specific Gravity, Urine 1.032 (*)    Ketones, ur 80 (*)    Protein, ur 100 (*)    All other components within normal limits  COMPREHENSIVE METABOLIC PANEL    EKG None  Radiology US Ob Comp Less 14 Wks  Result Date: 06/11/2018 DATING AND VIABILITY SONOGRAM Tara Colon is a 28 y.o. year old G2P1001 with LMP 04/19/2018 which would correlate to  7+[redacted] weeks gestation.  She has regular menstrual cycles.   She is here today for a confirmatory initial sonogram. GESTATION:SINGLETON   FETAL ACTIVITY:           Heart rate         169        CERVIX: Appears closed ADNEXA: normal left ovary,right ovary not visualized GESTATIONAL AGE AND  BIOMETRICS: Gestational criteria: Estimated Date of Delivery: 01/24/2019 by LMP now  at 7+4 wks Previous Scans:0    CROWN RUMP LENGTH           21.25 mm         8+5 weeks                                                                      AVERAGE EGA(BY THIS SCAN):  8+5 weeks WORKING EDD( early ultrasound ):  01/16/2019  TECHNICIAN COMMENTS: [place] 8+5 wks,single IUP w/ys,positive fht 169 bpm,normal left ovary,right ovary not visualized,crl 21.25 mm A copy of this report including all images has been saved and backed up to a second source for retrieval if needed. All measures and details of the anatomical scan, placentation, fluid volume and pelvic anatomy are contained in that report. Tara Colon 06/11/2018 11:54 AM Clinical Impression and recommendations: I have reviewed the sonogram results above. Dating u/s which advances EDD by 8 days, to Estimated Date of Delivery: 01/16/19 Combined with the patient's current clinical course, below are my impressions and any appropriate recommendations for management based on the sonographic findings: FINDINGS: Intrauterine gestational sac: yes Single viable intrauterine pregnancy.                                 Yolk sac present      :  Visible Embryo: present    Visible Cardiac Activity: 169 bpm  Heart Rate:                              Subchorionic hemorrhage:   None visualized.                              Maternal uterus/adnexae: Ovaries are  within normal limits.                                                                       Right ovary not seen                                                                       Left ovary    seen                                                                       Free Fluid  n/a    Single   viable intrauterine pregnancy as above.  Estimated Date of Delivery:  01/16/19 determined by this u/s @mec @     Procedures Procedures (including critical care time)  Medications Ordered in ED Medications  sodium chloride 0.9 % bolus 1,000 mL (0 mLs Intravenous Stopped 06/13/18 2342)  ondansetron (ZOFRAN) injection 4 mg (4 mg Intravenous Given 06/13/18 2203)  sodium chloride 0.9 % bolus 1,000 mL (1,000 mLs Intravenous New Bag/Given 06/14/18 0014)     Initial Impression / Assessment and Plan / ED Course  I have reviewed the triage vital signs and the nursing notes.  Pertinent labs & imaging results that were available during my care of the patient were reviewed by me and considered in my medical decision making (see chart for details).        Pt is well appearing.  Vitals reassuring.  Mucous membranes are moist.  abd is soft, NT.  No reported vaginal bleeding or abdominal pain.  Had confirmed IUP 2 days ago by OB.  Here with symptoms of vomiting only.    Labs reassuring, she reports feeling better after IVF's.  She has tolerated po fluids and ate crackers.  No vomiting during ED stay.  Appropriate for d/c home, short course of Zofran prescribed, pt agrees to close f/u with her OB.  Return precautions discussed.     Final Clinical Impressions(s) / ED Diagnoses   Final diagnoses:  Vomiting during pregnancy    ED Discharge Orders    None       Pauline Aus, PA-C 06/14/18 0053    Vanetta Mulders, MD 06/26/18 [phone]

## 2018-06-14 NOTE — Discharge Instructions (Addendum)
Small, frequent sips of clear fluids.  Bland diet as tolerated.  Stop the promethazine.  Call your obstetrician's office tomorrow to arrange a follow-up appointment.  Return to the ER for any worsening symptoms.

## 2018-07-05 ENCOUNTER — Ambulatory Visit (INDEPENDENT_AMBULATORY_CARE_PROVIDER_SITE_OTHER): Payer: Medicaid Other | Admitting: Physician Assistant

## 2018-07-05 ENCOUNTER — Encounter: Payer: Self-pay | Admitting: Physician Assistant

## 2018-07-05 ENCOUNTER — Other Ambulatory Visit: Payer: Self-pay

## 2018-07-05 DIAGNOSIS — Z720 Tobacco use: Secondary | ICD-10-CM | POA: Diagnosis not present

## 2018-07-05 DIAGNOSIS — Z3490 Encounter for supervision of normal pregnancy, unspecified, unspecified trimester: Secondary | ICD-10-CM

## 2018-07-05 NOTE — Progress Notes (Signed)
    Telephone visit  Phone attempts 8:16 am no answer 8:19 AM no answer 8:42 AM no answer 9:10 AM no answer  Subjective: CC: Pregnancy PCP: Remus Loffler, PA-C [id number] Baro is a 28 y.o. female calls for telephone consult today. Patient provides verbal consent for consult held via phone.  Patient is identified with 2 separate identifiers.  At this time the entire area is on COVID-19 social distancing and stay home orders are in place.  Patient is of higher risk and therefore we are performing this by a virtual method.  Location of provider: home Location of patient: Home Others present for call: No  This was to be a follow-up appointment from previous visits with her.  She does have depression however at this time she has had to stop her medications because she is pregnant.  She has been doing fairly well.  She has been having a lot of nausea but they are treating that.  She admits that she is smoking a little bit more and is going to be working really hard on reducing that.  The stress of the COVID to stay at home orders is she admits has got her quite nervous.  Plus she is pregnant during this time.  At this time she really does not have any other concerns that we can help her with.  I have encouraged her to give [place] a call if she gets any infection or anything goes on and then if not we will see her in a few months.   ROS: Per HPI  No Known Allergies Past Medical History:  Diagnosis Date  . Anxiety   . Bipolar affect, depressed (HCC)   . BV (bacterial vaginosis)   . Depression   . Insomnia     Current Outpatient Medications:  .  citalopram (CELEXA) 20 MG tablet, Take 1 tablet (20 mg total) by mouth daily. (Patient not taking: Reported on 06/13/2018), Disp: 30 tablet, Rfl: 3 .  ondansetron (ZOFRAN) 4 MG tablet, Take 1 tablet (4 mg total) by mouth every 6 (six) hours., Disp: 10 tablet, Rfl: 0 .  prenatal vitamin w/FE, FA (PRENATAL 1 + 1) 27-1 MG TABS tablet, Take 1 tablet by  mouth daily at 12 noon., Disp: 30 each, Rfl: 12  Assessment/ Plan: 28 y.o. female   1. Pregnancy, unspecified gestational age Continue with OB/GYN  2. Tobacco abuse Counseled on reducing cigarettes by 1 cigarette/day, encouraged to quit if at all possible   Start time: 12:15 PM End time: 12:26 PM  No orders of the defined types were placed in this encounter.   Prudy Feeler PA-C [facility] [phone]

## 2018-07-15 ENCOUNTER — Other Ambulatory Visit: Payer: Self-pay | Admitting: Obstetrics & Gynecology

## 2018-07-15 ENCOUNTER — Encounter: Payer: Self-pay | Admitting: *Deleted

## 2018-07-15 DIAGNOSIS — Z3682 Encounter for antenatal screening for nuchal translucency: Secondary | ICD-10-CM

## 2018-07-16 ENCOUNTER — Ambulatory Visit: Payer: Medicaid Other | Admitting: *Deleted

## 2018-07-16 ENCOUNTER — Other Ambulatory Visit: Payer: Self-pay

## 2018-07-16 ENCOUNTER — Ambulatory Visit (INDEPENDENT_AMBULATORY_CARE_PROVIDER_SITE_OTHER): Payer: Medicaid Other

## 2018-07-16 ENCOUNTER — Ambulatory Visit (INDEPENDENT_AMBULATORY_CARE_PROVIDER_SITE_OTHER): Payer: Medicaid Other | Admitting: Women's Health

## 2018-07-16 ENCOUNTER — Encounter: Payer: Self-pay | Admitting: Women's Health

## 2018-07-16 VITALS — BP 120/71 | HR 70 | Wt 173.0 lb

## 2018-07-16 DIAGNOSIS — Z3482 Encounter for supervision of other normal pregnancy, second trimester: Secondary | ICD-10-CM | POA: Diagnosis not present

## 2018-07-16 DIAGNOSIS — Z1371 Encounter for nonprocreative screening for genetic disease carrier status: Secondary | ICD-10-CM

## 2018-07-16 DIAGNOSIS — Z3490 Encounter for supervision of normal pregnancy, unspecified, unspecified trimester: Secondary | ICD-10-CM | POA: Insufficient documentation

## 2018-07-16 DIAGNOSIS — O26891 Other specified pregnancy related conditions, first trimester: Secondary | ICD-10-CM

## 2018-07-16 DIAGNOSIS — Z3A13 13 weeks gestation of pregnancy: Secondary | ICD-10-CM | POA: Diagnosis not present

## 2018-07-16 DIAGNOSIS — Z331 Pregnant state, incidental: Secondary | ICD-10-CM

## 2018-07-16 DIAGNOSIS — F418 Other specified anxiety disorders: Secondary | ICD-10-CM | POA: Insufficient documentation

## 2018-07-16 DIAGNOSIS — Z1379 Encounter for other screening for genetic and chromosomal anomalies: Secondary | ICD-10-CM

## 2018-07-16 DIAGNOSIS — Z3682 Encounter for antenatal screening for nuchal translucency: Secondary | ICD-10-CM

## 2018-07-16 DIAGNOSIS — F17200 Nicotine dependence, unspecified, uncomplicated: Secondary | ICD-10-CM

## 2018-07-16 DIAGNOSIS — Z3481 Encounter for supervision of other normal pregnancy, first trimester: Secondary | ICD-10-CM

## 2018-07-16 DIAGNOSIS — R768 Other specified abnormal immunological findings in serum: Secondary | ICD-10-CM | POA: Insufficient documentation

## 2018-07-16 DIAGNOSIS — N898 Other specified noninflammatory disorders of vagina: Secondary | ICD-10-CM | POA: Diagnosis not present

## 2018-07-16 DIAGNOSIS — Z1389 Encounter for screening for other disorder: Secondary | ICD-10-CM

## 2018-07-16 DIAGNOSIS — F0630 Mood disorder due to known physiological condition, unspecified: Secondary | ICD-10-CM

## 2018-07-16 DIAGNOSIS — Z363 Encounter for antenatal screening for malformations: Secondary | ICD-10-CM

## 2018-07-16 LAB — POCT URINALYSIS DIPSTICK OB
Blood, UA: NEGATIVE
Glucose, UA: NEGATIVE
Ketones, UA: NEGATIVE
Leukocytes, UA: NEGATIVE
Nitrite, UA: NEGATIVE

## 2018-07-16 LAB — POCT WET PREP (WET MOUNT)
Clue Cells Wet Prep Whiff POC: POSITIVE
Trichomonas Wet Prep HPF POC: ABSENT

## 2018-07-16 MED ORDER — METRONIDAZOLE 0.75 % VA GEL: 1.0000 | Freq: Every day | VAGINAL | 0 refills | Status: DC

## 2018-07-16 MED ORDER — DOXYLAMINE-PYRIDOXINE 10-10 MG PO TBEC: DELAYED_RELEASE_TABLET | ORAL | 6 refills | Status: DC

## 2018-07-16 MED ORDER — CITALOPRAM HYDROBROMIDE 20 MG PO TABS: 20.0000 mg | ORAL_TABLET | Freq: Every day | ORAL | 3 refills | Status: DC

## 2018-07-16 MED ORDER — BLOOD PRESSURE MONITOR MISC: 0 refills | Status: DC

## 2018-07-16 NOTE — Progress Notes (Signed)
INITIAL OBSTETRICAL VISIT Patient name: Tara Colon Odriscoll MRN [id number]  Date of birth: [birth year] Chief Complaint:   Initial Prenatal Visit (?BV-odor)  History of Present Illness:   Tara Colon Bernabei is a 28 y.o. 742P1001 African American female at 9030w5d by 8wk u/s, with an Estimated Date of Delivery: 01/16/19 being seen today for her initial obstetrical visit.   Her obstetrical history is significant for term uncomplicated VAVB.   Today she reports vaginal odor, no itching/irritation.  Dep/anx- was on celexa, quit q/ +PT-wasn't sure if it was safe. Smoker 2ppd- now down to 1ppd and wants to quit, but feels she is stressed d/t being off meds. Nausea- requests meds Patient's last menstrual period was 04/19/2018. Last pap Nov 2019. Results were: normal Review of Systems:   Pertinent items are noted in HPI Denies cramping/contractions, leakage of fluid, vaginal bleeding, abnormal vaginal discharge w/ itching/odor/irritation, headaches, visual changes, shortness of breath, chest pain, abdominal pain, severe nausea/vomiting, or problems with urination or bowel movements unless otherwise stated above.  Pertinent History Reviewed:  Reviewed past medical,surgical, social, obstetrical and family history.  Reviewed problem list, medications and allergies. OB History  Gravida Para Term Preterm AB Living  2 1 1     1   SAB TAB Ectopic Multiple Live Births          1    # Outcome Date GA Lbr Len/2nd Weight Sex Delivery Anes PTL Lv  2 Current           1 Term 10/05/15 7229w6d  7 lb (3.175 kg) F Vag-Vacuum None N LIV   Physical Assessment:   Vitals:   07/16/18 0954  BP: 120/71  Pulse: 70  Weight: 173 lb (78.5 kg)  Body mass index is 26.3 kg/m.       Physical Examination:  General appearance - well appearing, and in no distress  Mental status - alert, oriented to person, place, and time  Psych:  She has a normal mood and affect  Skin - warm and dry, normal color, no suspicious lesions noted  Chest  - effort normal, all lung fields clear to auscultation bilaterally  Heart - normal rate and regular rhythm  Abdomen - soft, nontender  Extremities:  No swelling or varicosities noted  Pelvic - VULVA: normal appearing vulva with no masses, tenderness or lesions  VAGINA: normal appearing vagina with normal color and thin white malodorous discharge, no lesions  CERVIX: normal appearing cervix without discharge or lesions, no CMT  Thin prep pap is not done   Fetal Heart Rate (bpm): 152 u/s via u/s  Results for orders placed or performed in visit on 07/16/18 (from the past 24 hour(s))  POC Urinalysis Dipstick OB   Collection Time: 07/16/18  9:55 AM  Result Value Ref Range   Color, UA     Clarity, UA     Glucose, UA Negative Negative   Bilirubin, UA     Ketones, UA neg    Spec Grav, UA     Blood, UA neg    pH, UA     POC,PROTEIN,UA Small (1+) Negative, Trace, Small (1+), Moderate (2+), Large (3+), 4+   Urobilinogen, UA     Nitrite, UA neg    Leukocytes, UA Negative Negative   Appearance     Odor    POCT Wet Prep Mellody Drown([place])   Collection Time: 07/16/18 12:44 PM  Result Value Ref Range   Source Wet Prep POC vaginal    WBC, Wet Prep HPF POC  few    Bacteria Wet Prep HPF POC Few Few   BACTERIA WET PREP MORPHOLOGY POC     Clue Cells Wet Prep HPF POC Many (A) None   Clue Cells Wet Prep Whiff POC Positive Whiff    Yeast Wet Prep HPF POC None None   KOH Wet Prep POC     Trichomonas Wet Prep HPF POC Absent Absent   Today's NT [place] 13+5 wks,measurements c/w dates,crl 76.40 mm,NB present,NT 2 mm,posterior placenta gr 0,fhr 152 bpm Assessment & Plan:  1) Low-Risk Pregnancy G2P1001 at [redacted]w[redacted]d with an Estimated Date of Delivery: 01/16/19   2) Initial OB visit  3) BV> wants gel, rx metrogel  4) Dep/anx> rx celexa (to restart)  5) Nausea> rx diclegis  6) Smoker>2ppd down to 1ppd, wants [place], form faxed  Meds:  Meds ordered this encounter  Medications  . Blood Pressure Monitor  MISC    Sig: For regular home bp monitoring during pregnancy    Dispense:  1 each    Refill:  0    Dx: z34.90    Order Specific Question:   Supervising Provider    Answer:   Despina Hidden, LUTHER H [[id number]]  . citalopram (CELEXA) 20 MG tablet    Sig: Take 1 tablet (20 mg total) by mouth daily. Take 1/2 tablet (10mg ) by mouth daily x 7days, then 1 tablet (20mg ) by mouth daily thereafter    Dispense:  30 tablet    Refill:  3    Order Specific Question:   Supervising Provider    Answer:   Despina Hidden, LUTHER H [[id number]]  . Doxylamine-Pyridoxine (DICLEGIS) 10-10 MG TBEC    Sig: 2 tabs q hs, if sx persist add 1 tab q am on day 3, if sx persist add 1 tab q afternoon on day 4    Dispense:  100 tablet    Refill:  6    Order Specific Question:   Supervising Provider    Answer:   Despina Hidden, LUTHER H [[id number]]  . metroNIDAZOLE (METROGEL VAGINAL) 0.75 % vaginal gel    Sig: Place 1 Applicatorful vaginally at bedtime. X 5 nights, no sex while using    Dispense:  70 g    Refill:  0    Order Specific Question:   Supervising Provider    Answer:   Duane Lope H [[id number]]    Initial labs obtained Continue prenatal vitamins Reviewed n/v relief measures and warning s/s to report Reviewed recommended weight gain based on pre-gravid BMI Encouraged well-balanced diet Genetic Screening discussed: requested nt/it, MaterniT21 Cystic fibrosis, SMA, Fragile X screening discussed requested Ultrasound discussed; fetal survey: requested CCNC completed>PCM not here, form faxed Doesn't have bp cuff, rx faxed to [facility], check bp weekly, let us know if >140/90  Follow-up: Return in about 5 weeks (around 08/20/2018) for KW:IOXBDZH, 2nd IT, LROB webex.   Orders Placed This Encounter  Procedures  . GC/Chlamydia Probe Amp  . Urine Culture  . US OB Comp + 14 Wk  . Obstetric Panel, Including HIV  . Urinalysis, Routine w reflex microscopic  . Sickle cell screen  . Integrated 1  . Pain Management Screening Profile (10S)  . MaterniT 21  plus Core, Blood  . Inheritest Core(CF97,SMA,FraX)  . POC Urinalysis Dipstick OB  . POCT Wet Prep [facility] [place])    Cheral Marker CNM, [facility] 07/16/2018 12:47 PM

## 2018-07-16 NOTE — Patient Instructions (Signed)
Tara Colon, I greatly value your feedback.  If you receive a survey following your visit with [place] today, we appreciate you taking the time to fill it out.  Thanks, Tara Colon, CNM, [facility]  [facility] HAS MOVED!!! It is now [facility] ([address] [place], [place] [address]) Entrance located off of [address] Free 24/7 valet parking    Nausea & Vomiting  Have saltine crackers or pretzels by your bed and eat a few bites before you raise your head out of bed in the morning  Eat small frequent meals throughout the day instead of large meals  Drink plenty of fluids throughout the day to stay hydrated, just don't drink a lot of fluids with your meals.  This can make your stomach fill up faster making you feel sick  Do not brush your teeth right after you eat  Products with real ginger are good for nausea, like ginger ale and ginger hard candy Make sure it says made with real ginger!  Sucking on sour candy like lemon heads is also good for nausea  If your prenatal vitamins make you nauseated, take them at night so you will sleep through the nausea  Sea Bands  If you feel like you need medicine for the nausea & vomiting please let us know  If you are unable to keep any fluids or food down please let us know   Constipation  Drink plenty of fluid, preferably water, throughout the day  Eat foods high in fiber such as fruits, vegetables, and grains  Exercise, such as walking, is a good way to keep your bowels regular  Drink warm fluids, especially warm prune juice, or decaf coffee  Eat a 1/2 cup of real oatmeal (not instant), 1/2 cup applesauce, and 1/2-1 cup warm prune juice every day  If needed, you may take Colace (docusate sodium) stool softener once or twice a day to help keep the stool soft. If you are pregnant, wait until you are out of your first trimester (12-14 weeks of pregnancy)  If you still are having problems with  constipation, you may take Miralax once daily as needed to help keep your bowels regular.  If you are pregnant, wait until you are out of your first trimester (12-14 weeks of pregnancy)  Home Blood Pressure Monitoring for Patients   Your provider has recommended that you check your blood pressure (BP) at least once a week at home. If you do not have a blood pressure cuff at home, one will be provided for you. Contact your provider if you have not received your monitor within 1 week.   Helpful Tips for Accurate Home Blood Pressure Checks  . Don't smoke, exercise, or drink caffeine 30 minutes before checking your BP . Use the restroom before checking your BP (a full bladder can raise your pressure) . Relax in a comfortable upright chair . Feet on the ground . Left arm resting comfortably on a flat surface at the level of your heart . Legs uncrossed . Back supported . Sit quietly and don't talk . Place the cuff on your bare arm . Adjust snuggly, so that only two fingertips can fit between your skin and the top of the cuff . Check 2 readings separated by at least one minute . Keep a log of your BP readings . For a visual, please reference this diagram: http://ccnc.care/bpdiagram  Provider Name: [facility]     Phone: [phone]  Zone 1: ALL CLEAR  Continue to monitor your symptoms:  . BP reading is less than 140 (top number) or less than 90 (bottom number)  . No right upper stomach pain . No headaches or seeing spots . No feeling nauseated or throwing up . No swelling in face and hands  Zone 2: CAUTION Call your doctor's office for any of the following:  . BP reading is greater than 140 (top number) or greater than 90 (bottom number)  . Stomach pain under your ribs in the middle or right side . Headaches or seeing spots . Feeling nauseated or throwing up . Swelling in face and hands  Zone 3: EMERGENCY  Seek immediate medical care if you have any of the following:  . BP  reading is greater than160 (top number) or greater than 110 (bottom number) . Severe headaches not improving with Tylenol . Serious difficulty catching your breath . Any worsening symptoms from Zone 2     First Trimester of Pregnancy The first trimester of pregnancy is from week 1 until the end of week 12 (months 1 through 3). A week after a sperm fertilizes an egg, the egg will implant on the wall of the uterus. This embryo will begin to develop into a baby. Genes from you and your partner are forming the baby. The female genes determine whether the baby is a boy or a girl. At 6-8 weeks, the eyes and face are formed, and the heartbeat can be seen on ultrasound. At the end of 12 weeks, all the baby's organs are formed.  Now that you are pregnant, you will want to do everything you can to have a healthy baby. Two of the most important things are to get good prenatal care and to follow your health care provider's instructions. Prenatal care is all the medical care you receive before the baby's birth. This care will help prevent, find, and treat any problems during the pregnancy and childbirth. BODY CHANGES Your body goes through many changes during pregnancy. The changes vary from woman to woman.   You may gain or lose a couple of pounds at first.  You may feel sick to your stomach (nauseous) and throw up (vomit). If the vomiting is uncontrollable, call your health care provider.  You may tire easily.  You may develop headaches that can be relieved by medicines approved by your health care provider.  You may urinate more often. Painful urination may mean you have a bladder infection.  You may develop heartburn as a result of your pregnancy.  You may develop constipation because certain hormones are causing the muscles that push waste through your intestines to slow down.  You may develop hemorrhoids or swollen, bulging veins (varicose veins).  Your breasts may begin to grow larger and become  tender. Your nipples may stick out more, and the tissue that surrounds them (areola) may become darker.  Your gums may bleed and may be sensitive to brushing and flossing.  Dark spots or blotches (chloasma, mask of pregnancy) may develop on your face. This will likely fade after the baby is born.  Your menstrual periods will stop.  You may have a loss of appetite.  You may develop cravings for certain kinds of food.  You may have changes in your emotions from day to day, such as being excited to be pregnant or being concerned that something may go wrong with the pregnancy and baby.  You may have more vivid and strange dreams.  You may have changes in your hair. These can include thickening of your hair, rapid growth, and changes in texture. Some women also have hair loss during or after pregnancy, or hair that feels dry or thin. Your hair will most likely return to normal after your baby is born. WHAT TO EXPECT AT YOUR PRENATAL VISITS During a routine prenatal visit:  You will be weighed to make sure you and the baby are growing normally.  Your blood pressure will be taken.  Your abdomen will be measured to track your baby's growth.  The fetal heartbeat will be listened to starting around week 10 or 12 of your pregnancy.  Test results from any previous visits will be discussed. Your health care provider may ask you:  How you are feeling.  If you are feeling the baby move.  If you have had any abnormal symptoms, such as leaking fluid, bleeding, severe headaches, or abdominal cramping.  If you have any questions. Other tests that may be performed during your first trimester include:  Blood tests to find your blood type and to check for the presence of any previous infections. They will also be used to check for low iron levels (anemia) and Rh antibodies. Later in the pregnancy, blood tests for diabetes will be done along with other tests if problems develop.  Urine tests to  check for infections, diabetes, or protein in the urine.  An ultrasound to confirm the proper growth and development of the baby.  An amniocentesis to check for possible genetic problems.  Fetal screens for spina bifida and Down syndrome.  You may need other tests to make sure you and the baby are doing well. HOME CARE INSTRUCTIONS  Medicines  Follow your health care provider's instructions regarding medicine use. Specific medicines may be either safe or unsafe to take during pregnancy.  Take your prenatal vitamins as directed.  If you develop constipation, try taking a stool softener if your health care provider approves. Diet  Eat regular, well-balanced meals. Choose a variety of foods, such as meat or vegetable-based protein, fish, milk and low-fat dairy products, vegetables, fruits, and whole grain breads and cereals. Your health care provider will help you determine the amount of weight gain that is right for you.  Avoid raw meat and uncooked cheese. These carry germs that can cause birth defects in the baby.  Eating four or five small meals rather than three large meals a day may help relieve nausea and vomiting. If you start to feel nauseous, eating a few soda crackers can be helpful. Drinking liquids between meals instead of during meals also seems to help nausea and vomiting.  If you develop constipation, eat more high-fiber foods, such as fresh vegetables or fruit and whole grains. Drink enough fluids to keep your urine clear or pale yellow. Activity and Exercise  Exercise only as directed by your health care provider. Exercising will help you:  Control your weight.  Stay in shape.  Be prepared for labor and delivery.  Experiencing pain or cramping in the lower abdomen or low back is a good sign that you should stop exercising. Check with your health care provider before continuing normal exercises.  Try to avoid standing for long periods of time. Move your legs often  if you must stand in one place for a long time.  Avoid heavy lifting.  Wear low-heeled shoes, and practice good posture.  You may continue to have sex unless your health care provider directs you otherwise.  Relief of Pain or Discomfort  Wear a good support bra for breast tenderness.    Take warm sitz baths to soothe any pain or discomfort caused by hemorrhoids. Use hemorrhoid cream if your health care provider approves.    Rest with your legs elevated if you have leg cramps or low back pain.  If you develop varicose veins in your legs, wear support hose. Elevate your feet for 15 minutes, 3-4 times a day. Limit salt in your diet. Prenatal Care  Schedule your prenatal visits by the twelfth week of pregnancy. They are usually scheduled monthly at first, then more often in the last 2 months before delivery.  Write down your questions. Take them to your prenatal visits.  Keep all your prenatal visits as directed by your health care provider. Safety  Wear your seat belt at all times when driving.  Make a list of emergency phone numbers, including numbers for family, friends, the hospital, and police and fire departments. General Tips  Ask your health care provider for a referral to a local prenatal education class. Begin classes no later than at the beginning of month 6 of your pregnancy.  Ask for help if you have counseling or nutritional needs during pregnancy. Your health care provider can offer advice or refer you to specialists for help with various needs.  Do not use hot tubs, steam rooms, or saunas.  Do not douche or use tampons or scented sanitary pads.  Do not cross your legs for long periods of time.  Avoid cat litter boxes and soil used by cats. These carry germs that can cause birth defects in the baby and possibly loss of the fetus by miscarriage or stillbirth.  Avoid all smoking, herbs, alcohol, and medicines not prescribed by your health care provider. Chemicals in  these affect the formation and growth of the baby.  Schedule a dentist appointment. At home, brush your teeth with a soft toothbrush and be gentle when you floss. SEEK MEDICAL CARE IF:   You have dizziness.  You have mild pelvic cramps, pelvic pressure, or nagging pain in the abdominal area.  You have persistent nausea, vomiting, or diarrhea.  You have a bad smelling vaginal discharge.  You have pain with urination.  You notice increased swelling in your face, hands, legs, or ankles. SEEK IMMEDIATE MEDICAL CARE IF:   You have a fever.  You are leaking fluid from your vagina.  You have spotting or bleeding from your vagina.  You have severe abdominal cramping or pain.  You have rapid weight gain or loss.  You vomit blood or material that looks like coffee grounds.  You are exposed to [place] measles and have never had them.  You are exposed to fifth disease or chickenpox.  You develop a severe headache.  You have shortness of breath.  You have any kind of trauma, such as from a fall or a car accident. Document Released: 02/28/2001 Document Revised: 07/21/2013 Document Reviewed: 01/14/2013 [facility] Patient Information 2015 [place], [place]. This information is not intended to replace advice given to you by your health care provider. Make sure you discuss any questions you have with your health care provider.  Coronavirus (COVID-19) Are you at risk?  Are you at risk for the Coronavirus (COVID-19)?  To be considered HIGH RISK for Coronavirus (COVID-19), you have to meet the following criteria:  . Traveled to [place], [place], [place], [place] or [place]; or in the [place] to [place], [place], [place], or  [place]; and have fever, cough, and shortness of breath within the last 2 weeks of travel OR . Been in close contact with a person diagnosed with COVID-19 within the last 2 weeks and have fever, cough, and shortness of breath . IF YOU DO NOT MEET THESE  CRITERIA, YOU ARE CONSIDERED LOW RISK FOR COVID-19.  What to do if you are HIGH RISK for COVID-19?  Marland Kitchen If you are having a medical emergency, call 911. . Seek medical care right away. Before you go to a doctor's office, urgent care or emergency department, call ahead and tell them about your recent travel, contact with someone diagnosed with COVID-19, and your symptoms. You should receive instructions from your physician's office regarding next steps of care.  . When you arrive at healthcare provider, tell the healthcare staff immediately you have returned from visiting [place], [place], [place], [place] or [place]; or traveled in the [place] to [place], [place], [place], or [place]; in the last two weeks or you have been in close contact with a person diagnosed with COVID-19 in the last 2 weeks.   . Tell the health care staff about your symptoms: fever, cough and shortness of breath. . After you have been seen by a medical provider, you will be either: o Tested for (COVID-19) and discharged home on quarantine except to seek medical care if symptoms worsen, and asked to  - Stay home and avoid contact with others until you get your results (4-5 days)  - Avoid travel on public transportation if possible (such as bus, train, or airplane) or o Sent to the Emergency Department by EMS for evaluation, COVID-19 testing, and possible admission depending on your condition and test results.  What to do if you are LOW RISK for COVID-19?  Reduce your risk of any infection by using the same precautions used for avoiding the common cold or flu:  Marland Kitchen Wash your hands often with soap and warm water for at least 20 seconds.  If soap and water are not readily available, use an alcohol-based hand sanitizer with at least 60% alcohol.  . If coughing or sneezing, cover your mouth and nose by coughing or sneezing into the elbow areas of your shirt or coat, into a tissue or into your sleeve (not your hands). .  Avoid shaking hands with others and consider head nods or verbal greetings only. . Avoid touching your eyes, nose, or mouth with unwashed hands.  . Avoid close contact with people who are sick. . Avoid places or events with large numbers of people in one location, like concerts or sporting events. . Carefully consider travel plans you have or are making. . If you are planning any travel outside or inside the [place], visit the CDC's Travelers' Health webpage for the latest health notices. . If you have some symptoms but not all symptoms, continue to monitor at home and seek medical attention if your symptoms worsen. . If you are having a medical emergency, call 911.   ADDITIONAL HEALTHCARE OPTIONS FOR PATIENTS  [facility] / e-Visit: [email or web address]         [facility]: [phone]  [facility]: [phone]                   [facility]: [phone]

## 2018-07-16 NOTE — Progress Notes (Signed)
[place] 13+5 wks,measurements c/w dates,crl 76.40 mm,NB present,NT 2 mm,posterior placenta gr 0,fhr 152 bpm

## 2018-07-17 ENCOUNTER — Encounter: Payer: Self-pay | Admitting: Women's Health

## 2018-07-17 DIAGNOSIS — F1290 Cannabis use, unspecified, uncomplicated: Secondary | ICD-10-CM | POA: Insufficient documentation

## 2018-07-17 LAB — PMP SCREEN PROFILE (10S), URINE
Amphetamine Scrn, Ur: NEGATIVE ng/mL
BARBITURATE SCREEN URINE: NEGATIVE ng/mL
BENZODIAZEPINE SCREEN, URINE: NEGATIVE ng/mL
CANNABINOIDS UR QL SCN: POSITIVE ng/mL — AB
Cocaine (Metab) Scrn, Ur: NEGATIVE ng/mL
Creatinine(Crt), U: 416.5 mg/dL — ABNORMAL HIGH (ref 20.0–300.0)
Methadone Screen, Urine: NEGATIVE ng/mL
OXYCODONE+OXYMORPHONE UR QL SCN: NEGATIVE ng/mL
Opiate Scrn, Ur: NEGATIVE ng/mL
Ph of Urine: 5.4 (ref 4.5–8.9)
Phencyclidine Qn, Ur: NEGATIVE ng/mL
Propoxyphene Scrn, Ur: NEGATIVE ng/mL

## 2018-07-17 LAB — MED LIST OPTION NOT SELECTED

## 2018-07-18 LAB — INTEGRATED 1
Crown Rump Length: 76.4 mm
Gest. Age on Collection Date: 13.4 weeks
Maternal Age at EDD: 27.1 yr
Nuchal Translucency (NT): 2 mm
Number of Fetuses: 1
PAPP-A Value: 964.8 ng/mL
Weight: 173 [lb_av]

## 2018-07-18 LAB — MICROSCOPIC EXAMINATION
Casts: NONE SEEN /lpf
Epithelial Cells (non renal): >10 /hpf — AB (ref 0–10)

## 2018-07-18 LAB — OBSTETRIC PANEL, INCLUDING HIV
Antibody Screen: NEGATIVE
Basophils Absolute: 0 10*3/uL (ref 0.0–0.2)
Basos: 0 %
EOS (ABSOLUTE): 0.2 10*3/uL (ref 0.0–0.4)
Eos: 4 %
HIV Screen 4th Generation wRfx: NONREACTIVE
Hematocrit: 37.6 % (ref 34.0–46.6)
Hemoglobin: 12.9 g/dL (ref 11.1–15.9)
Hepatitis B Surface Ag: NEGATIVE
Immature Grans (Abs): 0 10*3/uL (ref 0.0–0.1)
Immature Granulocytes: 0 %
Lymphocytes Absolute: 1.6 10*3/uL (ref 0.7–3.1)
Lymphs: 27 %
MCH: 30.9 pg (ref 26.6–33.0)
MCHC: 34.3 g/dL (ref 31.5–35.7)
MCV: 90 fL (ref 79–97)
Monocytes Absolute: 0.6 10*3/uL (ref 0.1–0.9)
Monocytes: 11 %
Neutrophils Absolute: 3.5 10*3/uL (ref 1.4–7.0)
Neutrophils: 58 %
Platelets: 291 10*3/uL (ref 150–450)
RBC: 4.18 x10E6/uL (ref 3.77–5.28)
RDW: 11.9 % (ref 11.7–15.4)
RPR Ser Ql: NONREACTIVE
Rh Factor: POSITIVE
Rubella Antibodies, IGG: 3.42 index (ref >0.99)
WBC: 6 10*3/uL (ref 3.4–10.8)

## 2018-07-18 LAB — URINALYSIS, ROUTINE W REFLEX MICROSCOPIC
Bilirubin, UA: NEGATIVE
Glucose, UA: NEGATIVE
Ketones, UA: NEGATIVE
Leukocytes,UA: NEGATIVE
Nitrite, UA: NEGATIVE
RBC, UA: NEGATIVE
Specific Gravity, UA: >=1.03 — AB (ref 1.005–1.030)
Urobilinogen, Ur: 1 mg/dL (ref 0.2–1.0)
pH, UA: 5 (ref 5.0–7.5)

## 2018-07-18 LAB — URINE CULTURE

## 2018-07-18 LAB — GC/CHLAMYDIA PROBE AMP
Chlamydia trachomatis, NAA: NEGATIVE
Neisseria Gonorrhoeae by PCR: NEGATIVE

## 2018-07-18 LAB — SICKLE CELL SCREEN: Sickle Cell Screen: NEGATIVE

## 2018-07-19 DIAGNOSIS — Z3490 Encounter for supervision of normal pregnancy, unspecified, unspecified trimester: Secondary | ICD-10-CM | POA: Diagnosis not present

## 2018-07-30 LAB — MATERNIT 21 PLUS CORE, BLOOD
Fetal Fraction: 9
Result (T21): NEGATIVE
Trisomy 13 (Patau syndrome): NEGATIVE
Trisomy 18 (Edwards syndrome): NEGATIVE
Trisomy 21 (Down syndrome): NEGATIVE

## 2018-07-30 LAB — INHERITEST CORE(CF97,SMA,FRAX)

## 2018-08-19 ENCOUNTER — Encounter: Payer: Self-pay | Admitting: *Deleted

## 2018-08-20 ENCOUNTER — Other Ambulatory Visit: Payer: Self-pay

## 2018-08-20 ENCOUNTER — Ambulatory Visit (INDEPENDENT_AMBULATORY_CARE_PROVIDER_SITE_OTHER): Payer: Medicaid Other

## 2018-08-20 ENCOUNTER — Ambulatory Visit (INDEPENDENT_AMBULATORY_CARE_PROVIDER_SITE_OTHER): Payer: Medicaid Other | Admitting: Obstetrics & Gynecology

## 2018-08-20 VITALS — BP 112/70 | HR 82 | Wt 178.0 lb

## 2018-08-20 DIAGNOSIS — Z3A18 18 weeks gestation of pregnancy: Secondary | ICD-10-CM | POA: Diagnosis not present

## 2018-08-20 DIAGNOSIS — Z331 Pregnant state, incidental: Secondary | ICD-10-CM

## 2018-08-20 DIAGNOSIS — Z3482 Encounter for supervision of other normal pregnancy, second trimester: Secondary | ICD-10-CM

## 2018-08-20 DIAGNOSIS — Z1389 Encounter for screening for other disorder: Secondary | ICD-10-CM

## 2018-08-20 DIAGNOSIS — Z363 Encounter for antenatal screening for malformations: Secondary | ICD-10-CM

## 2018-08-20 DIAGNOSIS — Z3481 Encounter for supervision of other normal pregnancy, first trimester: Secondary | ICD-10-CM

## 2018-08-20 DIAGNOSIS — Z1379 Encounter for other screening for genetic and chromosomal anomalies: Secondary | ICD-10-CM | POA: Diagnosis not present

## 2018-08-20 LAB — POCT URINALYSIS DIPSTICK OB
Blood, UA: NEGATIVE
Glucose, UA: NEGATIVE
Ketones, UA: NEGATIVE
Leukocytes, UA: NEGATIVE
Nitrite, UA: NEGATIVE

## 2018-08-20 NOTE — Progress Notes (Signed)
[place] 18+5 wks,cephalic,cx 3.4 cm,posterior placenta,LVEICF 2 mm,SVP of fluid 4.5 cm,normal ovaries bilat,fhr 135 bpm,efw 279 g 73%,anatomy complete

## 2018-08-20 NOTE — Progress Notes (Signed)
   LOW-RISK PREGNANCY VISIT Patient name: Tara Colon MRN [id number]  Date of birth: 22-Apr-1990 Chief Complaint:   Routine Prenatal Visit (2nd IT)  History of Present Illness:   Tara Colon is a 28 y.o. G79P1001 female at [redacted]w[redacted]d with an Estimated Date of Delivery: 01/16/19 being seen today for ongoing management of a low-risk pregnancy.  Today she reports no complaints. Contractions: Not present. Vag. Bleeding: None.  Movement: Present. denies leaking of fluid. Review of Systems:   Pertinent items are noted in HPI Denies abnormal vaginal discharge w/ itching/odor/irritation, headaches, visual changes, shortness of breath, chest pain, abdominal pain, severe nausea/vomiting, or problems with urination or bowel movements unless otherwise stated above. Pertinent History Reviewed:  Reviewed past medical,surgical, social, obstetrical and family history.  Reviewed problem list, medications and allergies. Physical Assessment:   Vitals:   08/20/18 1029  BP: 112/70  Pulse: 82  Weight: 178 lb (80.7 kg)  Body mass index is 27.06 kg/m.        Physical Examination:   General appearance: Well appearing, and in no distress  Mental status: Alert, oriented to person, place, and time  Skin: Warm & dry  Cardiovascular: Normal heart rate noted  Respiratory: Normal respiratory effort, no distress  Abdomen: Soft, gravid, nontender  Pelvic: Cervical exam deferred         Extremities: Edema: None  Fetal Status:     Movement: Present    Results for orders placed or performed in visit on 08/20/18 (from the past 24 hour(s))  POC Urinalysis Dipstick OB   Collection Time: 08/20/18 10:30 AM  Result Value Ref Range   Color, UA     Clarity, UA     Glucose, UA Negative Negative   Bilirubin, UA     Ketones, UA neg    Spec Grav, UA     Blood, UA neg    pH, UA     POC,PROTEIN,UA Small (1+) Negative, Trace, Small (1+), Moderate (2+), Large (3+), 4+   Urobilinogen, UA     Nitrite, UA neg    Leukocytes,  UA Negative Negative   Appearance     Odor      Assessment & Plan:  1) Low-risk pregnancy G2P1001 at [redacted]w[redacted]d with an Estimated Date of Delivery: 01/16/19   2) sonogram is normal isolated LV EICF   Meds: No orders of the defined types were placed in this encounter.  Labs/procedures today:   Plan:  Continue routine obstetrical care   Reviewed:  labor symptoms and general obstetric precautions including but not limited to vaginal bleeding, contractions, leaking of fluid and fetal movement were reviewed in detail with the patient.  All questions were answered  Follow-up: Return in about 4 weeks (around 09/17/2018) for webex visit,, LROB.  Orders Placed This Encounter  Procedures  . INTEGRATED 2  . POC Urinalysis Dipstick OB   Tara Colon  08/20/2018 10:53 AM

## 2018-08-21 ENCOUNTER — Telehealth: Payer: Self-pay | Admitting: Women's Health

## 2018-08-21 NOTE — Telephone Encounter (Signed)
Patient called stating that she is having frequent Urination and she is burning when she uses the restroom. Pt states that there is not bleeding. Please contact pt

## 2018-08-21 NOTE — Telephone Encounter (Signed)
Attempted to call patient back and phone went straight to vm. Left message to call back.

## 2018-08-22 ENCOUNTER — Other Ambulatory Visit: Payer: Medicaid Other

## 2018-08-22 ENCOUNTER — Other Ambulatory Visit: Payer: Self-pay

## 2018-08-22 DIAGNOSIS — R350 Frequency of micturition: Secondary | ICD-10-CM | POA: Diagnosis not present

## 2018-08-22 DIAGNOSIS — R300 Dysuria: Secondary | ICD-10-CM | POA: Diagnosis not present

## 2018-08-22 LAB — INTEGRATED 2
AFP MoM: 1.28
Alpha-Fetoprotein: 54.2 ng/mL
Crown Rump Length: 76.4 mm
DIA MoM: 1.74
DIA Value: 271.1 pg/mL
Estriol, Unconjugated: 1.32 ng/mL
Gest. Age on Collection Date: 13.4 weeks
Gestational Age: 18.4 weeks
Maternal Age at EDD: 27.1 yr
Nuchal Translucency (NT): 2 mm
Nuchal Translucency MoM: 1.13
Number of Fetuses: 1
PAPP-A MoM: 0.8
PAPP-A Value: 964.8 ng/mL
Test Results:: NEGATIVE
Weight: 173 [lb_av]
Weight: 173 [lb_av]
hCG MoM: 1.46
hCG Value: 33 IU/mL
uE3 MoM: 0.92

## 2018-08-23 ENCOUNTER — Other Ambulatory Visit: Payer: Self-pay

## 2018-08-23 ENCOUNTER — Encounter: Payer: Self-pay | Admitting: Family Medicine

## 2018-08-23 ENCOUNTER — Ambulatory Visit: Payer: Medicaid Other | Admitting: Family Medicine

## 2018-08-23 VITALS — BP 113/62 | HR 81 | Temp 97.8°F | Ht 68.0 in | Wt 180.4 lb

## 2018-08-23 DIAGNOSIS — B9689 Other specified bacterial agents as the cause of diseases classified elsewhere: Secondary | ICD-10-CM

## 2018-08-23 DIAGNOSIS — R300 Dysuria: Secondary | ICD-10-CM

## 2018-08-23 DIAGNOSIS — N760 Acute vaginitis: Secondary | ICD-10-CM | POA: Diagnosis not present

## 2018-08-23 LAB — MICROSCOPIC EXAMINATION: Renal Epithel, UA: NONE SEEN /hpf

## 2018-08-23 LAB — URINALYSIS, COMPLETE
Bilirubin, UA: NEGATIVE
Glucose, UA: NEGATIVE
Leukocytes,UA: NEGATIVE
Nitrite, UA: NEGATIVE
Specific Gravity, UA: >1.03 — ABNORMAL HIGH (ref 1.005–1.030)
Urobilinogen, Ur: 0.2 mg/dL (ref 0.2–1.0)
pH, UA: 5.5 (ref 5.0–7.5)

## 2018-08-23 LAB — WET PREP FOR TRICH, YEAST, CLUE
Clue Cell Exam: NEGATIVE
Trichomonas Exam: NEGATIVE
Yeast Exam: NEGATIVE

## 2018-08-23 MED ORDER — METRONIDAZOLE 500 MG PO TABS: 500.0000 mg | ORAL_TABLET | Freq: Two times a day (BID) | ORAL | 0 refills | Status: DC

## 2018-08-23 NOTE — Addendum Note (Signed)
Addended by: Angela Adam on: 08/23/2018 04:22 PM   Modules accepted: Orders

## 2018-08-23 NOTE — Progress Notes (Signed)
BP 113/62   Pulse 81   Temp 97.8 F (36.6 C) (Oral)   Ht 5\' 8"  (1.727 m)   Wt 180 lb 6.4 oz (81.8 kg)   LMP 04/19/2018   BMI 27.43 kg/m    Subjective:   Patient ID: Tara Colon, female    DOB: 1991/02/24, 28 y.o.   MRN: [id number]  HPI: Tara Colon is a 28 y.o. female presenting on 08/23/2018 for Dysuria (x 1-2 weeks)   HPI Patient has been having vaginal discharge and irritation and dysuria this been going on for about a week.  She is going to her OB office and they did some swabs but she has not gotten any results so does not know if she has BV or yeast or urinary tract infection.  She has not taken any medication for any of these yet because she gets all 3 and does not know exactly which one to treat.  She is sexually active but only with 1 partner currently and is not concerned for any STDs.  She denies any abdominal pain but just has more pain and irritation at the urethral opening and vaginal opening.  She says she has some thicker discharge.  Relevant past medical, surgical, family and social history reviewed and updated as indicated. Interim medical history since our last visit reviewed. Allergies and medications reviewed and updated.  Review of Systems  Constitutional: Negative for chills and fever.  Eyes: Negative for visual disturbance.  Respiratory: Negative for chest tightness and shortness of breath.   Cardiovascular: Negative for chest pain and leg swelling.  Gastrointestinal: Negative for abdominal pain.  Genitourinary: Positive for dysuria, frequency, urgency, vaginal discharge and vaginal pain. Negative for difficulty urinating, hematuria and vaginal bleeding.  Musculoskeletal: Negative for back pain and gait problem.  Skin: Negative for rash.  Neurological: Negative for light-headedness and headaches.  Psychiatric/Behavioral: Negative for agitation and behavioral problems.  All other systems reviewed and are negative.   Per HPI unless specifically  indicated above   Allergies as of 08/23/2018   No Known Allergies     Medication List       Accurate as of August 23, 2018  3:54 PM. If you have any questions, ask your nurse or doctor.        STOP taking these medications   citalopram 20 MG tablet Commonly known as:  CELEXA Stopped by:  Nils PyleJoshua A Lajoyce Tamura, MD   Doxylamine-Pyridoxine 10-10 MG Tbec Commonly known as:  Diclegis Stopped by:  Elige RadonJoshua A Adalberto Metzgar, MD   ondansetron 4 MG tablet Commonly known as:  ZOFRAN Stopped by:  Elige RadonJoshua A Iyauna Sing, MD     TAKE these medications   Blood Pressure Monitor Misc For regular home bp monitoring during pregnancy   metroNIDAZOLE 0.75 % vaginal gel Commonly known as:  METROGEL VAGINAL Place 1 Applicatorful vaginally at bedtime. X 5 nights, no sex while using   prenatal vitamin w/FE, FA 27-1 MG Tabs tablet Take 1 tablet by mouth daily at 12 noon.        Objective:   BP 113/62   Pulse 81   Temp 97.8 F (36.6 C) (Oral)   Ht 5\' 8"  (1.727 m)   Wt 180 lb 6.4 oz (81.8 kg)   LMP 04/19/2018   BMI 27.43 kg/m   Wt Readings from Last 3 Encounters:  08/23/18 180 lb 6.4 oz (81.8 kg)  08/20/18 178 lb (80.7 kg)  07/16/18 173 lb (78.5 kg)    Physical Exam Vitals signs and  nursing note reviewed.  Constitutional:      General: She is not in acute distress.    Appearance: She is well-developed. She is not diaphoretic.  Eyes:     Conjunctiva/sclera: Conjunctivae normal.  Abdominal:     General: Bowel sounds are normal. There is no distension.     Palpations: Abdomen is soft. Abdomen is not rigid. There is no mass.     Tenderness: There is abdominal tenderness in the suprapubic area. There is no guarding or rebound.  Skin:    General: Skin is warm and dry.     Findings: No rash.  Neurological:     Mental Status: She is alert and oriented to person, place, and time.     Coordination: Coordination normal.  Psychiatric:        Behavior: Behavior normal.   Wet prep: Negative for  clue cells negative for trichomoniasis negative for yeast, many bacteria, will treat for BV  Urinalysis: 0-5 WBCs, 0-2 RBCs, 0-10 epithelial cells, few bacteria, 1+ protein, trace ketones Assessment & Plan:   Problem List Items Addressed This Visit    None    Visit Diagnoses    Dysuria    -  Primary   Relevant Orders   Urinalysis, Complete   Bacterial vaginosis       Relevant Medications   metroNIDAZOLE (FLAGYL) 500 MG tablet   Other Relevant Orders   WET PREP FOR TRICH, YEAST, CLUE       Follow up plan: Return if symptoms worsen or fail to improve.  Counseling provided for all of the vaccine components Orders Placed This Encounter  Procedures  . WET PREP FOR TRICH, YEAST, CLUE  . Urinalysis, Complete    Arville Care, MD [facility] 08/23/2018, 3:54 PM

## 2018-08-24 LAB — URINE CULTURE

## 2018-09-17 ENCOUNTER — Encounter: Payer: Medicaid Other | Admitting: Women's Health

## 2018-09-26 ENCOUNTER — Other Ambulatory Visit: Payer: Self-pay

## 2018-09-26 ENCOUNTER — Ambulatory Visit (INDEPENDENT_AMBULATORY_CARE_PROVIDER_SITE_OTHER): Payer: Medicaid Other | Admitting: Advanced Practice Midwife

## 2018-09-26 VITALS — BP 117/73 | HR 83

## 2018-09-26 DIAGNOSIS — L299 Pruritus, unspecified: Secondary | ICD-10-CM | POA: Diagnosis not present

## 2018-09-26 DIAGNOSIS — Z3482 Encounter for supervision of other normal pregnancy, second trimester: Secondary | ICD-10-CM

## 2018-09-26 DIAGNOSIS — O26892 Other specified pregnancy related conditions, second trimester: Secondary | ICD-10-CM

## 2018-09-26 DIAGNOSIS — Z3A24 24 weeks gestation of pregnancy: Secondary | ICD-10-CM | POA: Diagnosis not present

## 2018-09-26 NOTE — Progress Notes (Signed)
   TELEHEALTH VIRTUAL OBSTETRICS VISIT ENCOUNTER NOTE  I connected with Tara Colon on 09/26/18 at 10:45 AM EDT by telephone at home and verified that I am speaking with the correct person using two identifiers.   I discussed the limitations, risks, security and privacy concerns of performing an evaluation and management service by telephone and the availability of in person appointments. I also discussed with the patient that there may be a patient responsible charge related to this service. The patient expressed understanding and agreed to proceed.  Subjective:  Tara Colon is a 28 y.o. G2P1001 at [redacted]w[redacted]d being followed for ongoing prenatal care.  She is currently monitored for the following issues for this low-risk pregnancy and has Mood disorder in conditions classified elsewhere; PTSD (post-traumatic stress disorder); Supervision of normal pregnancy; HSV-2 seropositive; Smoker; Depression with anxiety; and Marijuana use on their problem list.  Patient reports itching on palms and soles, some on arms  No rash.. Reports fetal movement. Denies any contractions, bleeding or leaking of fluid.   The following portions of the patient's history were reviewed and updated as appropriate: allergies, current medications, past family history, past medical history, past social history, past surgical history and problem list.   Objective:   General:  Alert, oriented and cooperative.   Mental Status: Normal mood and affect perceived. Normal judgment and thought content.  Rest of physical exam deferred due to type of encounter  Assessment and Plan:  Pregnancy: G2P1001 at [redacted]w[redacted]d Pruritis (check bile acids when fasting tomorrow, orders in)  . Preterm labor symptoms and general obstetric precautions including but not limited to vaginal bleeding, contractions, leaking of fluid and fetal movement were reviewed in detail with the patient.  I discussed the assessment and treatment plan with the patient. The  patient was provided an opportunity to ask questions and all were answered. The patient agreed with the plan and demonstrated an understanding of the instructions. The patient was advised to call back or seek an in-person office evaluation/go to [facility] at [facility] for any urgent or concerning symptoms. Please refer to After Visit Summary for other counseling recommendations.   I provided 14 minutes of non-face-to-face time during this encounter.  No follow-ups on file.  No future appointments.  Christin Fudge, [place] for [employer], [place]

## 2018-09-26 NOTE — Patient Instructions (Signed)

## 2018-10-24 ENCOUNTER — Ambulatory Visit (INDEPENDENT_AMBULATORY_CARE_PROVIDER_SITE_OTHER): Payer: Medicaid Other | Admitting: Advanced Practice Midwife

## 2018-10-24 ENCOUNTER — Encounter: Payer: Self-pay | Admitting: Advanced Practice Midwife

## 2018-10-24 ENCOUNTER — Other Ambulatory Visit: Payer: Self-pay

## 2018-10-24 ENCOUNTER — Other Ambulatory Visit: Payer: Medicaid Other

## 2018-10-24 VITALS — BP 128/74 | HR 79 | Wt 191.0 lb

## 2018-10-24 DIAGNOSIS — R801 Persistent proteinuria, unspecified: Secondary | ICD-10-CM

## 2018-10-24 DIAGNOSIS — Z3A28 28 weeks gestation of pregnancy: Secondary | ICD-10-CM

## 2018-10-24 DIAGNOSIS — Z3480 Encounter for supervision of other normal pregnancy, unspecified trimester: Secondary | ICD-10-CM | POA: Diagnosis not present

## 2018-10-24 DIAGNOSIS — Z1389 Encounter for screening for other disorder: Secondary | ICD-10-CM

## 2018-10-24 DIAGNOSIS — Z23 Encounter for immunization: Secondary | ICD-10-CM

## 2018-10-24 DIAGNOSIS — Z331 Pregnant state, incidental: Secondary | ICD-10-CM

## 2018-10-24 DIAGNOSIS — Z3483 Encounter for supervision of other normal pregnancy, third trimester: Secondary | ICD-10-CM

## 2018-10-24 LAB — POCT URINALYSIS DIPSTICK OB
Blood, UA: NEGATIVE
Glucose, UA: NEGATIVE
Ketones, UA: NEGATIVE
Leukocytes, UA: NEGATIVE
Nitrite, UA: NEGATIVE

## 2018-10-24 NOTE — Progress Notes (Signed)
LOW-RISK PREGNANCY VISIT Patient name: Tara Colon MRN [id number]  Date of birth: May 06, 1990 Chief Complaint:   Routine Prenatal Visit (PN2, discharge)  History of Present Illness:   Tara Colon is a 28 y.o. G57P1001 female at [redacted]w[redacted]d with an Estimated Date of Delivery: 01/16/19 being seen today for ongoing management of a low-risk pregnancy.  Today she reports still itching on palms at night. Didn't come to get bile acids checked, but is fasting today for PN2. Hydrocortisone helps some. . Contractions: Not present. Vag. Bleeding: None.  Movement: Present. denies leaking of fluid.or UTI sx.  Review of Systems:   Pertinent items are noted in HPI Denies abnormal vaginal discharge w/ itching/odor/irritation, headaches, visual changes, shortness of breath, chest pain, abdominal pain, severe nausea/vomiting, or problems with urination or bowel movements unless otherwise stated above.  Pertinent History Reviewed:  Medical & Surgical Hx:   Past Medical History:  Diagnosis Date  . Anxiety   . Bipolar affect, depressed ([place])   . BV (bacterial vaginosis)   . Depression   . Insomnia    Past Surgical History:  Procedure Laterality Date  . NO PAST SURGERIES     Family History  Problem Relation Age of Onset  . Anxiety disorder Father   . Depression Father   . Bipolar disorder Father   . Diabetes Maternal Grandmother   . Heart failure Maternal Grandmother   . Drug abuse Paternal Aunt   . Alcohol abuse Paternal Aunt   . Drug abuse Paternal Uncle   . Alcohol abuse Paternal Uncle   . Alcohol abuse Cousin   . Drug abuse Cousin   . Asthma Brother   . Diabetes Maternal Uncle     Current Outpatient Medications:  .  Blood Pressure Monitor MISC, For regular home bp monitoring during pregnancy, Disp: 1 each, Rfl: 0 .  prenatal vitamin w/FE, FA (PRENATAL 1 + 1) 27-1 MG TABS tablet, Take 1 tablet by mouth daily at 12 noon., Disp: 30 each, Rfl: 12 Social History: Reviewed -  reports that she has  been smoking cigarettes. She has a 9.00 pack-year smoking history. She has never used smokeless tobacco.  Physical Assessment:   Vitals:   10/24/18 0917  BP: 128/74  Pulse: 79  Weight: 191 lb (86.6 kg)  Body mass index is 29.04 kg/m.        Physical Examination:   General appearance: Well appearing, and in no distress  Mental status: Alert, oriented to person, place, and time  Skin: Warm & dry  Cardiovascular: Normal heart rate noted  Respiratory: Normal respiratory effort, no distress  Abdomen: Soft, gravid, nontender  Pelvic: Cervical exam deferred         Extremities: Edema: None  Fetal Status: Fetal Heart Rate (bpm): 133 Fundal Height: 29 cm Movement: Present    Has had persistent proteinuria w/a neg urine culture.   Results for orders placed or performed in visit on 10/24/18 (from the past 24 hour(s))  POC Urinalysis Dipstick OB   Collection Time: 10/24/18  9:22 AM  Result Value Ref Range   Color, UA     Clarity, UA     Glucose, UA Negative Negative   Bilirubin, UA     Ketones, UA neg    Spec Grav, UA     Blood, UA neg    pH, UA     POC,PROTEIN,UA Moderate (2+) Negative, Trace, Small (1+), Moderate (2+), Large (3+), 4+   Urobilinogen, UA     Nitrite, UA neg  Leukocytes, UA Negative Negative   Appearance     Odor      Assessment & Plan:  1) Low-risk pregnancy G2P1001 at 4993w0d with an Estimated Date of Delivery: 01/16/19   2) Persistent proteinuria, Check 24 hour    Labs/procedures/US today: PN2, bile acids  Plan:  Continue routine obstetrical care    Follow-up: Return in about 3 weeks (around 11/14/2018) for OB Mychart visit.  Orders Placed This Encounter  Procedures  . Urine Culture  . Tdap vaccine greater than or equal to 7yo IM  . Protein, urine, 24 hour  . POC Urinalysis Dipstick OB   Jacklyn ShellFrances Cresenzo-Dishmon CNM 10/24/2018 9:44 AM

## 2018-10-24 NOTE — Patient Instructions (Signed)
Aurea Graff, I greatly value your feedback.  If you receive a survey following your visit with [place] today, we appreciate you taking the time to fill it out.  Thanks, Nigel Berthold, CNM   Call the office [phone]) or go to [facility] if:  You begin to have strong, frequent contractions  Your water breaks.  Sometimes it is a big gush of fluid, sometimes it is just a trickle that keeps getting your panties wet or running down your legs  You have vaginal bleeding.  It is normal to have a small amount of spotting if your cervix was checked.   You don't feel your baby moving like normal.  If you don't, get you something to eat and drink and lay down and focus on feeling your baby move.  You should feel at least 10 movements in 2 hours.  If you don't, you should call the office or go to [facility].    Tdap Vaccine  It is recommended that you get the Tdap vaccine during the third trimester of EACH pregnancy to help protect your baby from getting pertussis (whooping cough)  27-36 weeks is the BEST time to do this so that you can pass the protection on to your baby. During pregnancy is better than after pregnancy, but if you are unable to get it during pregnancy it will be offered at the hospital.   You will be offered this vaccine in the office after 27 weeks. If you do not have health insurance, you can get this vaccine at the health department or your family doctor  Everyone who will be around your baby should also be up-to-date on their vaccines. Adults (who are not pregnant) only need 1 dose of Tdap during adulthood.   Third Trimester of Pregnancy The third trimester is from week 29 through week 42, months 7 through 9. The third trimester is a time when the fetus is growing rapidly. At the end of the ninth month, the fetus is about 20 inches in length and weighs 6-10 pounds.  BODY CHANGES Your body goes through many changes during pregnancy. The changes vary from woman to  woman.   Your weight will continue to increase. You can expect to gain 25-35 pounds (11-16 kg) by the end of the pregnancy.  You may begin to get stretch marks on your hips, abdomen, and breasts.  You may urinate more often because the fetus is moving lower into your pelvis and pressing on your bladder.  You may develop or continue to have heartburn as a result of your pregnancy.  You may develop constipation because certain hormones are causing the muscles that push waste through your intestines to slow down.  You may develop hemorrhoids or swollen, bulging veins (varicose veins).  You may have pelvic pain because of the weight gain and pregnancy hormones relaxing your joints between the bones in your pelvis. Backaches may result from overexertion of the muscles supporting your posture.  You may have changes in your hair. These can include thickening of your hair, rapid growth, and changes in texture. Some women also have hair loss during or after pregnancy, or hair that feels dry or thin. Your hair will most likely return to normal after your baby is born.  Your breasts will continue to grow and be tender. A yellow discharge may leak from your breasts called colostrum.  Your belly button may stick out.  You may feel short of breath because of your expanding uterus.  You  may notice the fetus "dropping," or moving lower in your abdomen.  You may have a bloody mucus discharge. This usually occurs a few days to a week before labor begins.  Your cervix becomes thin and soft (effaced) near your due date. WHAT TO EXPECT AT YOUR PRENATAL EXAMS  You will have prenatal exams every 2 weeks until week 36. Then, you will have weekly prenatal exams. During a routine prenatal visit:  You will be weighed to make sure you and the fetus are growing normally.  Your blood pressure is taken.  Your abdomen will be measured to track your baby's growth.  The fetal heartbeat will be listened  to.  Any test results from the previous visit will be discussed.  You may have a cervical check near your due date to see if you have effaced. At around 36 weeks, your caregiver will check your cervix. At the same time, your caregiver will also perform a test on the secretions of the vaginal tissue. This test is to determine if a type of bacteria, Group B streptococcus, is present. Your caregiver will explain this further. Your caregiver may ask you:  What your birth plan is.  How you are feeling.  If you are feeling the baby move.  If you have had any abnormal symptoms, such as leaking fluid, bleeding, severe headaches, or abdominal cramping.  If you have any questions. Other tests or screenings that may be performed during your third trimester include:  Blood tests that check for low iron levels (anemia).  Fetal testing to check the health, activity level, and growth of the fetus. Testing is done if you have certain medical conditions or if there are problems during the pregnancy. FALSE LABOR You may feel small, irregular contractions that eventually go away. These are called Braxton Hicks contractions, or false labor. Contractions may last for hours, days, or even weeks before true labor sets in. If contractions come at regular intervals, intensify, or become painful, it is best to be seen by your caregiver.  SIGNS OF LABOR   Menstrual-like cramps.  Contractions that are 5 minutes apart or less.  Contractions that start on the top of the uterus and spread down to the lower abdomen and back.  A sense of increased pelvic pressure or back pain.  A watery or bloody mucus discharge that comes from the vagina. If you have any of these signs before the 37th week of pregnancy, call your caregiver right away. You need to go to the hospital to get checked immediately. HOME CARE INSTRUCTIONS   Avoid all smoking, herbs, alcohol, and unprescribed drugs. These chemicals affect the  formation and growth of the baby.  Follow your caregiver's instructions regarding medicine use. There are medicines that are either safe or unsafe to take during pregnancy.  Exercise only as directed by your caregiver. Experiencing uterine cramps is a good sign to stop exercising.  Continue to eat regular, healthy meals.  Wear a good support bra for breast tenderness.  Do not use hot tubs, steam rooms, or saunas.  Wear your seat belt at all times when driving.  Avoid raw meat, uncooked cheese, cat litter boxes, and soil used by cats. These carry germs that can cause birth defects in the baby.  Take your prenatal vitamins.  Try taking a stool softener (if your caregiver approves) if you develop constipation. Eat more high-fiber foods, such as fresh vegetables or fruit and whole grains. Drink plenty of fluids to keep your urine clear  or pale yellow.  Take warm sitz baths to soothe any pain or discomfort caused by hemorrhoids. Use hemorrhoid cream if your caregiver approves.  If you develop varicose veins, wear support hose. Elevate your feet for 15 minutes, 3-4 times a day. Limit salt in your diet.  Avoid heavy lifting, wear low heal shoes, and practice good posture.  Rest a lot with your legs elevated if you have leg cramps or low back pain.  Visit your dentist if you have not gone during your pregnancy. Use a soft toothbrush to brush your teeth and be gentle when you floss.  A sexual relationship may be continued unless your caregiver directs you otherwise.  Do not travel far distances unless it is absolutely necessary and only with the approval of your caregiver.  Take prenatal classes to understand, practice, and ask questions about the labor and delivery.  Make a trial run to the hospital.  Pack your hospital bag.  Prepare the baby's nursery.  Continue to go to all your prenatal visits as directed by your caregiver. SEEK MEDICAL CARE IF:  You are unsure if you are in  labor or if your water has broken.  You have dizziness.  You have mild pelvic cramps, pelvic pressure, or nagging pain in your abdominal area.  You have persistent nausea, vomiting, or diarrhea.  You have a bad smelling vaginal discharge.  You have pain with urination. SEEK IMMEDIATE MEDICAL CARE IF:   You have a fever.  You are leaking fluid from your vagina.  You have spotting or bleeding from your vagina.  You have severe abdominal cramping or pain.  You have rapid weight loss or gain.  You have shortness of breath with chest pain.  You notice sudden or extreme swelling of your face, hands, ankles, feet, or legs.  You have not felt your baby move in over an hour.  You have severe headaches that do not go away with medicine.  You have vision changes. Document Released: 02/28/2001 Document Revised: 03/11/2013 Document Reviewed: 05/07/2012 [facility] Patient Information 2015 [place], [place]. This information is not intended to replace advice given to you by your health care provider. Make sure you discuss any questions you have with your health care provider.

## 2018-10-25 LAB — GLUCOSE TOLERANCE, 2 HOURS W/ 1HR
Glucose, 1 hour: 103 mg/dL (ref 65–179)
Glucose, 2 hour: 102 mg/dL (ref 65–152)
Glucose, Fasting: 85 mg/dL (ref 65–91)

## 2018-10-25 LAB — CBC
Hematocrit: 33.1 % — ABNORMAL LOW (ref 34.0–46.6)
Hemoglobin: 11.5 g/dL (ref 11.1–15.9)
MCH: 31.3 pg (ref 26.6–33.0)
MCHC: 34.7 g/dL (ref 31.5–35.7)
MCV: 90 fL (ref 79–97)
Platelets: 317 10*3/uL (ref 150–450)
RBC: 3.68 x10E6/uL — ABNORMAL LOW (ref 3.77–5.28)
RDW: 11.9 % (ref 11.7–15.4)
WBC: 7.1 10*3/uL (ref 3.4–10.8)

## 2018-10-25 LAB — RPR: RPR Ser Ql: NONREACTIVE

## 2018-10-25 LAB — HIV ANTIBODY (ROUTINE TESTING W REFLEX): HIV Screen 4th Generation wRfx: NONREACTIVE

## 2018-10-25 LAB — ANTIBODY SCREEN: Antibody Screen: NEGATIVE

## 2018-10-26 LAB — URINE CULTURE

## 2018-10-29 ENCOUNTER — Other Ambulatory Visit: Payer: Self-pay | Admitting: Advanced Practice Midwife

## 2018-10-29 DIAGNOSIS — R8271 Bacteriuria: Secondary | ICD-10-CM

## 2018-10-29 MED ORDER — AMOXICILLIN 500 MG PO CAPS: 500.0000 mg | ORAL_CAPSULE | Freq: Three times a day (TID) | ORAL | 0 refills | Status: DC

## 2018-10-29 NOTE — Progress Notes (Signed)
amox for GBS in urine

## 2018-10-30 ENCOUNTER — Encounter: Payer: Self-pay | Admitting: Advanced Practice Midwife

## 2018-10-30 NOTE — Telephone Encounter (Signed)
Pt states she would like to speak with a nurse regarding personal questions about group B.

## 2018-11-02 ENCOUNTER — Encounter (HOSPITAL_COMMUNITY): Payer: Self-pay

## 2018-11-02 ENCOUNTER — Other Ambulatory Visit: Payer: Self-pay

## 2018-11-02 ENCOUNTER — Inpatient Hospital Stay (HOSPITAL_COMMUNITY)
Admission: AD | Admit: 2018-11-02 | Discharge: 2018-11-03 | Disposition: A | Discharge status: Home / Self Care | Payer: Medicaid Other | Attending: Obstetrics and Gynecology | Admitting: Obstetrics and Gynecology

## 2018-11-02 ENCOUNTER — Inpatient Hospital Stay (HOSPITAL_BASED_OUTPATIENT_CLINIC_OR_DEPARTMENT_OTHER): Payer: Medicaid Other

## 2018-11-02 DIAGNOSIS — O368130 Decreased fetal movements, third trimester, not applicable or unspecified: Secondary | ICD-10-CM

## 2018-11-02 DIAGNOSIS — O23593 Infection of other part of genital tract in pregnancy, third trimester: Secondary | ICD-10-CM | POA: Insufficient documentation

## 2018-11-02 DIAGNOSIS — R109 Unspecified abdominal pain: Secondary | ICD-10-CM | POA: Diagnosis present

## 2018-11-02 DIAGNOSIS — Z880 Allergy status to penicillin: Secondary | ICD-10-CM | POA: Diagnosis not present

## 2018-11-02 DIAGNOSIS — O98813 Other maternal infectious and parasitic diseases complicating pregnancy, third trimester: Secondary | ICD-10-CM | POA: Diagnosis not present

## 2018-11-02 DIAGNOSIS — O2343 Unspecified infection of urinary tract in pregnancy, third trimester: Secondary | ICD-10-CM | POA: Diagnosis not present

## 2018-11-02 DIAGNOSIS — B379 Candidiasis, unspecified: Secondary | ICD-10-CM | POA: Diagnosis not present

## 2018-11-02 DIAGNOSIS — B373 Candidiasis of vulva and vagina: Secondary | ICD-10-CM | POA: Diagnosis not present

## 2018-11-02 DIAGNOSIS — O368120 Decreased fetal movements, second trimester, not applicable or unspecified: Secondary | ICD-10-CM

## 2018-11-02 DIAGNOSIS — Z3689 Encounter for other specified antenatal screening: Secondary | ICD-10-CM | POA: Diagnosis not present

## 2018-11-02 DIAGNOSIS — F17210 Nicotine dependence, cigarettes, uncomplicated: Secondary | ICD-10-CM | POA: Insufficient documentation

## 2018-11-02 DIAGNOSIS — O2340 Unspecified infection of urinary tract in pregnancy, unspecified trimester: Secondary | ICD-10-CM

## 2018-11-02 DIAGNOSIS — Z3A29 29 weeks gestation of pregnancy: Secondary | ICD-10-CM | POA: Insufficient documentation

## 2018-11-02 DIAGNOSIS — R1030 Lower abdominal pain, unspecified: Secondary | ICD-10-CM

## 2018-11-02 DIAGNOSIS — O99333 Smoking (tobacco) complicating pregnancy, third trimester: Secondary | ICD-10-CM | POA: Diagnosis not present

## 2018-11-02 DIAGNOSIS — B9689 Other specified bacterial agents as the cause of diseases classified elsewhere: Secondary | ICD-10-CM | POA: Diagnosis not present

## 2018-11-02 DIAGNOSIS — O368190 Decreased fetal movements, unspecified trimester, not applicable or unspecified: Secondary | ICD-10-CM

## 2018-11-02 LAB — WET PREP, GENITAL
Sperm: NONE SEEN
Trich, Wet Prep: NONE SEEN

## 2018-11-02 LAB — URINALYSIS, ROUTINE W REFLEX MICROSCOPIC
Bilirubin Urine: NEGATIVE
Glucose, UA: NEGATIVE mg/dL
Hgb urine dipstick: NEGATIVE
Ketones, ur: NEGATIVE mg/dL
Leukocytes,Ua: NEGATIVE
Nitrite: NEGATIVE
Protein, ur: 30 mg/dL — AB
Specific Gravity, Urine: 1.014 (ref 1.005–1.030)
pH: 6 (ref 5.0–8.0)

## 2018-11-02 MED ORDER — CYCLOBENZAPRINE HCL 10 MG PO TABS
10.0000 mg | ORAL_TABLET | Freq: Once | ORAL | Status: AC
  Administered 2018-11-02: 10 mg via ORAL
  Filled 2018-11-02: qty 1

## 2018-11-02 MED ORDER — TERCONAZOLE 0.4 % VA CREA: 1.0000 | TOPICAL_CREAM | Freq: Every day | VAGINAL | 0 refills | Status: AC

## 2018-11-02 MED ORDER — CEPHALEXIN 500 MG PO CAPS: 500.0000 mg | ORAL_CAPSULE | Freq: Four times a day (QID) | ORAL | 0 refills | Status: AC

## 2018-11-02 MED ORDER — METRONIDAZOLE 500 MG PO TABS: 500.0000 mg | ORAL_TABLET | Freq: Two times a day (BID) | ORAL | 0 refills | Status: AC

## 2018-11-02 NOTE — MAU Provider Note (Signed)
History     CSN: [id number]  Arrival date and time: 11/02/18 2047   First Provider Initiated Contact with Patient 11/02/18 2148      Chief Complaint  Patient presents with  . Abdominal Pain  . Decreased Fetal Movement   Ms. Tara Colon is a 28 y.o. G2P1001 at 7639w2d who presents to MAU for "burning in my stomach." Pt reports she was recently given amoxicillin for a UTI and states she "feels crazy and off" since she started taking it. Pt attempted to describe some sort of "bumps" on her arm the day she started the medication, but denies that they were hives. Pt denies presence of abnormalities on skin today.  Onset: 10/31/2018 Location: stomach, lower quadrants Duration: ~2days Character: burning, constant, "I don't feel myself" Aggravating/Associated: none/DFM since starting the medication Relieving: none Treatment: none Severity: 7/10  Pt denies VB, LOF, ctx, vaginal discharge/odor/itching. Pt denies N/V, constipation, diarrhea, or urinary problems. Pt denies fever, chills, fatigue, sweating or changes in appetite. Pt denies SOB or chest pain. Pt denies dizziness, HA, light-headedness, weakness.  Problems this pregnancy include: smoking, HSV, +THC, GBS in urine, anxiety/depression. Allergies? NKDA Current medications/supplements? Amoxicillin (last took 1030 today), PNVs Prenatal care provider? [facility], next appt 11/14/2018   OB History    Gravida  2   Para  1   Term  1   Preterm      AB      Living  1     SAB      TAB      Ectopic      Multiple      Live Births  1           Past Medical History:  Diagnosis Date  . Anxiety   . Bipolar affect, depressed (HCC)   . BV (bacterial vaginosis)   . Depression   . Insomnia     Past Surgical History:  Procedure Laterality Date  . NO PAST SURGERIES      Family History  Problem Relation Age of Onset  . Anxiety disorder Father   . Depression Father   . Bipolar disorder Father   . Diabetes  Maternal Grandmother   . Heart failure Maternal Grandmother   . Drug abuse Paternal Aunt   . Alcohol abuse Paternal Aunt   . Drug abuse Paternal Uncle   . Alcohol abuse Paternal Uncle   . Alcohol abuse Cousin   . Drug abuse Cousin   . Asthma Brother   . Diabetes Maternal Uncle     Social History   Tobacco Use  . Smoking status: Current Every Day Smoker    Packs/day: 1.00    Years: 9.00    Pack years: 9.00    Types: Cigarettes  . Smokeless tobacco: Never Used  Substance Use Topics  . Alcohol use: Not Currently    Alcohol/week: 14.0 standard drinks    Types: 14 Cans of beer per week    Comment: 24 oz of beer per day; before pregnancy  . Drug use: Not Currently    Types: Marijuana    Allergies: No Known Allergies  Medications Prior to Admission  Medication Sig Dispense Refill Last Dose  . amoxicillin (AMOXIL) 500 MG capsule Take 1 capsule (500 mg total) by mouth 3 (three) times daily. 21 capsule 0 11/02/2018 at Unknown time  . prenatal vitamin w/FE, FA (PRENATAL 1 + 1) 27-1 MG TABS tablet Take 1 tablet by mouth daily at 12 noon. 30 each 12 11/02/2018  at Unknown time  . Blood Pressure Monitor MISC For regular home bp monitoring during pregnancy 1 each 0     Review of Systems  Constitutional: Negative for chills, diaphoresis, fatigue and fever.  Respiratory: Negative for shortness of breath.   Cardiovascular: Negative for chest pain.  Gastrointestinal: Positive for abdominal pain. Negative for constipation, diarrhea, nausea and vomiting.  Genitourinary: Negative for dysuria, flank pain, frequency, pelvic pain, urgency, vaginal bleeding and vaginal discharge.  Neurological: Negative for dizziness, weakness, light-headedness and headaches.   Physical Exam   Blood pressure 120/62, pulse 95, temperature 99.3 F (37.4 C), resp. rate 16, height 5\' 8"  (1.727 m), weight 87.6 kg, last menstrual period 04/19/2018, SpO2 100 %.  Patient Vitals for the past 24 hrs:  BP Temp Pulse  Resp SpO2 Height Weight  11/02/18 2103 120/62 99.3 F (37.4 C) 95 16 100 % - -  11/02/18 2056 - - - - - 5\' 8"  (1.727 m) 87.6 kg   Physical Exam  Constitutional: She is oriented to person, place, and time. She appears well-developed and well-nourished. No distress.  HENT:  Head: Normocephalic and atraumatic.  Respiratory: Effort normal.  GI: Soft. She exhibits no distension and no mass. There is no abdominal tenderness. There is no rebound and no guarding.  Genitourinary: There is no rash, tenderness or lesion on the right labia. There is no rash, tenderness or lesion on the left labia. Uterus is enlarged. Uterus is not tender. Cervix exhibits no motion tenderness, no discharge and no friability.    Vaginal discharge (thick, white clumped) present.     No vaginal tenderness or bleeding.  No tenderness or bleeding in the vagina.    Genitourinary Comments: CE: long/closed/posterior   Neurological: She is alert and oriented to person, place, and time.  Skin: Skin is warm and dry. She is not diaphoretic.  Psychiatric: She has a normal mood and affect. Her behavior is normal. Judgment and thought content normal.   Results for orders placed or performed during the hospital encounter of 11/02/18 (from the past 24 hour(s))  Urinalysis, Routine w reflex microscopic     Status: Abnormal   Collection Time: 11/02/18  9:14 PM  Result Value Ref Range   Color, Urine YELLOW YELLOW   APPearance HAZY (A) CLEAR   Specific Gravity, Urine 1.014 1.005 - 1.030   pH 6.0 5.0 - 8.0   Glucose, UA NEGATIVE NEGATIVE mg/dL   Hgb urine dipstick NEGATIVE NEGATIVE   Bilirubin Urine NEGATIVE NEGATIVE   Ketones, ur NEGATIVE NEGATIVE mg/dL   Protein, ur 30 (A) NEGATIVE mg/dL   Nitrite NEGATIVE NEGATIVE   Leukocytes,Ua NEGATIVE NEGATIVE   RBC / HPF 0-5 0 - 5 RBC/hpf   WBC, UA 0-5 0 - 5 WBC/hpf   Bacteria, UA RARE (A) NONE SEEN   Squamous Epithelial / LPF 11-20 0 - 5   Mucus PRESENT   Wet prep, genital     Status:  Abnormal   Collection Time: 11/02/18 10:09 PM   Specimen: Cervical/Vaginal swab  Result Value Ref Range   Yeast Wet Prep HPF POC PRESENT (A) NONE SEEN   Trich, Wet Prep NONE SEEN NONE SEEN   Clue Cells Wet Prep HPF POC PRESENT (A) NONE SEEN   WBC, Wet Prep HPF POC MODERATE (A) NONE SEEN   Sperm NONE SEEN    No results found.  MAU Course  Procedures  MDM -burning in lower abdominal quadrants -started x2days with initiation of AMOX, ABX switched to Keflex for  GBS UTI -yeast appearing discharge on exam, terconazole RX sent -UA: hazy/30PRO/rare bacteria, sending urine for repeat culture, pt on ABX for known GBS UTI -WetPrep: +yeast, +ClueCells (will treat for BV), mod WBCs -GC/CT collected -Flexeril given after BPP, pt reports burning sensation is now 3/10  -DFM -EFM: reactive with variables       -baseline: 140/130       -variability: moderate       -accels: present, 10x10       -decels: few variable       -TOCO: irritability, few ctx irregular -ice juice given, pt reports FM is present, but denies return to normal movement -BPP: 8/8 -discussed FKCs  -pt discharged to home in stable condition  Orders Placed This Encounter  Procedures  . Wet prep, genital    Standing Status:   Standing    Number of Occurrences:   1  . Culture, OB Urine    Standing Status:   Standing    Number of Occurrences:   1  . [place] MFM FETAL BPP WO NON STRESS    Standing Status:   Standing    Number of Occurrences:   1    Order Specific Question:   Symptom/Reason for Exam    Answer:   Decreased fetal movement [174081]  . Urinalysis, Routine w reflex microscopic    Standing Status:   Standing    Number of Occurrences:   1  . Discharge patient    Order Specific Question:   Discharge disposition    Answer:   01-Home or Self Care [1]    Order Specific Question:   Discharge patient date    Answer:   11/03/2018   Meds ordered this encounter  Medications  . cephALEXin (KEFLEX) 500 MG capsule     Sig: Take 1 capsule (500 mg total) by mouth 4 (four) times daily for 7 days.    Dispense:  28 capsule    Refill:  0    Order Specific Question:   Supervising Provider    Answer:   CONSTANT, PEGGY [[id number]]  . terconazole (TERAZOL 7) 0.4 % vaginal cream    Sig: Place 1 applicator vaginally at bedtime for 7 days.    Dispense:  45 g    Refill:  0    Order Specific Question:   Supervising Provider    Answer:   CONSTANT, PEGGY [[id number]]  . cyclobenzaprine (FLEXERIL) tablet 10 mg  . metroNIDAZOLE (FLAGYL) 500 MG tablet    Sig: Take 1 tablet (500 mg total) by mouth 2 (two) times daily for 7 days.    Dispense:  14 tablet    Refill:  0    Order Specific Question:   Supervising Provider    Answer:   CONSTANT, PEGGY [[id number]]  . cyclobenzaprine (FLEXERIL) 10 MG tablet    Sig: Take 1 tablet (10 mg total) by mouth 3 (three) times daily as needed for muscle spasms.    Dispense:  30 tablet    Refill:  0    Order Specific Question:   Supervising Provider    Answer:   CONSTANT, PEGGY [[id number]]   Assessment and Plan   1. Lower abdominal pain   2. Decreased fetal movement   3. Urinary tract infection affecting pregnancy   4. Yeast infection   5. Bacterial vaginosis   6. NST (non-stress test) reactive   7. [redacted] weeks gestation of pregnancy    Allergies as of 11/03/2018   No Known Allergies  Medication List    STOP taking these medications   amoxicillin 500 MG capsule Commonly known as: AMOXIL     TAKE these medications   Blood Pressure Monitor Misc For regular home bp monitoring during pregnancy   cephALEXin 500 MG capsule Commonly known as: KEFLEX Take 1 capsule (500 mg total) by mouth 4 (four) times daily for 7 days.   cyclobenzaprine 10 MG tablet Commonly known as: FLEXERIL Take 1 tablet (10 mg total) by mouth 3 (three) times daily as needed for muscle spasms.   metroNIDAZOLE 500 MG tablet Commonly known as: Flagyl Take 1 tablet (500 mg total) by mouth 2 (two) times daily for 7 days.    prenatal vitamin w/FE, FA 27-1 MG Tabs tablet Take 1 tablet by mouth daily at 12 noon.   terconazole 0.4 % vaginal cream Commonly known as: TERAZOL 7 Place 1 applicator vaginally at bedtime for 7 days.      -will call with culture results, if positive -RX Keflex -RX terconazole -RX metronidazole -RX flexeril -discussed burning sensation may be related to UTI or vaginal infections -discussed relationship between smoking and fetal movement -discussed normal fetal movement across GA -discussed FKCs -strict DFM/pain/bleeding/return MAU precautions given -pt discharged to home in stable condition  Joni Reiningicole E Nugent 11/03/2018, 12:55 AM

## 2018-11-02 NOTE — MAU Note (Signed)
Pt reports she was prescribed medication on Thursday for UTI and it makes her have a burning sensation in her gut. Reports that the baby is not moving as much since she started the medication.

## 2018-11-02 NOTE — ED Notes (Signed)
Transport called to take pt to MAU. 

## 2018-11-02 NOTE — MAU Note (Signed)
Pt reports feeling the baby move after drinking sprite but movement is still not like usual.

## 2018-11-03 MED ORDER — CYCLOBENZAPRINE HCL 10 MG PO TABS: 10.0000 mg | ORAL_TABLET | Freq: Three times a day (TID) | ORAL | 0 refills | Status: DC | PRN

## 2018-11-03 NOTE — Discharge Instructions (Signed)
Abdominal Pain During Pregnancy  Abdominal pain is common during pregnancy, and has many possible causes. Some causes are more serious than others, and sometimes the cause is not known. Abdominal pain can be a sign that labor is starting. It can also be caused by normal growth and stretching of muscles and ligaments during pregnancy. Always tell your health care provider if you have any abdominal pain. Follow these instructions at home:  Do not have sex or put anything in your vagina until your pain goes away completely.  Get plenty of rest until your pain improves.  Drink enough fluid to keep your urine pale yellow.  Take over-the-counter and prescription medicines only as told by your health care provider.  Keep all follow-up visits as told by your health care provider. This is important. Contact a health care provider if:  Your pain continues or gets worse after resting.  You have lower abdominal pain that: ? Comes and goes at regular intervals. ? Spreads to your back. ? Is similar to menstrual cramps.  You have pain or burning when you urinate. Get help right away if:  You have a fever or chills.  You have vaginal bleeding.  You are leaking fluid from your vagina.  You are passing tissue from your vagina.  You have vomiting or diarrhea that lasts for more than 24 hours.  Your baby is moving less than usual.  You feel very weak or faint.  You have shortness of breath.  You develop severe pain in your upper abdomen. Summary  Abdominal pain is common during pregnancy, and has many possible causes.  If you experience abdominal pain during pregnancy, tell your health care provider right away.  Follow your health care provider's home care instructions and keep all follow-up visits as directed. This information is not intended to replace advice given to you by your health care provider. Make sure you discuss any questions you have with your health care  provider. Document Released: 03/06/2005 Document Revised: 06/24/2018 Document Reviewed: 06/08/2016 Elsevier Patient Education  2020 [place].  Fetal Movement Counts Patient Name: ________________________________________________ Patient Due Date: ____________________ What is a fetal movement count?  A fetal movement count is the number of times that you feel your baby move during a certain amount of time. This may also be called a fetal kick count. A fetal movement count is recommended for every pregnant woman. You may be asked to start counting fetal movements as early as week 28 of your pregnancy. Pay attention to when your baby is most active. You may notice your baby's sleep and wake cycles. You may also notice things that make your baby move more. You should do a fetal movement count:  When your baby is normally most active.  At the same time each day. A good time to count movements is while you are resting, after having something to eat and drink. How do I count fetal movements? 1. Find a quiet, comfortable area. Sit, or lie down on your side. 2. Write down the date, the start time and stop time, and the number of movements that you felt between those two times. Take this information with you to your health care visits. 3. For 2 hours, count kicks, flutters, swishes, rolls, and jabs. You should feel at least 10 movements during 2 hours. 4. You may stop counting after you have felt 10 movements. 5. If you do not feel 10 movements in 2 hours, have something to eat and drink. Then, keep  resting and counting for 1 hour. If you feel at least 4 movements during that hour, you may stop counting. Contact a health care provider if:  You feel fewer than 4 movements in 2 hours.  Your baby is not moving like he or she usually does. Date: ____________ Start time: ____________ Stop time: ____________ Movements: ____________ Date: ____________ Start time: ____________ Stop time: ____________  Movements: ____________ Date: ____________ Start time: ____________ Stop time: ____________ Movements: ____________ Date: ____________ Start time: ____________ Stop time: ____________ Movements: ____________ Date: ____________ Start time: ____________ Stop time: ____________ Movements: ____________ Date: ____________ Start time: ____________ Stop time: ____________ Movements: ____________ Date: ____________ Start time: ____________ Stop time: ____________ Movements: ____________ Date: ____________ Start time: ____________ Stop time: ____________ Movements: ____________ Date: ____________ Start time: ____________ Stop time: ____________ Movements: ____________ This information is not intended to replace advice given to you by your health care provider. Make sure you discuss any questions you have with your health care provider. Document Released: 04/05/2006 Document Revised: 03/26/2018 Document Reviewed: 04/15/2015 Elsevier Patient Education  2020 [employer] Inc.  Pregnancy and Urinary Tract Infection  A urinary tract infection (UTI) is an infection of any part of the urinary tract. This includes the kidneys, the tubes that connect your kidneys to your bladder (ureters), the bladder, and the tube that carries urine out of your body (urethra). These organs make, store, and get rid of urine in the body. Your health care provider may use other names to describe the infection. An upper UTI affects the ureters and kidneys (pyelonephritis). A lower UTI affects the bladder (cystitis) and urethra (urethritis). Most urinary tract infections are caused by bacteria in your genital area, around the entrance to your urinary tract (urethra). These bacteria grow and cause irritation and inflammation of your urinary tract. You are more likely to develop a UTI during pregnancy because the physical and hormonal changes your body goes through can make it easier for bacteria to get into your urinary tract. Your growing  baby also puts pressure on your bladder and can affect urine flow. It is important to recognize and treat UTIs in pregnancy because of the risk of serious complications for both you and your baby. How does this affect me? Symptoms of a UTI include:  Needing to urinate right away (urgently).  Frequent urination or passing small amounts of urine frequently.  Pain or burning with urination.  Blood in the urine.  Urine that smells bad or unusual.  Trouble urinating.  Cloudy urine.  Pain in the abdomen or lower back.  Vaginal discharge. You may also have:  Vomiting or a decreased appetite.  Confusion.  Irritability or tiredness.  A fever.  Diarrhea. How does this affect my baby? An untreated UTI during pregnancy could lead to a kidney infection or a systemic infection, which can cause health problems that could affect your baby. Possible complications of an untreated UTI include:  Giving birth to your baby before 37 weeks of pregnancy (premature).  Having a baby with a low birth weight.  Developing high blood pressure during pregnancy (preeclampsia).  Having a low hemoglobin level (anemia). What can I do to lower my risk? To prevent a UTI:  Go to the bathroom as soon as you feel the need. Do not hold urine for long periods of time.  Always wipe from front to back, especially after a bowel movement. Use each tissue one time when you wipe.  Empty your bladder after sex.  Keep your genital area dry.  Drink 6-10 glasses of water each day.  Do not douche or use deodorant sprays. How is this treated? Treatment for this condition may include:  Antibiotic medicines that are safe to take during pregnancy.  Other medicines to treat less common causes of UTI. Follow these instructions at home:  If you were prescribed an antibiotic medicine, take it as told by your health care provider. Do not stop using the antibiotic even if you start to feel better.  Keep all  follow-up visits as told by your health care provider. This is important. Contact a health care provider if:  Your symptoms do not improve or they get worse.  You have abnormal vaginal discharge. Get help right away if you:  Have a fever.  Have nausea and vomiting.  Have back or side pain.  Feel contractions in your uterus.  Have lower belly pain.  Have a gush of fluid from your vagina.  Have blood in your urine. Summary  A urinary tract infection (UTI) is an infection of any part of the urinary tract, which includes the kidneys, ureters, bladder, and urethra.  Most urinary tract infections are caused by bacteria in your genital area, around the entrance to your urinary tract (urethra).  You are more likely to develop a UTI during pregnancy.  If you were prescribed an antibiotic medicine, take it as told by your health care provider. Do not stop using the antibiotic even if you start to feel better. This information is not intended to replace advice given to you by your health care provider. Make sure you discuss any questions you have with your health care provider. Document Released: 07/01/2010 Document Revised: 06/28/2018 Document Reviewed: 02/07/2018 Elsevier Patient Education  2020 [employer]

## 2018-11-03 NOTE — Progress Notes (Signed)
Nicole Nugent NP in earlier to discuss test results and d/c plan. Written and verbal d/c instructions given and understanding voiced 

## 2018-11-04 LAB — CULTURE, OB URINE: Culture: <10000 — AB

## 2018-11-05 LAB — GC/CHLAMYDIA PROBE AMP (~~LOC~~) NOT AT ARMC
Chlamydia: NEGATIVE
Neisseria Gonorrhea: NEGATIVE

## 2018-11-14 ENCOUNTER — Telehealth: Payer: Medicaid Other | Admitting: Advanced Practice Midwife

## 2018-11-14 ENCOUNTER — Encounter: Payer: Self-pay | Admitting: Advanced Practice Midwife

## 2018-11-14 ENCOUNTER — Other Ambulatory Visit: Payer: Self-pay

## 2018-11-15 ENCOUNTER — Telehealth (INDEPENDENT_AMBULATORY_CARE_PROVIDER_SITE_OTHER): Payer: Medicaid Other | Admitting: Advanced Practice Midwife

## 2018-11-15 ENCOUNTER — Encounter: Payer: Self-pay | Admitting: Advanced Practice Midwife

## 2018-11-15 DIAGNOSIS — Z3483 Encounter for supervision of other normal pregnancy, third trimester: Secondary | ICD-10-CM | POA: Diagnosis not present

## 2018-11-15 DIAGNOSIS — Z3A31 31 weeks gestation of pregnancy: Secondary | ICD-10-CM

## 2018-11-15 DIAGNOSIS — R8271 Bacteriuria: Secondary | ICD-10-CM

## 2018-11-15 NOTE — Progress Notes (Signed)
   TELEHEALTH VIRTUAL OBSTETRICS VISIT ENCOUNTER NOTE  I connected with Tara Colon on 11/15/18 at  1:00 PM EDT by telephone at home and verified that I am speaking with the correct person using two identifiers.   I discussed the limitations, risks, security and privacy concerns of performing an evaluation and management service by telephone and the availability of in person appointments. I also discussed with the patient that there may be a patient responsible charge related to this service. The patient expressed understanding and agreed to proceed.  Subjective:  Tara Colon is a 28 y.o. G2P1001 at [redacted]w[redacted]d being followed for ongoing prenatal care.  She is currently monitored for the following issues for this low-risk pregnancy and has Mood disorder in conditions classified elsewhere; PTSD (post-traumatic stress disorder); Supervision of normal pregnancy; HSV-2 seropositive; Smoker; Depression with anxiety; Marijuana use; and GBS bacteriuria on their problem list.  Patient reports still itching on feet and palms, esp at night. Bile acids not run w/PN2???. Still hasn't done 24 hour urine, plans to do it Sunday>Monday. Has a lot going on at home. Lots of pressure in pelvis, maternity belt recommended.  [employer] Reports fetal movement. Denies any contractions, bleeding or leaking of fluid.   The following portions of the patient's history were reviewed and updated as appropriate: allergies, current medications, past family history, past medical history, past social history, past surgical history and problem list.   Objective:   General:  Alert, oriented and cooperative.   Mental Status: Normal mood and affect perceived. Normal judgment and thought content.  Rest of physical exam deferred due to type of encounter  Assessment and Plan:  Pregnancy: G2P1001 at [redacted]w[redacted]d 1. GBS bacteriuria   2. Encounter for supervision of other normal pregnancy in third trimester Itching. >check bile  acids on Monday (fasting).   Proteinuria>check 24 hour urine  Preterm labor symptoms and general obstetric precautions including but not limited to vaginal bleeding, contractions, leaking of fluid and fetal movement were reviewed in detail with the patient.  I discussed the assessment and treatment plan with the patient. The patient was provided an opportunity to ask questions and all were answered. The patient agreed with the plan and demonstrated an understanding of the instructions. The patient was advised to call back or seek an in-person office evaluation/go to MAU at [facility] for any urgent or concerning symptoms. Please refer to After Visit Summary for other counseling recommendations.   I provided 12 minutes of non-face-to-face time during this encounter.  Return in about 2 weeks (around 11/29/2018) for [place].  Future Appointments  Date Time Provider [place]  11/29/2018 10:50 AM Jonnie Kind, MD [facility]    Christin Fudge, [place] for [employer], [place]

## 2018-11-21 NOTE — Progress Notes (Signed)
error 

## 2018-11-29 ENCOUNTER — Ambulatory Visit (INDEPENDENT_AMBULATORY_CARE_PROVIDER_SITE_OTHER): Payer: Medicaid Other | Admitting: Obstetrics and Gynecology

## 2018-11-29 ENCOUNTER — Other Ambulatory Visit: Payer: Self-pay

## 2018-11-29 ENCOUNTER — Encounter: Payer: Self-pay | Admitting: Obstetrics and Gynecology

## 2018-11-29 VITALS — BP 125/69 | HR 93 | Wt 196.4 lb

## 2018-11-29 DIAGNOSIS — O1213 Gestational proteinuria, third trimester: Secondary | ICD-10-CM

## 2018-11-29 DIAGNOSIS — Z331 Pregnant state, incidental: Secondary | ICD-10-CM

## 2018-11-29 DIAGNOSIS — Z3483 Encounter for supervision of other normal pregnancy, third trimester: Secondary | ICD-10-CM

## 2018-11-29 DIAGNOSIS — Z3A33 33 weeks gestation of pregnancy: Secondary | ICD-10-CM

## 2018-11-29 DIAGNOSIS — Z1389 Encounter for screening for other disorder: Secondary | ICD-10-CM

## 2018-11-29 LAB — POCT URINALYSIS DIPSTICK OB
Glucose, UA: NEGATIVE
Ketones, UA: NEGATIVE
Nitrite, UA: NEGATIVE

## 2018-11-29 NOTE — Progress Notes (Signed)
Patient ID: Tara Colon, female   DOB: 1991-01-30, 28 y.o.   MRN: [id number]    LOW-RISK PREGNANCY VISIT Patient name: Tara Colon MRN [id number]  Date of birth: 09-20-1990 Chief Complaint:   Routine Prenatal Visit  History of Present Illness:   Tara Colon is a 28 y.o. G43P1001 female at [redacted]w[redacted]d with an Estimated Date of Delivery: 01/16/19 being seen today for ongoing management of a low-risk pregnancy. Denies any urinary symptoms. Denies headache and blurry vision. Today she reports no complaints. Contractions: Not present. Vag. Bleeding: None.  Movement: Present. denies leaking of fluid. Review of Systems:   Pertinent items are noted in HPI denies headache scotoma vision changes Denies abnormal vaginal discharge w/ itching/odor/irritation, headaches, visual changes, shortness of breath, chest pain, abdominal pain, severe nausea/vomiting, or problems with urination or bowel movements unless otherwise stated above. Pertinent History Reviewed:  Reviewed past medical,surgical, social, obstetrical and family history.  Reviewed problem list, medications and allergies. Physical Assessment:   Vitals:   11/29/18 1104  BP: 125/69  Pulse: 93  Weight: 196 lb 6.4 oz (89.1 kg)  Body mass index is 29.86 kg/m.        Physical Examination:   General appearance: Well appearing, and in no distress  Mental status: Alert, oriented to person, place, and time  Skin: Warm & dry  Cardiovascular: Normal heart rate noted  Respiratory: Normal respiratory effort, no distress  Abdomen: Soft, gravid, nontender  Pelvic: Cervical exam deferred         Extremities: Edema: None  Fetal Status:     Movement: Present    Results for orders placed or performed in visit on 11/29/18 (from the past 24 hour(s))  POC Urinalysis Dipstick OB   Collection Time: 11/29/18 11:05 AM  Result Value Ref Range   Color, UA     Clarity, UA     Glucose, UA Negative Negative   Bilirubin, UA     Ketones, UA neg    Spec Grav,  UA     Blood, UA trace    pH, UA     POC,PROTEIN,UA Large (3+) Negative, Trace, Small (1+), Moderate (2+), Large (3+), 4+   Urobilinogen, UA     Nitrite, UA neg    Leukocytes, UA Trace (A) Negative   Appearance     Odor      Assessment & Plan:  1) Low-risk pregnancy G2P1001 at [redacted]w[redacted]d with an Estimated Date of Delivery: 01/16/19   2) Possible UTI, phone call results 1 week   Meds: No orders of the defined types were placed in this encounter.  Labs/procedures today: C&S, protein and creatinine ratio  Plan:  Continue routine obstetrical care  Phone call results F/u 2 weeks  Reviewed: Preterm labor symptoms and general obstetric precautions including but not limited to vaginal bleeding, contractions, leaking of fluid and fetal movement were reviewed in detail with the patient.  All questions were answered.   Follow-up: No follow-ups on file.  Orders Placed This Encounter  Procedures  . POC Urinalysis Dipstick OB   By signing my name below, I, Samul Dada, attest that this documentation has been prepared under the direction and in the presence of Jonnie Kind, MD. Electronically Signed: [place]. 11/29/18. 11:15 AM.  I personally performed the services described in this documentation, which was SCRIBED in my presence. The recorded information has been reviewed and considered accurate. It has been edited as necessary during review. Jonnie Kind, MD

## 2018-11-30 DIAGNOSIS — R300 Dysuria: Secondary | ICD-10-CM | POA: Diagnosis not present

## 2018-11-30 DIAGNOSIS — H6691 Otitis media, unspecified, right ear: Secondary | ICD-10-CM | POA: Diagnosis not present

## 2018-11-30 LAB — PROTEIN / CREATININE RATIO, URINE
Creatinine, Urine: 281.2 mg/dL
Protein, Ur: 184.5 mg/dL
Protein/Creat Ratio: 656 mg/g creat — ABNORMAL HIGH (ref 0–200)

## 2018-12-01 LAB — URINE CULTURE

## 2018-12-03 DIAGNOSIS — O1213 Gestational proteinuria, third trimester: Secondary | ICD-10-CM

## 2018-12-06 ENCOUNTER — Other Ambulatory Visit: Payer: Self-pay

## 2018-12-06 ENCOUNTER — Other Ambulatory Visit: Payer: Medicaid Other

## 2018-12-06 ENCOUNTER — Ambulatory Visit (INDEPENDENT_AMBULATORY_CARE_PROVIDER_SITE_OTHER): Payer: Medicaid Other | Admitting: Women's Health

## 2018-12-06 ENCOUNTER — Encounter: Payer: Self-pay | Admitting: Women's Health

## 2018-12-06 VITALS — BP 119/78 | HR 102 | Wt 195.0 lb

## 2018-12-06 DIAGNOSIS — O26893 Other specified pregnancy related conditions, third trimester: Secondary | ICD-10-CM | POA: Diagnosis not present

## 2018-12-06 DIAGNOSIS — O1213 Gestational proteinuria, third trimester: Secondary | ICD-10-CM | POA: Diagnosis not present

## 2018-12-06 DIAGNOSIS — Z3483 Encounter for supervision of other normal pregnancy, third trimester: Secondary | ICD-10-CM | POA: Diagnosis not present

## 2018-12-06 DIAGNOSIS — L299 Pruritus, unspecified: Secondary | ICD-10-CM

## 2018-12-06 DIAGNOSIS — R768 Other specified abnormal immunological findings in serum: Secondary | ICD-10-CM | POA: Diagnosis not present

## 2018-12-06 DIAGNOSIS — Z331 Pregnant state, incidental: Secondary | ICD-10-CM

## 2018-12-06 DIAGNOSIS — R8271 Bacteriuria: Secondary | ICD-10-CM | POA: Diagnosis not present

## 2018-12-06 DIAGNOSIS — Z1389 Encounter for screening for other disorder: Secondary | ICD-10-CM

## 2018-12-06 DIAGNOSIS — R809 Proteinuria, unspecified: Secondary | ICD-10-CM | POA: Insufficient documentation

## 2018-12-06 DIAGNOSIS — O0990 Supervision of high risk pregnancy, unspecified, unspecified trimester: Secondary | ICD-10-CM | POA: Insufficient documentation

## 2018-12-06 DIAGNOSIS — R801 Persistent proteinuria, unspecified: Secondary | ICD-10-CM | POA: Diagnosis not present

## 2018-12-06 DIAGNOSIS — Z3A34 34 weeks gestation of pregnancy: Secondary | ICD-10-CM | POA: Diagnosis not present

## 2018-12-06 LAB — POCT URINALYSIS DIPSTICK OB
Blood, UA: NEGATIVE
Glucose, UA: NEGATIVE
Ketones, UA: NEGATIVE
Leukocytes, UA: NEGATIVE
Nitrite, UA: NEGATIVE

## 2018-12-06 MED ORDER — ACYCLOVIR 400 MG PO TABS: 400.0000 mg | ORAL_TABLET | Freq: Three times a day (TID) | ORAL | 3 refills | Status: DC

## 2018-12-06 NOTE — Progress Notes (Signed)
LOW-RISK PREGNANCY VISIT Patient name: Tara Colon MRN [id number]  Date of birth: 05-Jul-1990 Chief Complaint:   Routine Prenatal Visit (NST; itching all over)  History of Present Illness:   Tara Colon is a 28 y.o. G35P1001 female at [redacted]w[redacted]d with an Estimated Date of Delivery: 01/16/19 being seen today for ongoing management of a low-risk pregnancy.  Today she reports itching all over, worse on palms & soles, worse at night. Not fasting this am, had sausage biscuit & hashbrowns.  Denies ha, visual changes, ruq/epigastric pain, n/v. Has had intermittent proteinuria throughout pregnancy. As high as 3+ .Contractions: Not present. Vag. Bleeding: None.  Movement: Present. denies leaking of fluid. Review of Systems:   Pertinent items are noted in HPI Denies abnormal vaginal discharge w/ itching/odor/irritation, headaches, visual changes, shortness of breath, chest pain, abdominal pain, severe nausea/vomiting, or problems with urination or bowel movements unless otherwise stated above. Pertinent History Reviewed:  Reviewed past medical,surgical, social, obstetrical and family history.  Reviewed problem list, medications and allergies. Physical Assessment:   Vitals:   12/06/18 0841  BP: 119/78  Pulse: (!) 102  Weight: 195 lb (88.5 kg)  Body mass index is 29.65 kg/m.        Physical Examination:   General appearance: Well appearing, and in no distress  Mental status: Alert, oriented to person, place, and time  Skin: Warm & dry  Cardiovascular: Normal heart rate noted  Respiratory: Normal respiratory effort, no distress  Abdomen: Soft, gravid, nontender  Pelvic: Cervical exam deferred         Extremities: Edema: None  Fetal Status: Fetal Heart Rate (bpm): 130 Fundal Height: 34 cm Movement: Present   NST: FHR baseline 130 bpm, Variability: moderate, Accelerations:present, Decelerations:  Absent= Cat 1/Reactive Toco: none   Results for orders placed or performed in visit on 12/06/18  (from the past 24 hour(s))  POC Urinalysis Dipstick OB   Collection Time: 12/06/18  8:43 AM  Result Value Ref Range   Color, UA     Clarity, UA     Glucose, UA Negative Negative   Bilirubin, UA     Ketones, UA neg    Spec Grav, UA     Blood, UA neg    pH, UA     POC,PROTEIN,UA Moderate (2+) Negative, Trace, Small (1+), Moderate (2+), Large (3+), 4+   Urobilinogen, UA     Nitrite, UA neg    Leukocytes, UA Negative Negative   Appearance     Odor      Assessment & Plan:  1) Low-risk pregnancy G2P1001 at [redacted]w[redacted]d with an Estimated Date of Delivery: 01/16/19   2) Proteinuria, P:C 0.656, neg urine cx, bp's normal, asymptomatic. Turned in 24hr urine to lab this am. Will check CBC, CMP today. Start checking bp's QID at home. Reviewed pre-e s/s, reasons to seek care.   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 acyclovir (ZOVIRAX) 400 MG tablet    Sig: Take 1 tablet (400 mg total) by mouth 3 (three) times daily.    Dispense:  90 tablet    Refill:  3    Order Specific Question:   Supervising Provider    Answer:   Florian Buff [[id number]]   Labs/procedures today: nst, labs  Plan:  Continue routine obstetrical care  Reviewed: Preterm labor symptoms and general obstetric precautions including but not limited to vaginal bleeding, contractions, leaking of fluid and fetal movement were reviewed in detail with the patient.  All questions were answered.   Follow-up: Return for Monday am for fasting labs, Wed for ob in person.  Orders Placed This Encounter  Procedures  . CBC  . Comprehensive metabolic panel  . Bile acids, total  . POC Urinalysis Dipstick OB   Cheral MarkerKimberly R Marielena Harvell CNM, [facility]NP[facility]BC 12/06/2018 9:37 AM

## 2018-12-06 NOTE — Patient Instructions (Signed)
Nothing to eat or drink after midnight Sunday night, come to the lab Monday morning  For itching:  . Avoid hot showers/baths, take cool/luke-warm showers/baths instead . Cool washcloths to itchy areas . Aquaphor or Eucerin creams to moisturize the skin . Hydrocortisone or Benadryl cream, or Benadryl by mouth for the itching . Oatmeal baths    Tara Colon, I greatly value your feedback.  If you receive a survey following your visit with [place] today, we appreciate you taking the time to fill it out.  Thanks, [name], CNM, [facility]  [facility] HAS MOVED!!! It is now [facility] ([address] [place], [place] [address]) Entrance located off of [address] Free 24/7 valet parking   Go to [email or web address] to register for FREE online childbirth classes    Call the office [phone]) or go to [facility] if:  You begin to have strong, frequent contractions  Your water breaks.  Sometimes it is a big gush of fluid, sometimes it is just a trickle that keeps getting your panties wet or running down your legs  You have vaginal bleeding.  It is normal to have a small amount of spotting if your cervix was checked.   You don't feel your baby moving like normal.  If you don't, get you something to eat and drink and lay down and focus on feeling your baby move.  You should feel at least 10 movements in 2 hours.  If you don't, you should call the office or go to [facility].   Call the office [phone]) or go to [facility] hospital for these signs of pre-eclampsia:  Severe headache that does not go away with Tylenol  Visual changes- seeing spots, double, blurred vision  Pain under your right breast or upper abdomen that does not go away with Tums or heartburn medicine  Nausea and/or vomiting  Severe swelling in your hands, feet, and face    Check your blood pressure 4 times daily and keep a log, bring this with you to all appointments.  If  blood pressure is >=160 on top or >=110 on bottom, check again in 5 minutes, if still this high, call us or go to [facility] to be evaluated   Home Blood Pressure Monitoring for Patients   Your provider has recommended that you check your blood pressure (BP) at least once a week at home. If you do not have a blood pressure cuff at home, one will be provided for you. Contact your provider if you have not received your monitor within 1 week.   Helpful Tips for Accurate Home Blood Pressure Checks  . Don't smoke, exercise, or drink caffeine 30 minutes before checking your BP . Use the restroom before checking your BP (a full bladder can raise your pressure) . Relax in a comfortable upright chair . Feet on the ground . Left arm resting comfortably on a flat surface at the level of your heart . Legs uncrossed . Back supported . Sit quietly and don't talk . Place the cuff on your bare arm . Adjust snuggly, so that only two fingertips can fit between your skin and the top of the cuff . Check 2 readings separated by at least one minute . Keep a log of your BP readings . For a visual, please reference this diagram: http://ccnc.care/bpdiagram  Provider Name: [facility]     Phone: [phone]  Zone 1: ALL CLEAR  Continue to monitor your symptoms:  .  BP reading is less than 140 (top number) or less than 90 (bottom number)  . No right upper stomach pain . No headaches or seeing spots . No feeling nauseated or throwing up . No swelling in face and hands  Zone 2: CAUTION Call your doctor's office for any of the following:  . BP reading is greater than 140 (top number) or greater than 90 (bottom number)  . Stomach pain under your ribs in the middle or right side . Headaches or seeing spots . Feeling nauseated or throwing up . Swelling in face and hands  Zone 3: EMERGENCY  Seek immediate medical care if you have any of the following:  . BP reading is greater than160 (top number)  or greater than 110 (bottom number) . Severe headaches not improving with Tylenol . Serious difficulty catching your breath . Any worsening symptoms from Zone 2    Preterm Labor and Birth Information  The normal length of a pregnancy is 39-41 weeks. Preterm labor is when labor starts before 37 completed weeks of pregnancy. What are the risk factors for preterm labor? Preterm labor is more likely to occur in women who:  Have certain infections during pregnancy such as a bladder infection, sexually transmitted infection, or infection inside the uterus (chorioamnionitis).  Have a shorter-than-normal cervix.  Have gone into preterm labor before.  Have had surgery on their cervix.  Are younger than age 27 or older than age 335.  Are African American.  Are pregnant with twins or multiple babies (multiple gestation).  Take street drugs or smoke while pregnant.  Do not gain enough weight while pregnant.  Became pregnant shortly after having been pregnant. What are the symptoms of preterm labor? Symptoms of preterm labor include:  Cramps similar to those that can happen during a menstrual period. The cramps may happen with diarrhea.  Pain in the abdomen or lower back.  Regular uterine contractions that may feel like tightening of the abdomen.  A feeling of increased pressure in the pelvis.  Increased watery or bloody mucus discharge from the vagina.  Water breaking (ruptured amniotic sac). Why is it important to recognize signs of preterm labor? It is important to recognize signs of preterm labor because babies who are born prematurely may not be fully developed. This can put them at an increased risk for:  Long-term (chronic) heart and lung problems.  Difficulty immediately after birth with regulating body systems, including blood sugar, body temperature, heart rate, and breathing rate.  Bleeding in the brain.  Cerebral palsy.  Learning difficulties.  Death. These  risks are highest for babies who are born before 34 weeks of pregnancy. How is preterm labor treated? Treatment depends on the length of your pregnancy, your condition, and the health of your baby. It may involve:  Having a stitch (suture) placed in your cervix to prevent your cervix from opening too early (cerclage).  Taking or being given medicines, such as: ? Hormone medicines. These may be given early in pregnancy to help support the pregnancy. ? Medicine to stop contractions. ? Medicines to help mature the baby's lungs. These may be prescribed if the risk of delivery is high. ? Medicines to prevent your baby from developing cerebral palsy. If the labor happens before 34 weeks of pregnancy, you may need to stay in the hospital. What should I do if I think I am in preterm labor? If you think that you are going into preterm labor, call your health care provider right  away. How can I prevent preterm labor in future pregnancies? To increase your chance of having a full-term pregnancy:  Do not use any tobacco products, such as cigarettes, chewing tobacco, and e-cigarettes. If you need help quitting, ask your health care provider.  Do not use street drugs or medicines that have not been prescribed to you during your pregnancy.  Talk with your health care provider before taking any herbal supplements, even if you have been taking them regularly.  Make sure you gain a healthy amount of weight during your pregnancy.  Watch for infection. If you think that you might have an infection, get it checked right away.  Make sure to tell your health care provider if you have gone into preterm labor before. This information is not intended to replace advice given to you by your health care provider. Make sure you discuss any questions you have with your health care provider. Document Released: 05/27/2003 Document Revised: 06/28/2018 Document Reviewed: 07/28/2015 Elsevier Patient Education  2020  [employer].

## 2018-12-07 LAB — COMPREHENSIVE METABOLIC PANEL
ALT: 11 IU/L (ref 0–32)
AST: 16 IU/L (ref 0–40)
Albumin/Globulin Ratio: 1.5 (ref 1.2–2.2)
Albumin: 3.7 g/dL — ABNORMAL LOW (ref 3.9–5.0)
Alkaline Phosphatase: 141 IU/L — ABNORMAL HIGH (ref 39–117)
BUN/Creatinine Ratio: 16 (ref 9–23)
BUN: 10 mg/dL (ref 6–20)
Bilirubin Total: <0.2 mg/dL (ref 0.0–1.2)
CO2: 20 mmol/L (ref 20–29)
Calcium: 9.4 mg/dL (ref 8.7–10.2)
Chloride: 101 mmol/L (ref 96–106)
Creatinine, Ser: 0.63 mg/dL (ref 0.57–1.00)
GFR calc Af Amer: 143 mL/min/{1.73_m2} (ref >59)
GFR calc non Af Amer: 124 mL/min/{1.73_m2} (ref >59)
Globulin, Total: 2.5 g/dL (ref 1.5–4.5)
Glucose: 112 mg/dL — ABNORMAL HIGH (ref 65–99)
Potassium: 3.8 mmol/L (ref 3.5–5.2)
Sodium: 137 mmol/L (ref 134–144)
Total Protein: 6.2 g/dL (ref 6.0–8.5)

## 2018-12-07 LAB — CBC
Hematocrit: 32.9 % — ABNORMAL LOW (ref 34.0–46.6)
Hemoglobin: 11.3 g/dL (ref 11.1–15.9)
MCH: 31.6 pg (ref 26.6–33.0)
MCHC: 34.3 g/dL (ref 31.5–35.7)
MCV: 92 fL (ref 79–97)
Platelets: 315 10*3/uL (ref 150–450)
RBC: 3.58 x10E6/uL — ABNORMAL LOW (ref 3.77–5.28)
RDW: 12.2 % (ref 11.7–15.4)
WBC: 7.3 10*3/uL (ref 3.4–10.8)

## 2018-12-07 LAB — PROTEIN, URINE, 24 HOUR
Protein, 24H Urine: 576 mg/24 hr — ABNORMAL HIGH (ref 30–150)
Protein, Ur: 46.1 mg/dL

## 2018-12-09 ENCOUNTER — Other Ambulatory Visit: Payer: Self-pay

## 2018-12-09 ENCOUNTER — Other Ambulatory Visit: Payer: Medicaid Other

## 2018-12-09 ENCOUNTER — Telehealth: Payer: Self-pay | Admitting: *Deleted

## 2018-12-09 DIAGNOSIS — L299 Pruritus, unspecified: Secondary | ICD-10-CM | POA: Diagnosis not present

## 2018-12-09 DIAGNOSIS — Z3483 Encounter for supervision of other normal pregnancy, third trimester: Secondary | ICD-10-CM | POA: Diagnosis not present

## 2018-12-09 NOTE — Telephone Encounter (Signed)
Pt has a vaginal discharge. Clear vaginal discharge, sometimes it's yellow. No odor. Pt has appt on Wednesday. I advised to mention it at her appt on Wednesday. If anything changes before then, call office. Pt voiced understanding. [place]

## 2018-12-09 NOTE — Telephone Encounter (Signed)
Having vaginal discharge.

## 2018-12-11 ENCOUNTER — Ambulatory Visit (INDEPENDENT_AMBULATORY_CARE_PROVIDER_SITE_OTHER): Payer: Medicaid Other | Admitting: Obstetrics and Gynecology

## 2018-12-11 ENCOUNTER — Other Ambulatory Visit: Payer: Self-pay | Admitting: Obstetrics and Gynecology

## 2018-12-11 ENCOUNTER — Other Ambulatory Visit: Payer: Self-pay

## 2018-12-11 VITALS — BP 127/75 | HR 85 | Wt 198.2 lb

## 2018-12-11 DIAGNOSIS — Z3493 Encounter for supervision of normal pregnancy, unspecified, third trimester: Secondary | ICD-10-CM

## 2018-12-11 DIAGNOSIS — Z3A34 34 weeks gestation of pregnancy: Secondary | ICD-10-CM

## 2018-12-11 DIAGNOSIS — Z331 Pregnant state, incidental: Secondary | ICD-10-CM

## 2018-12-11 DIAGNOSIS — Z1389 Encounter for screening for other disorder: Secondary | ICD-10-CM

## 2018-12-11 DIAGNOSIS — B379 Candidiasis, unspecified: Secondary | ICD-10-CM

## 2018-12-11 LAB — POCT URINALYSIS DIPSTICK OB
Blood, UA: NEGATIVE
Glucose, UA: NEGATIVE
Leukocytes, UA: NEGATIVE
Nitrite, UA: NEGATIVE

## 2018-12-11 LAB — BILE ACIDS, TOTAL: Bile Acids Total: 5.2 umol/L (ref 0.0–10.0)

## 2018-12-11 MED ORDER — AMPICILLIN 500 MG PO CAPS: 500.0000 mg | ORAL_CAPSULE | Freq: Four times a day (QID) | ORAL | 0 refills | Status: DC

## 2018-12-11 MED ORDER — FLUCONAZOLE 150 MG PO TABS: 150.0000 mg | ORAL_TABLET | ORAL | 1 refills | Status: DC

## 2018-12-11 MED ORDER — MICONAZOLE NITRATE 2 % VA CREA: 1.0000 | TOPICAL_CREAM | Freq: Every day | VAGINAL | Status: DC

## 2018-12-11 NOTE — Progress Notes (Signed)
Treated for uti +GBS 25K / ml.

## 2018-12-11 NOTE — Progress Notes (Signed)
Has had 1+ Pr thru entire preg. No 24 hr done as baseline in early preg. Cr normal, growth normal. Will add growth u/s at 34-36.

## 2018-12-11 NOTE — Progress Notes (Signed)
Patient ID: Tara Colon, female   DOB: November 22, 1990, 28 y.o.   MRN: [id number]    LOW-RISK PREGNANCY VISIT Patient name: Tara Colon MRN [id number]  Date of birth: [birth year] Chief Complaint:   Routine Prenatal Visit  History of Present Illness:   Tara Colon is a 28 y.o. G61P1001 female at [redacted]w[redacted]d with an Estimated Date of Delivery: 01/16/19 being seen today for ongoing management of a low-risk pregnancy. On Sunday felt gush of fluid but had yellowish color. Doesn't happen often since then and every now and then will have 1 episode of strong gush of fluid and will go back to normal since Sunday. Today she reports vaginal irritation. Contractions: Not present.  .  Movement: Present. Reports some intermittent leaking of fluid. Review of Systems:   Pertinent items are noted in HPI Denies abnormal  headaches, visual changes, shortness of breath, chest pain, abdominal pain, severe nausea/vomiting, or problems with urination or bowel movements unless otherwise stated above. Pertinent History Reviewed:  Reviewed past medical,surgical, social, obstetrical and family history.  Reviewed problem list, medications and allergies. Physical Assessment:   Vitals:   12/11/18 0941  BP: 127/75  Pulse: 85  Weight: 198 lb 3.2 oz (89.9 kg)  Body mass index is 30.14 kg/m.        Physical Examination:   General appearance: Well appearing, and in no distress  Mental status: Alert, oriented to person, place, and time  Skin: Warm & dry  Cardiovascular: Normal heart rate noted  Respiratory: Normal respiratory effort, no distress  Abdomen: Soft, gravid, nontender  Pelvic: Cervical exam performed  WET PREP: heavy clumpy yellowish discharge        Extremities: Edema: None  Fetal Status: Fetal Heart Rate (bpm): 136 Fundal Height: 35 cm Movement: Present    Results for orders placed or performed in visit on 12/11/18 (from the past 24 hour(s))  POC Urinalysis Dipstick OB   Collection Time: 12/11/18  9:55 AM    Result Value Ref Range   Color, UA     Clarity, UA     Glucose, UA Negative Negative   Bilirubin, UA     Ketones, UA small    Spec Grav, UA     Blood, UA neg    pH, UA     POC,PROTEIN,UA Moderate (2+) Negative, Trace, Small (1+), Moderate (2+), Large (3+), 4+   Urobilinogen, UA     Nitrite, UA neg    Leukocytes, UA Negative Negative   Appearance     Odor      Assessment & Plan:  1) Low-risk pregnancy G2P1001 at [redacted]w[redacted]d with an Estimated Date of Delivery: 01/16/19   2) HSV2+, acyclovir for suppression  3) Yeast infection, severe  4) chronic proteinuria thru pregnancy, watch for s&s of Pre-E. Currently w/o symptoms.  Meds: Rx monistat 7 Pv and diflucan 150 mg x 2 Labs/procedures today: None  Plan:  Rx diflucan and vag cream monistat  Reviewed: Preterm labor symptoms and general obstetric precautions including but not limited to vaginal bleeding, contractions, leaking of fluid and fetal movement were reviewed in detail with the patient.  All questions were answered.   Follow-up: Return in about 2 weeks (around 12/25/2018) for GBS/GCCHL.  Orders Placed This Encounter  Procedures   POC Urinalysis Dipstick OB   By signing my name below, I, Samul Dada, attest that this documentation has been prepared under the direction and in the presence of Jonnie Kind, MD. Electronically Signed: [place]. 12/11/18. 10:23 AM.  I personally performed the services described in this documentation, which was SCRIBED in my presence. The recorded information has been reviewed and considered accurate. It has been edited as necessary during review. Jonnie Kind, MD

## 2018-12-12 ENCOUNTER — Encounter: Payer: Self-pay | Admitting: *Deleted

## 2018-12-12 ENCOUNTER — Telehealth: Payer: Self-pay | Admitting: *Deleted

## 2018-12-12 NOTE — Telephone Encounter (Signed)
Patient informed of GBS in urine and antibiotic sent to pharmacy.  Also made aware that she needs growth U/S at next visit.  Appt made. Pt verbalized understanding.

## 2018-12-12 NOTE — Telephone Encounter (Signed)
Patient called and left message that she is calling for results and a prescription.

## 2018-12-14 ENCOUNTER — Inpatient Hospital Stay (HOSPITAL_COMMUNITY)
Admission: AD | Admit: 2018-12-14 | Discharge: 2018-12-17 | DRG: 806 | Disposition: A | Discharge status: Home / Self Care | Payer: Medicaid Other | Attending: Obstetrics & Gynecology | Admitting: Obstetrics & Gynecology

## 2018-12-14 ENCOUNTER — Inpatient Hospital Stay (HOSPITAL_COMMUNITY): Payer: Medicaid Other | Admitting: Anesthesiology

## 2018-12-14 ENCOUNTER — Other Ambulatory Visit: Payer: Self-pay

## 2018-12-14 ENCOUNTER — Encounter (HOSPITAL_COMMUNITY): Payer: Self-pay | Admitting: *Deleted

## 2018-12-14 DIAGNOSIS — A6000 Herpesviral infection of urogenital system, unspecified: Secondary | ICD-10-CM | POA: Diagnosis present

## 2018-12-14 DIAGNOSIS — O99824 Streptococcus B carrier state complicating childbirth: Secondary | ICD-10-CM | POA: Diagnosis not present

## 2018-12-14 DIAGNOSIS — O99344 Other mental disorders complicating childbirth: Secondary | ICD-10-CM | POA: Diagnosis present

## 2018-12-14 DIAGNOSIS — O42913 Preterm premature rupture of membranes, unspecified as to length of time between rupture and onset of labor, third trimester: Principal | ICD-10-CM | POA: Diagnosis present

## 2018-12-14 DIAGNOSIS — R8271 Bacteriuria: Secondary | ICD-10-CM

## 2018-12-14 DIAGNOSIS — F418 Other specified anxiety disorders: Secondary | ICD-10-CM | POA: Diagnosis present

## 2018-12-14 DIAGNOSIS — F17210 Nicotine dependence, cigarettes, uncomplicated: Secondary | ICD-10-CM | POA: Diagnosis present

## 2018-12-14 DIAGNOSIS — O99334 Smoking (tobacco) complicating childbirth: Secondary | ICD-10-CM | POA: Diagnosis present

## 2018-12-14 DIAGNOSIS — O99324 Drug use complicating childbirth: Secondary | ICD-10-CM | POA: Diagnosis present

## 2018-12-14 DIAGNOSIS — Z8751 Personal history of pre-term labor: Secondary | ICD-10-CM

## 2018-12-14 DIAGNOSIS — F1290 Cannabis use, unspecified, uncomplicated: Secondary | ICD-10-CM | POA: Diagnosis present

## 2018-12-14 DIAGNOSIS — Z20828 Contact with and (suspected) exposure to other viral communicable diseases: Secondary | ICD-10-CM | POA: Diagnosis present

## 2018-12-14 DIAGNOSIS — O4202 Full-term premature rupture of membranes, onset of labor within 24 hours of rupture: Secondary | ICD-10-CM | POA: Diagnosis not present

## 2018-12-14 DIAGNOSIS — O9832 Other infections with a predominantly sexual mode of transmission complicating childbirth: Secondary | ICD-10-CM | POA: Diagnosis present

## 2018-12-14 DIAGNOSIS — Z3483 Encounter for supervision of other normal pregnancy, third trimester: Secondary | ICD-10-CM

## 2018-12-14 DIAGNOSIS — O42919 Preterm premature rupture of membranes, unspecified as to length of time between rupture and onset of labor, unspecified trimester: Secondary | ICD-10-CM

## 2018-12-14 DIAGNOSIS — Z3A35 35 weeks gestation of pregnancy: Secondary | ICD-10-CM | POA: Diagnosis not present

## 2018-12-14 DIAGNOSIS — Z23 Encounter for immunization: Secondary | ICD-10-CM | POA: Diagnosis not present

## 2018-12-14 DIAGNOSIS — R768 Other specified abnormal immunological findings in serum: Secondary | ICD-10-CM

## 2018-12-14 HISTORY — DX: Herpesviral infection, unspecified: B00.9

## 2018-12-14 LAB — URINALYSIS, ROUTINE W REFLEX MICROSCOPIC
Bilirubin Urine: NEGATIVE
Glucose, UA: NEGATIVE mg/dL
Hgb urine dipstick: NEGATIVE
Ketones, ur: NEGATIVE mg/dL
Leukocytes,Ua: NEGATIVE
Nitrite: NEGATIVE
Protein, ur: 30 mg/dL — AB
Specific Gravity, Urine: 1.015 (ref 1.005–1.030)
pH: 7 (ref 5.0–8.0)

## 2018-12-14 LAB — RPR: RPR Ser Ql: NONREACTIVE

## 2018-12-14 LAB — CBC
HCT: 34.9 % — ABNORMAL LOW (ref 36.0–46.0)
Hemoglobin: 11.6 g/dL — ABNORMAL LOW (ref 12.0–15.0)
MCH: 31.4 pg (ref 26.0–34.0)
MCHC: 33.2 g/dL (ref 30.0–36.0)
MCV: 94.6 fL (ref 80.0–100.0)
Platelets: 301 10*3/uL (ref 150–400)
RBC: 3.69 MIL/uL — ABNORMAL LOW (ref 3.87–5.11)
RDW: 13 % (ref 11.5–15.5)
WBC: 7.5 10*3/uL (ref 4.0–10.5)
nRBC: 0 % (ref 0.0–0.2)

## 2018-12-14 LAB — TYPE AND SCREEN
ABO/RH(D): A POS
Antibody Screen: NEGATIVE

## 2018-12-14 LAB — POCT FERN TEST: POCT Fern Test: POSITIVE

## 2018-12-14 LAB — ABO/RH: ABO/RH(D): A POS

## 2018-12-14 LAB — SARS CORONAVIRUS 2 BY RT PCR (HOSPITAL ORDER, PERFORMED IN ~~LOC~~ HOSPITAL LAB): SARS Coronavirus 2: NEGATIVE

## 2018-12-14 MED ORDER — OXYCODONE-ACETAMINOPHEN 5-325 MG PO TABS: 1.0000 | ORAL_TABLET | ORAL | Status: DC | PRN

## 2018-12-14 MED ORDER — OXYTOCIN BOLUS FROM INFUSION
500.0000 mL | Freq: Once | INTRAVENOUS | Status: AC
  Administered 2018-12-15: 500 mL via INTRAVENOUS

## 2018-12-14 MED ORDER — TERBUTALINE SULFATE 1 MG/ML IJ SOLN: 0.2500 mg | Freq: Once | INTRAMUSCULAR | Status: DC | PRN

## 2018-12-14 MED ORDER — ONDANSETRON HCL 4 MG/2ML IJ SOLN: 4.0000 mg | Freq: Four times a day (QID) | INTRAMUSCULAR | Status: DC | PRN

## 2018-12-14 MED ORDER — FENTANYL CITRATE (PF) 100 MCG/2ML IJ SOLN: 50.0000 ug | INTRAMUSCULAR | Status: DC | PRN

## 2018-12-14 MED ORDER — SOD CITRATE-CITRIC ACID 500-334 MG/5ML PO SOLN: 30.0000 mL | ORAL | Status: DC | PRN

## 2018-12-14 MED ORDER — OXYTOCIN 40 UNITS IN NORMAL SALINE INFUSION - SIMPLE MED: 2.5000 [IU]/h | INTRAVENOUS | Status: DC

## 2018-12-14 MED ORDER — OXYCODONE-ACETAMINOPHEN 5-325 MG PO TABS: 2.0000 | ORAL_TABLET | ORAL | Status: DC | PRN

## 2018-12-14 MED ORDER — LACTATED RINGERS IV SOLN: 500.0000 mL | INTRAVENOUS | Status: DC | PRN

## 2018-12-14 MED ORDER — BETAMETHASONE SOD PHOS & ACET 6 (3-3) MG/ML IJ SUSP
12.0000 mg | INTRAMUSCULAR | Status: DC
  Administered 2018-12-14: 12 mg via INTRAMUSCULAR
  Filled 2018-12-14 (×2): qty 2

## 2018-12-14 MED ORDER — PHENYLEPHRINE 40 MCG/ML (10ML) SYRINGE FOR IV PUSH (FOR BLOOD PRESSURE SUPPORT): 80.0000 ug | PREFILLED_SYRINGE | INTRAVENOUS | Status: DC | PRN

## 2018-12-14 MED ORDER — LACTATED RINGERS IV SOLN
INTRAVENOUS | Status: DC
  Administered 2018-12-14 – 2018-12-15 (×3): via INTRAVENOUS

## 2018-12-14 MED ORDER — LACTATED RINGERS IV SOLN
500.0000 mL | Freq: Once | INTRAVENOUS | Status: AC
  Administered 2018-12-14: 500 mL via INTRAVENOUS

## 2018-12-14 MED ORDER — PENICILLIN G 3 MILLION UNITS IVPB - SIMPLE MED
3.0000 10*6.[IU] | INTRAVENOUS | Status: DC
  Administered 2018-12-14 – 2018-12-15 (×3): 3 10*6.[IU] via INTRAVENOUS
  Filled 2018-12-14 (×3): qty 100

## 2018-12-14 MED ORDER — SODIUM CHLORIDE 0.9 % IV SOLN
5.0000 10*6.[IU] | Freq: Once | INTRAVENOUS | Status: AC
  Administered 2018-12-14: 13:00:00 5 10*6.[IU] via INTRAVENOUS
  Filled 2018-12-14: qty 5

## 2018-12-14 MED ORDER — MISOPROSTOL 50MCG HALF TABLET
ORAL_TABLET | ORAL | Status: AC
  Filled 2018-12-14: qty 1

## 2018-12-14 MED ORDER — SODIUM CHLORIDE (PF) 0.9 % IJ SOLN
INTRAMUSCULAR | Status: DC | PRN
  Administered 2018-12-14: 12 mL/h via EPIDURAL

## 2018-12-14 MED ORDER — EPHEDRINE 5 MG/ML INJ: 10.0000 mg | INTRAVENOUS | Status: DC | PRN

## 2018-12-14 MED ORDER — FENTANYL-BUPIVACAINE-NACL 0.5-0.125-0.9 MG/250ML-% EP SOLN
12.0000 mL/h | EPIDURAL | Status: DC | PRN
  Filled 2018-12-14: qty 250

## 2018-12-14 MED ORDER — ACETAMINOPHEN 325 MG PO TABS: 650.0000 mg | ORAL_TABLET | ORAL | Status: DC | PRN

## 2018-12-14 MED ORDER — LIDOCAINE HCL (PF) 1 % IJ SOLN: 30.0000 mL | INTRAMUSCULAR | Status: DC | PRN

## 2018-12-14 MED ORDER — MISOPROSTOL 50MCG HALF TABLET
50.0000 ug | ORAL_TABLET | ORAL | Status: DC
  Administered 2018-12-14: 50 ug via BUCCAL

## 2018-12-14 MED ORDER — DIPHENHYDRAMINE HCL 50 MG/ML IJ SOLN: 12.5000 mg | INTRAMUSCULAR | Status: DC | PRN

## 2018-12-14 MED ORDER — LIDOCAINE HCL (PF) 1 % IJ SOLN
INTRAMUSCULAR | Status: DC | PRN
  Administered 2018-12-14: 11 mL via EPIDURAL

## 2018-12-14 MED ORDER — OXYTOCIN 40 UNITS IN NORMAL SALINE INFUSION - SIMPLE MED
1.0000 m[IU]/min | INTRAVENOUS | Status: DC
  Administered 2018-12-14: 17:00:00 2 m[IU]/min via INTRAVENOUS
  Filled 2018-12-14: qty 1000

## 2018-12-14 NOTE — H&P (Signed)
LABOR AND DELIVERY ADMISSION HISTORY AND PHYSICAL NOTE  Tara PoloBrandi Arriaga is a 28 y.o. female G2P1001 with IUP at 8669w2d by 8wk US presenting for PPROM. She reports positive fetal movement. She denies vaginal bleeding or contractions.Leaking of fluid since 0300- clear.   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 Anxiety   . Bipolar affect, depressed (HCC)   . BV (bacterial vaginosis)   . Depression   . Insomnia     Past Surgical History: Past Surgical History:  Procedure Laterality Date  . NO PAST SURGERIES      Obstetrical History: OB History    Gravida  2   Para  1   Term  1   Preterm      AB      Living  1     SAB      TAB      Ectopic      Multiple      Live Births  1           Social History: Social History   Socioeconomic History  . Marital status: Single    Spouse name: Not on file  . Number of children: Not on file  . Years of education: Not on file  . Highest education level: Not on file  Occupational History  . Not on file  Social Needs  . Financial resource strain: Not on file  . Food insecurity    Worry: Not on file    Inability: Not on file  . Transportation needs    Medical: Not on file    Non-medical: Not on file  Tobacco Use  . Smoking status: Current Every Day Smoker    Packs/day: 1.00    Years: 9.00    Pack years: 9.00    Types: Cigarettes  . Smokeless tobacco: Never Used  Substance and Sexual Activity  . Alcohol use: Not Currently    Alcohol/week: 14.0 standard drinks    Types: 14 Cans of beer per week    Comment: 24 oz of beer per day; before pregnancy  . Drug use: Not Currently    Types: Marijuana  . Sexual activity: Yes    Birth control/protection:  None  Lifestyle  . Physical activity    Days per week: Not on file    Minutes per session: Not on file  . Stress: Not on file  Relationships  . Social Musicianconnections    Talks on phone: Not on file    Gets together: Not on file    Attends religious service: Not on file    Active member of club or organization: Not on file    Attends meetings of clubs or organizations: Not on file    Relationship status: Not on file  Other Topics Concern  . Not on file  Social History Narrative  . Not on file    Family History: Family History  Problem Relation Age of Onset  . Anxiety disorder Father   . Depression Father   . Bipolar disorder Father   . Diabetes Maternal Grandmother   . Heart failure Maternal Grandmother   . Drug abuse Paternal Aunt   . Alcohol abuse Paternal Aunt   . Drug abuse Paternal Uncle   .  Alcohol abuse Paternal Uncle   . Alcohol abuse Cousin   . Drug abuse Cousin   . Asthma Brother   . Diabetes Maternal Uncle     Allergies: No Known Allergies  Facility-Administered Medications Prior to Admission  Medication Dose Route Frequency Provider Last Rate Last Dose  . miconazole (MONISTAT 7) 2 % vaginal cream 1 Applicatorful  1 Applicatorful Vaginal QHS Jonnie Kind, MD       Medications Prior to Admission  Medication Sig Dispense Refill Last Dose  . acyclovir (ZOVIRAX) 400 MG tablet Take 1 tablet (400 mg total) by mouth 3 (three) times daily. 90 tablet 3   . ampicillin (PRINCIPEN) 500 MG capsule Take 1 capsule (500 mg total) by mouth 4 (four) times daily. 28 capsule 0   . Blood Pressure Monitor MISC For regular home bp monitoring during pregnancy 1 each 0   . fluconazole (DIFLUCAN) 150 MG tablet Take 1 tablet (150 mg total) by mouth every 3 (three) days. 2 tablet 1   . prenatal vitamin w/FE, FA (PRENATAL 1 + 1) 27-1 MG TABS tablet Take 1 tablet by mouth daily at 12 noon. 30 each 12     Review of Systems  All systems reviewed and negative except as stated in  HPI  Physical Exam Blood pressure (!) 121/46, pulse 94, temperature 98 F (36.7 C), temperature source Oral, resp. rate 18, last menstrual period 04/19/2018, SpO2 100 %. General appearance: alert, cooperative and no distress Lungs: clear to auscultation bilaterally Heart: regular rate and rhythm ABDOMEN: Soft, nontender, gravid PELVIC: Normal external female genitalia. Vagina is pink and rugated. Cervix with normal contour, no lesions. Normal discharge.  Positive pooling. Extremities: No calf swelling or tenderness Presentation: cephalic Fetal monitoring: 140/moderate/+accels/ no decelerations  Uterine activity: irregular mild contractions  Dilation: 2 Effacement (%): 50 Station: -3 Exam by:: Wende Bushy CNM   Results for orders placed or performed during the hospital encounter of 12/14/18 (from the past 24 hour(s))  Fern Test     Status: None   Collection Time: 12/14/18 10:23 AM  Result Value Ref Range   POCT Fern Test Positive = ruptured amniotic membanes     Prenatal labs: ABO, Rh: A/Positive/-- (04/28 1157) Antibody: Negative (08/06 0905) Rubella: 3.42 (04/28 1157) RPR: Non Reactive (08/06 0905)  HBsAg: Negative (04/28 1157)  HIV: Non Reactive (08/06 0905)  GC/Chlamydia: negative  GBS:   Positive - urine  2 hr Glucola: 85-103-102 Genetic screening:  negative Anatomy US: normal female    FAMILY TREE  LAB RESULTS  Language English Pap 02/11/18: neg  Initiated care at 13wk GC/CT Initial:  -/-          36wks:  Dating by 8wk U/S    Support person  Genetics NT/IT:     AFP:      MaterniT21: normal female    [place]/HgbE neg  Flu vaccine 10/19 CF neg  TDaP vaccine 8/6 SMA neg  Rhogam  Fragile X neg       Anatomy US Isolated EICF, otherwise normal female Blood Type A/Positive/-- (04/28 1157)  Feeding Plan bottle Antibody Negative (04/28 1157)  Contraception depo HBsAg Negative (04/28 1157)  Circumcision n/a RPR Non Reactive (04/28 1157)  Pediatrician Olena Heckle in [place] Rubella   3.42 (04/28 1157)  Prenatal Classes  HIV Non Reactive (04/28 1157)    GTT/A1C Early:      26-28wks: 85/103/102  BTL Consent  GBS        [ ]  PCN allergy  VBAC Consent n/a    Waterbirth [ ] Class [ ] Consent [ ] CNM visit PP Needs      Prenatal Transfer Tool  Maternal Diabetes: No Genetic Screening: Normal Maternal Ultrasounds/Referrals: Normal Fetal Ultrasounds or other Referrals:  None Maternal Substance Abuse:  Yes:  Type: Smoker, Marijuana Significant Maternal Medications:  Meds include: Other: Celexa and acyclovir Significant Maternal Lab Results: Group B Strep positive  No results found for this or any previous visit (from the past 24 hour(s)).  Patient Active Problem List   Diagnosis Date Noted  . Proteinuria affecting pregnancy in third trimester 12/06/2018  . GBS bacteriuria 10/29/2018  . 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 Supervision of normal pregnancy 07/16/2018  . 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 Smoker 07/16/2018  . Depression with anxiety 07/16/2018  . PTSD (post-traumatic stress disorder) 05/03/2017  . 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 G2P1001 at [redacted]w[redacted]d here for PPROM  #Labor: IOL for PPROM  #Pain: Plans epidural, pain medication ordered PRN  #FWB: Cat I  #ID:  GBS pos  #MOF: Formula  #MOC:Depo in hospital prior to discharge   Tara Colon, CNM 12/14/2018, 10:40 AM

## 2018-12-14 NOTE — Anesthesia Procedure Notes (Signed)
Epidural Patient location during procedure: OB Start time: 12/14/2018 11:25 PM End time: 12/14/2018 11:40 PM  Staffing Anesthesiologist: Lynda Rainwater, MD Performed: anesthesiologist   Preanesthetic Checklist Completed: patient identified, site marked, surgical consent, pre-op evaluation, timeout performed, IV checked, risks and benefits discussed and monitors and equipment checked  Epidural Patient position: sitting Prep: ChloraPrep Patient monitoring: heart rate, cardiac monitor, continuous pulse ox and blood pressure Approach: midline Location: L2-L3 Injection technique: LOR saline  Needle:  Needle type: Tuohy  Needle gauge: 17 G Needle length: 9 cm Needle insertion depth: 6 cm Catheter type: closed end flexible Catheter size: 20 Guage Catheter at skin depth: 10 cm Test dose: negative  Assessment Events: blood not aspirated, injection not painful, no injection resistance, negative IV test and no paresthesia  Additional Notes Reason for block:procedure for pain

## 2018-12-14 NOTE — Anesthesia Preprocedure Evaluation (Signed)
Anesthesia Evaluation  Patient identified by MRN, date of birth, ID band Patient awake    Reviewed: Allergy & Precautions, NPO status , Patient's Chart, lab work & pertinent test results  Airway Mallampati: II  TM Distance: >3 FB Neck ROM: Full    Dental no notable dental hx.    Pulmonary neg pulmonary ROS, Current Smoker,    Pulmonary exam normal breath sounds clear to auscultation       Cardiovascular negative cardio ROS Normal cardiovascular exam Rhythm:Regular Rate:Normal     Neuro/Psych negative neurological ROS  negative psych ROS   GI/Hepatic negative GI ROS, Neg liver ROS,   Endo/Other  negative endocrine ROS  Renal/GU negative Renal ROS  negative genitourinary   Musculoskeletal negative musculoskeletal ROS (+)   Abdominal   Peds negative pediatric ROS (+)  Hematology negative hematology ROS (+)   Anesthesia Other Findings   Reproductive/Obstetrics (+) Pregnancy                             Anesthesia Physical Anesthesia Plan  ASA: II  Anesthesia Plan: Epidural   Post-op Pain Management:    Induction:   PONV Risk Score and Plan:   Airway Management Planned:   Additional Equipment:   Intra-op Plan:   Post-operative Plan:   Informed Consent:   Plan Discussed with:   Anesthesia Plan Comments:         Anesthesia Quick Evaluation  

## 2018-12-14 NOTE — Progress Notes (Signed)
Labor Progress Note Tara Colon is a 28 y.o. G2P1001 at [redacted]w[redacted]d presented for PPROM  S:  Patient comfortable  O:  BP (!) 101/50   Pulse (!) 102   Temp 98.1 F (36.7 C) (Oral)   Resp 16   Ht 5\' 8"  (1.727 m)   Wt 89.8 kg   LMP 04/19/2018   SpO2 100%   BMI 30.11 kg/m   Fetal Tracing:  Baseline: 125 Variability: moderate Accels: 15x15 Decels: none  Toco: occasional uc's   CVE: Dilation: 3 Effacement (%): 50 Cervical Position: Posterior Station: -3 Presentation: Vertex Exam by:: Sharolyn Douglas CNM   A&P: 28 y.o. G2P1001 [redacted]w[redacted]d PPROM #Labor: Progressing well. Will start pitocin 2x2 #Pain: per patient request #FWB: Cat 1 #GBS positive   Wende Mott, CNM 5:06 PM

## 2018-12-14 NOTE — Progress Notes (Signed)
Kelty Szafran is a 28 y.o. G2P1001 at 104w2d presented for PPROM  Subjective: Feeling mild period cramp like contractions. Would like epidural later.   Objective: BP 111/68   Pulse 87   Temp 98.2 F (36.8 C) (Oral)   Resp 16   Ht 5\' 8"  (1.727 m)   Wt 89.8 kg   LMP 04/19/2018   SpO2 100%   BMI 30.11 kg/m  No intake/output data recorded. No intake/output data recorded.  FHT:  FHR: 125 bpm, variability: moderate,  accelerations:  Present,  decelerations:  Present variables UC:   regular, every 2-5 minutes SVE:   Dilation: 3 Effacement (%): 50 Station: -3 Exam by:: B [employer]: Lab Results  Component Value Date   WBC 7.5 12/14/2018   HGB 11.6 (L) 12/14/2018   HCT 34.9 (L) 12/14/2018   MCV 94.6 12/14/2018   PLT 301 12/14/2018    Assessment / Plan: Mariel Lukins is a 28 y.o. G2P1001 at [redacted]w[redacted]d presented for PPROM  Labor: s/p cytotec x 1 and pitocin. Cervix unchanged from previous.  Fetal Wellbeing:  Category II Pain Control:  IV pain meds desires epidural  I/D:  gbs positive on PCN  Anticipated MOD:  NSVD anticipated.c-section if maternal/fetal indication  Lattie Haw MD PGY-1, [facility]  12/14/2018, 9:09 PM

## 2018-12-14 NOTE — Progress Notes (Signed)
Labor Progress Note Tara Colon is a 28 y.o. G2P1001 at [redacted]w[redacted]d presented for PPROM  S:  Patient comfortable  O:  BP (!) 109/54 (BP Location: Left Arm)   Pulse 95   Temp 98.1 F (36.7 C) (Oral)   Resp 16   Ht 5\' 8"  (1.727 m)   Wt 89.8 kg   LMP 04/19/2018   SpO2 100%   BMI 30.11 kg/m   Fetal Tracing:  Baseline: 130 Variability: moderate Accels:15x15 Decels: none  Toco: 1-5   CVE: Dilation: 3 Effacement (%): 50 Cervical Position: Posterior Station: -3 Presentation: Vertex Exam by:: Sharolyn Douglas CNM   A&P: 28 y.o. G2P1001 [redacted]w[redacted]d PPROM #Labor: Continue to titrate pitocin. Will recheck with change in patient status #Pain: per patient request #FWB: Cat 1 #GBS positive  Wende Mott, CNM 6:53 PM

## 2018-12-14 NOTE — MAU Note (Signed)
Tara Colon is a 28 y.o. at [redacted]w[redacted]d here in MAU reporting: around 0300 started having gushes of fluid while she's using the bathroom. It happened a couple of times. States the gushes soak her panties but do not run down her leg. It has been clear. Has been on medication for yeast infection so she thought it might be that. No bleeding, no pain. States she has felt some FM but less than normal. No recent IC.  Onset of complaint: this morning at 0300  Pain score: 0/10  Vitals:   12/14/18 0936  BP: (!) 105/53  Pulse: (!) 103  Resp: 18  Temp: 98 F (36.7 C)  SpO2: 100%     FHT: 138  Lab orders placed from triage: UA

## 2018-12-15 ENCOUNTER — Encounter (HOSPITAL_COMMUNITY): Payer: Self-pay | Admitting: *Deleted

## 2018-12-15 DIAGNOSIS — O4202 Full-term premature rupture of membranes, onset of labor within 24 hours of rupture: Secondary | ICD-10-CM

## 2018-12-15 DIAGNOSIS — O99824 Streptococcus B carrier state complicating childbirth: Secondary | ICD-10-CM

## 2018-12-15 DIAGNOSIS — Z3A35 35 weeks gestation of pregnancy: Secondary | ICD-10-CM

## 2018-12-15 MED ORDER — COCONUT OIL OIL: 1.0000 "application " | TOPICAL_OIL | Status: DC | PRN

## 2018-12-15 MED ORDER — DIPHENHYDRAMINE HCL 25 MG PO CAPS: 25.0000 mg | ORAL_CAPSULE | Freq: Four times a day (QID) | ORAL | Status: DC | PRN

## 2018-12-15 MED ORDER — INFLUENZA VAC SPLIT QUAD 0.5 ML IM SUSY
0.5000 mL | PREFILLED_SYRINGE | INTRAMUSCULAR | Status: AC
  Administered 2018-12-16: 0.5 mL via INTRAMUSCULAR
  Filled 2018-12-15: qty 0.5

## 2018-12-15 MED ORDER — ZOLPIDEM TARTRATE 5 MG PO TABS: 5.0000 mg | ORAL_TABLET | Freq: Every evening | ORAL | Status: DC | PRN

## 2018-12-15 MED ORDER — SENNOSIDES-DOCUSATE SODIUM 8.6-50 MG PO TABS
2.0000 | ORAL_TABLET | ORAL | Status: DC
  Administered 2018-12-16: 23:00:00 2 via ORAL
  Filled 2018-12-15 (×2): qty 2

## 2018-12-15 MED ORDER — BENZOCAINE-MENTHOL 20-0.5 % EX AERO
1.0000 "application " | INHALATION_SPRAY | CUTANEOUS | Status: DC | PRN
  Administered 2018-12-15: 1 via TOPICAL
  Filled 2018-12-15: qty 56

## 2018-12-15 MED ORDER — PRENATAL MULTIVITAMIN CH
1.0000 | ORAL_TABLET | Freq: Every day | ORAL | Status: DC
  Administered 2018-12-15 – 2018-12-16 (×2): 1 via ORAL
  Filled 2018-12-15 (×2): qty 1

## 2018-12-15 MED ORDER — WITCH HAZEL-GLYCERIN EX PADS: 1.0000 "application " | MEDICATED_PAD | CUTANEOUS | Status: DC | PRN

## 2018-12-15 MED ORDER — DIBUCAINE (PERIANAL) 1 % EX OINT: 1.0000 "application " | TOPICAL_OINTMENT | CUTANEOUS | Status: DC | PRN

## 2018-12-15 MED ORDER — ACETAMINOPHEN 325 MG PO TABS: 650.0000 mg | ORAL_TABLET | ORAL | Status: DC | PRN

## 2018-12-15 MED ORDER — OXYCODONE HCL 5 MG PO TABS: 5.0000 mg | ORAL_TABLET | ORAL | Status: DC | PRN

## 2018-12-15 MED ORDER — IBUPROFEN 600 MG PO TABS
600.0000 mg | ORAL_TABLET | Freq: Four times a day (QID) | ORAL | Status: DC
  Administered 2018-12-15 – 2018-12-17 (×9): 600 mg via ORAL
  Filled 2018-12-15 (×9): qty 1

## 2018-12-15 MED ORDER — ONDANSETRON HCL 4 MG PO TABS: 4.0000 mg | ORAL_TABLET | ORAL | Status: DC | PRN

## 2018-12-15 MED ORDER — SIMETHICONE 80 MG PO CHEW: 80.0000 mg | CHEWABLE_TABLET | ORAL | Status: DC | PRN

## 2018-12-15 MED ORDER — ONDANSETRON HCL 4 MG/2ML IJ SOLN: 4.0000 mg | INTRAMUSCULAR | Status: DC | PRN

## 2018-12-15 MED ORDER — MEDROXYPROGESTERONE ACETATE 150 MG/ML IM SUSP
150.0000 mg | Freq: Once | INTRAMUSCULAR | Status: AC
  Administered 2018-12-15: 10:00:00 150 mg via INTRAMUSCULAR
  Filled 2018-12-15 (×2): qty 1

## 2018-12-15 NOTE — Progress Notes (Signed)
Tara Colon a 28 y.o.G2P1001 at [redacted]w[redacted]d presented for PPROM  Subjective: Feeling comfortable post epidural.  Objective: BP 132/60   Pulse 93   Temp 97.9 F (36.6 C) (Oral)   Resp 16   Ht 5\' 8"  (1.727 m)   Wt 89.8 kg   LMP 04/19/2018   SpO2 100%   BMI 30.11 kg/m  No intake/output data recorded. No intake/output data recorded.  FHT:  FHR: 135 bpm, variability: moderate,  accelerations:  Present,  decelerations:  Absent UC:   regular, every 2-3 minutes SVE:   Dilation: 4.5 Effacement (%): 80 Station: -1, 0 Exam by:: Posey Pronto MD  Labs: Lab Results  Component Value Date   WBC 7.5 12/14/2018   HGB 11.6 (L) 12/14/2018   HCT 34.9 (L) 12/14/2018   MCV 94.6 12/14/2018   PLT 301 12/14/2018    Assessment / Plan: Tara Colon a 28 y.o.G2P1001 at [redacted]w[redacted]d presented for PPROM  Labor: Progressing normally Increase Pitocin dose, inserted IUPC at 01:00. Fetal Wellbeing:  Category I fetal tracing reassuring  Pain Control:  Epidural I/D:  gbs positive Anticipated MOD:  NSVD anticipated, c-section if maternal/fetal indication.  Lattie Haw MD PGY-1, [facility]  12/15/2018, 1:15 AM

## 2018-12-15 NOTE — Anesthesia Postprocedure Evaluation (Signed)
Anesthesia Post Note  Patient: Tara Colon  Procedure(s) Performed: AN AD [place]     Patient location during evaluation: Mother Baby Anesthesia Type: Epidural Level of consciousness: awake and alert and oriented Pain management: satisfactory to patient Vital Signs Assessment: post-procedure vital signs reviewed and stable Respiratory status: spontaneous breathing and nonlabored ventilation Cardiovascular status: stable Postop Assessment: no headache, no backache, no signs of nausea or vomiting, adequate PO intake, patient able to bend at knees and able to ambulate (patient up walking) Anesthetic complications: no    Last Vitals:  Vitals:   12/15/18 0614 12/15/18 1015  BP: 116/66 112/68  Pulse: 87 88  Resp: 18 18  Temp: 37 C 37 C  SpO2:  100%    Last Pain:  Vitals:   12/15/18 1015  TempSrc: Oral  PainSc: 0-No pain   Pain Goal:                   Amarii Bordas

## 2018-12-15 NOTE — Discharge Summary (Signed)
OB Discharge Summary     Patient Name: Tara Colon DOB: 15-Feb-1991 MRN: [id number]  Date of admission: 12/14/2018 Delivering MD: Lattie Haw   Date of discharge: 12/17/2018  Admitting diagnosis: [place] Intrauterine pregnancy: [redacted]w[redacted]d     Secondary diagnosis:  Active Problems:   Preterm premature rupture of membranes  Additional problems: GBS bacteriuria, depression with anxiety, HSV2 on meds, smoker, marijuana use, proteinuria affecting pregnancy in third trimester     Discharge diagnosis: Preterm Pregnancy Delivered                                                                                                Post partum procedures:none  Augmentation: Pitocin and Cytotec  Complications: None  Hospital course:  Onset of Labor With Vaginal Delivery     28 y.o. yo G2P1001 at [redacted]w[redacted]d was admitted for PPROM with clear fluid on 12/14/2018. Patient had an uncomplicated labor course as follows:  Membrane Rupture Time/Date: 3:00 AM ,12/14/2018   Intrapartum Procedures: Episiotomy: None [1]                                         Lacerations:  Periurethral [8]  Patient had a delivery of a Viable infant. 12/15/2018  Information for the patient's newborn:  Gwenn, Teodoro [[id number]]  Delivery Method: Vag-Spont     Pateint had an uncomplicated postpartum course.  She is ambulating, tolerating a regular diet, passing flatus, and urinating well. Patient is discharged home in stable condition on 12/17/18.   Physical exam  Vitals:   12/16/18 1409 12/16/18 1830 12/16/18 2203 12/17/18 0556  BP: (!) 134/91 115/77 107/68 120/69  Pulse: 77 68 66 67  Resp: 16 18 16 18   Temp: 98.4 F (36.9 C) 98.6 F (37 C) 98 F (36.7 C) 98.1 F (36.7 C)  TempSrc: Oral Axillary Oral Oral  SpO2:   100% 100%  Weight:      Height:       General: alert, cooperative and no distress Lochia: appropriate Uterine Fundus: firm Incision: N/A DVT Evaluation: No evidence of DVT seen on physical  exam. Negative Homan's sign. No cords or calf tenderness. No significant calf/ankle edema. Labs: Lab Results  Component Value Date   WBC 7.5 12/14/2018   HGB 11.6 (L) 12/14/2018   HCT 34.9 (L) 12/14/2018   MCV 94.6 12/14/2018   PLT 301 12/14/2018   CMP Latest Ref Rng & Units 12/06/2018  Glucose 65 - 99 mg/dL 112(H)  BUN 6 - 20 mg/dL 10  Creatinine 0.57 - 1.00 mg/dL 0.63  Sodium 134 - 144 mmol/L 137  Potassium 3.5 - 5.2 mmol/L 3.8  Chloride 96 - 106 mmol/L 101  CO2 20 - 29 mmol/L 20  Calcium 8.7 - 10.2 mg/dL 9.4  Total Protein 6.0 - 8.5 g/dL 6.2  Total Bilirubin 0.0 - 1.2 mg/dL <0.2  Alkaline Phos 39 - 117 IU/L 141(H)  AST 0 - 40 IU/L 16  ALT 0 - 32 IU/L 11    Discharge instruction:  per After Visit Summary and "Baby and Me Booklet".  After visit meds:  Allergies as of 12/17/2018   No Known Allergies     Medication List    STOP taking these medications   acyclovir 400 MG tablet Commonly known as: ZOVIRAX   ampicillin 500 MG capsule Commonly known as: PRINCIPEN   fluconazole 150 MG tablet Commonly known as: DIFLUCAN     TAKE these medications   Blood Pressure Monitor Misc For regular home bp monitoring during pregnancy   docusate sodium 100 MG capsule Commonly known as: COLACE Take 1 capsule (100 mg total) by mouth 2 (two) times daily.   ibuprofen 600 MG tablet Commonly known as: ADVIL Take 1 tablet (600 mg total) by mouth every 6 (six) hours.   prenatal vitamin w/FE, FA 27-1 MG Tabs tablet Take 1 tablet by mouth daily at 12 noon.       Diet: routine diet  Activity: Advance as tolerated. Pelvic rest for 6 weeks.   Outpatient follow up:4 weeks Follow up Appt: Please schedule this patient for Postpartum visit in: 4 weeks with the following provider: Any provider For C/S patients schedule nurse incision check in weeks 2 weeks: no Low risk pregnancy complicated by: n/a Delivery mode:  SVD Anticipated Birth Control:  Depo PP Procedures needed: none   Schedule Integrated BH visit: yes   Future Appointments  Date Time Provider Department Center  01/17/2019  9:50 AM Cheral Marker, CNM [facility]   Follow up Visit:No follow-ups on file.  Postpartum contraception: Depo Provera  Newborn Data: Live born female  Birth Weight: 2895 g APGAR: 8, 9  Newborn Delivery   Birth date/time: 12/15/2018 03:06:00 Delivery type: Vaginal, Spontaneous      Baby Feeding: Bottle Disposition:home with mother   12/17/2018 Marlowe Alt, DO

## 2018-12-16 MED ORDER — DOCUSATE SODIUM 100 MG PO CAPS: 100.0000 mg | ORAL_CAPSULE | Freq: Two times a day (BID) | ORAL | 0 refills | Status: DC

## 2018-12-16 MED ORDER — IBUPROFEN 600 MG PO TABS: 600.0000 mg | ORAL_TABLET | Freq: Four times a day (QID) | ORAL | 0 refills | Status: DC

## 2018-12-16 NOTE — Clinical Social Work Maternal (Signed)
CLINICAL SOCIAL WORK MATERNAL/CHILD NOTE  Patient Details  Name: Tara Colon MRN: [id number] Date of Birth: 01/18/1991  Date:  12/16/2018  Clinical Social Worker Initiating Note:  Hewitt Garner Date/Time: Initiated:  12/16/18/1034     Child's Name:  Tara Colon   Biological Parents:  Mother(Tara Colon)   Need for Interpreter:  None   Reason for Referral:  Behavioral Health Concerns, Current Substance Use/Substance Use During Pregnancy    Address:  [address] [place] [address]    Phone number:  [phone] (home)     Additional phone number:   Household Members/Support Persons (HM/SP):   Household Member/Support Person 1   HM/SP Name Relationship DOB or Age  HM/SP -1 Tara Colon Daughter 10/05/2011  HM/SP -2        HM/SP -3        HM/SP -4        HM/SP -5        HM/SP -6        HM/SP -7        HM/SP -8          Natural Supports (not living in the home):  Parent, Immediate Family   Professional Supports: None   Employment: Part-time   Type of Work: [employer]   Education:  9 to 11 years   Homebound arranged:    Financial Resources:  Medicaid   Other Resources:  Food Stamps    Cultural/Religious Considerations Which May Impact Care:    Strengths:  Ability to meet basic needs , Home prepared for child , Pediatrician chosen   Psychotropic Medications:         Pediatrician:    [place]  Pediatrician List:   [place]    [place]    [place]    [place] [facility]  [place]    [place]      Pediatrician Fax Number:    Risk Factors/Current Problems:  Mental Health Concerns , Substance Use    Cognitive State:  Able to Concentrate , Alert , Linear Thinking    Mood/Affect:  Calm , Flat , Comfortable , Interested    CSW Assessment:  CSW received consult for history of anxiety, depression, bipolar and THC use during pregnancy.  CSW met with MOB to  offer support and complete assessment.    MOB sitting up in bed with infant asleep in bassinet and support person asleep at bedside. CSW introduced self and received verbal permission from MOB to complete assessment with support person asleep on the couch. CSW explained reason for consult to which MOB expressed understanding. MOB reported she currently lives with her daughter in [place] and works part-time for [employer]. MOB confirmed she receives food stamps and has been thinking about applying for WIC. CSW inquired about MOB's mental health history and MOB only acknowledged a history of PTSD. CSW asked MOB if she'd ever been diagnosed with anxiety, depression or bipolar disorder and MOB denied all of the above. MOB shared she was prescribed medications but did not take them and when asked about prior counseling, MOB denied having any. CSW aware in chart that MOB saw a counselor through [facility] in 2019 but MOB did not acknowledge this. MOB denied any recent symptoms or concerns in relation to her mental health and denied any PPD following prior pregnancy. CSW provided education regarding the baby blues period vs. perinatal mood disorders, discussed treatment and gave resources for mental health follow up   if concerns arise.  CSW recommends self-evaluation during the postpartum time period using the New Mom Checklist from Postpartum Progress and encouraged MOB to contact a medical professional if symptoms are noted at any time. MOB presented with a flat affect and was minimally engaged but answered questions appropriately. MOB did not appear to be displaying any acute mental health symptoms and denied any current SI or HI. MOB reported having support from her support person, her mother and her brother.   MOB confirmed having all essential items for infant once discharged and reported infant would be sleeping in a crib once home. CSW provided review of Sudden Infant Death Syndrome (SIDS) precautions  and safe sleeping habits.    CSW inquired about MOB's substance use history and MOB acknowledged using THC prior to finding out she was pregnant. CSW informed MOB of Hospital Drug Policy and explained UDS came back negative but that CDS was still pending and that a CPS report would be made, if warranted. MOB denied any questions or concerns regarding policy. As CSW was leaving the room MOB further inquired about possible CPS report and what that means. CSW explained what CPS report may look like if a CPS report was made to which MOB expressed understanding. CSW again asked MOB if there were any questions or concerns regarding policy and if there was any substance use in the last 3 months and MOB reported she didn't know. CSW will continue to monitor CDS and make report, if necessary.   CSW Plan/Description:  No Further Intervention Required/No Barriers to Discharge, Sudden Infant Death Syndrome (SIDS) Education, Perinatal Mood and Anxiety Disorder (PMADs) Education, Hospital Drug Screen Policy Information, CSW Will Continue to Monitor Umbilical Cord Tissue Drug Screen Results and Make Report if Warranted    Cori Henningsen, LCSWA 12/16/2018, 11:34 AM 

## 2018-12-16 NOTE — Discharge Instructions (Signed)

## 2018-12-16 NOTE — Progress Notes (Addendum)
Post Partum Day 1 SVD complicated by left periurethral tear Subjective:  Patient was examined at the bedside this morning. She has no complaints. Denies abdominal pain/cramping or pain at site of laceration. Her lochia has decreased compared to last night. She denies presence of clots. Her diet is progressing and she is ambulating without assistance. She is voiding without issue but has yet to have a bowel movement. Endorses flatulence. Denies fatigue, light headedness, headache, shortness of breath, chest pain or leg swelling.   She is bottle feeding her baby girl. Received her depo shot yesterday.   Objective: Blood pressure 110/74, pulse 67, temperature 98.6 F (37 C), temperature source Oral, resp. rate 16, height 5\' 8"  (1.727 m), weight 89.8 kg, last menstrual period 04/19/2018, SpO2 100 %, unknown if currently breastfeeding.  Physical Exam:  General: alert, cooperative, appears stated age and no distress\ Cardio: RRR, Normal S1, S2 Pulm: Normal work of breathing, CTAB Lochia: appropriate. W/o clots Uterine Fundus: firm DVT Evaluation: No evidence of DVT seen on physical exam.No significant calf/ankle edema.  Recent Labs    12/14/18 1048  HGB 11.6*  HCT 34.9*   Assessment/Plan: Plan for discharge tomorrow, Discharge home, Breastfeeding, Lactation consult, Social Work consult, Circumcision prior to discharge and Contraception Hi  -- Received depo for contraception -- Patient bottle feeding baby girl -- Possible discharge; reassess this evening   LOS: 2 days   Tara Colon 12/16/2018, 7:53 AM    Provider attestation I have seen and examined this patient and agree with above documentation in the med student's note.   Post Partum Day 1  Tara Colon is a 28 y.o. [id number] s/p SVD.  Pt denies problems with ambulating, voiding or po intake. Pain is well controlled. Method of Feeding: bottle  PE:  Gen: well appearing Heart: reg rate Lungs: normal WOB Fundus firm Ext:  soft, no pain, no edema  Assessment: S/p SVD PPD #1  Plan for discharge: tomorrow  Tara Guild, NP 10:39 AM

## 2018-12-16 NOTE — Progress Notes (Signed)
Laurina Bustle RN gave report to Gae Dry RN

## 2018-12-17 NOTE — Progress Notes (Addendum)
Post Partum Day 2 Subjective: Patient is doing well. Up ad lib and tolerating PO. Voiding and has had one bowel movement. Patient endorses back and abdominal pain at a 5/10. States Tylenol and Ibuprofen help, but do not completely alleviate. Lochia is appropriate with no heavy clotting.   Denies fever, headache, visual changes, dizziness, SOB, chest pain, lower extremity swelling.   Objective: Blood pressure 120/69, pulse 67, temperature 98.1 F (36.7 C), temperature source Oral, resp. rate 18, height 5\' 8"  (1.727 m), weight 89.8 kg, last menstrual period 04/19/2018, SpO2 100 %, unknown if currently breastfeeding.  Physical Exam:  General: alert, cooperative, appears stated age and no distress Lochia: appropriate Uterine Fundus: firm DVT Evaluation: No evidence of DVT seen on physical exam. No significant calf/ankle edema.  Recent Labs    12/14/18 1048  HGB 11.6*  HCT 34.9*    Assessment/Plan: Discharge today. Social Work consult was completed on 12/16/2018.  Patient is bottle feeding. Patient received depo shot for birth control.    LOS: 3 days   [place] 12/17/2018, 7:57 AM   GME ATTESTATION:  I saw and evaluated the patient. I agree with the findings and the plan of care as documented in the PA student's note. Patient to be DC'ed home today.  Merilyn Baba, DO OB Fellow, [facility] 12/17/2018 9:22 AM

## 2018-12-25 ENCOUNTER — Encounter: Payer: Medicaid Other | Admitting: Obstetrics and Gynecology

## 2018-12-25 ENCOUNTER — Other Ambulatory Visit: Payer: Medicaid Other

## 2018-12-25 ENCOUNTER — Encounter: Payer: Medicaid Other | Admitting: Advanced Practice Midwife

## 2019-01-17 ENCOUNTER — Ambulatory Visit: Payer: Medicaid Other | Admitting: Women's Health

## 2019-01-20 ENCOUNTER — Ambulatory Visit (INDEPENDENT_AMBULATORY_CARE_PROVIDER_SITE_OTHER): Payer: Medicaid Other | Admitting: Women's Health

## 2019-01-20 ENCOUNTER — Encounter: Payer: Self-pay | Admitting: Women's Health

## 2019-01-20 ENCOUNTER — Other Ambulatory Visit: Payer: Self-pay

## 2019-01-20 DIAGNOSIS — R809 Proteinuria, unspecified: Secondary | ICD-10-CM

## 2019-01-20 DIAGNOSIS — Z8751 Personal history of pre-term labor: Secondary | ICD-10-CM

## 2019-01-20 MED ORDER — MEDROXYPROGESTERONE ACETATE 150 MG/ML IM SUSP: 150.0000 mg | INTRAMUSCULAR | 3 refills | Status: DC

## 2019-01-20 NOTE — Progress Notes (Signed)
POSTPARTUM VISIT Patient name: Tara Colon MRN [id number]  Date of birth: 19-Sep-1990 Chief Complaint:   Postpartum Care  History of Present Illness:   Tara Colon is a 28 y.o. G48P1102 African American female being seen today for a postpartum visit. She is 5 weeks postpartum following a spontaneous vaginal delivery at 35.3 gestational weeks d/t PPROM. Anesthesia: epidural. Laceration: periurethral. I have fully reviewed the prenatal and intrapartum course. Pregnancy complicated by chronic proteinuria, normal urine cultures, not pre-e.  Postpartum course has been uncomplicated. Bleeding thin lochia. Bowel function is normal. Bladder function is normal.  Patient is not sexually active. Last sexual activity: prior to birth of baby.  Contraception method is Depo-Provera injections, got first dose in hospital   Last pap 02/11/18.  Results were normal .  No LMP recorded. Patient has had an injection.  Baby's course has been uncomplicated. Baby is feeding by bottle    Edinburgh Postpartum Depression Screening: negative Edinburgh Postnatal Depression Scale - 01/20/19 1403      Edinburgh Postnatal Depression Scale:  In the Past 7 Days   I have been able to laugh and see the funny side of things.  0    I have looked forward with enjoyment to things.  0    I have blamed myself unnecessarily when things went wrong.  0    I have been anxious or worried for no good reason.  0    I have felt scared or panicky for no good reason.  0    Things have been getting on top of me.  0    I have been so unhappy that I have had difficulty sleeping.  0    I have felt sad or miserable.  0    I have been so unhappy that I have been crying.  0    The thought of harming myself has occurred to me.  0    Edinburgh Postnatal Depression Scale Total  0      Review of Systems:   Pertinent items are noted in HPI Denies Abnormal vaginal discharge w/ itching/odor/irritation, headaches, visual changes, shortness of  breath, chest pain, abdominal pain, severe nausea/vomiting, or problems with urination or bowel movements. Pertinent History Reviewed:  Reviewed past medical,surgical, obstetrical and family history.  Reviewed problem list, medications and allergies. OB History  Gravida Para Term Preterm AB Living  2 2 1 1   2   SAB TAB Ectopic Multiple Live Births        0 2    # Outcome Date GA Lbr Len/2nd Weight Sex Delivery Anes PTL Lv  2 Preterm 12/15/18 [redacted]w[redacted]d / 00:14 6 lb 6.1 oz (2.895 kg) F Vag-Spont EPI  LIV     Birth Comments: wnl  1 Term 10/05/15 [redacted]w[redacted]d  7 lb (3.175 kg) F Vag-Vacuum None N LIV   Physical Assessment:   Vitals:   01/20/19 1401  BP: 116/76  Pulse: 71  Weight: 193 lb 6.4 oz (87.7 kg)  Height: 5\' 8"  (1.727 m)  Body mass index is 29.41 kg/m.       Physical Examination:   General appearance: alert, well appearing, and in no distress  Mental status: alert, oriented to person, place, and time  Skin: warm & dry   Cardiovascular: normal heart rate noted   Respiratory: normal respiratory effort, no distress   Breasts: deferred, no complaints   Abdomen: soft, non-tender   Pelvic: VULVA: normal appearing vulva with no masses, tenderness or lesions  Rectal: no  hemorrhoids  Extremities: no edema       No results found for this or any previous visit (from the past 24 hour(s)).  Assessment & Plan:  1) Postpartum exam 2) 5 wks s/p SVB @ 35wks d/t PPROM 3) Bottlefeeding 4) Depression screening 5) S/P depo in hospital 6) Chronic proteinuria> UA today, if still present, refer to urology  Meds:  Meds ordered this encounter  Medications  . medroxyPROGESTERone (DEPO-PROVERA) 150 MG/ML injection    Sig: Inject 1 mL (150 mg total) into the muscle every 3 (three) months.    Dispense:  1 mL    Refill:  3    Order Specific Question:   Supervising Provider    Answer:   Florian Buff [[id number]]    Follow-up: Return for when due for next depo.   Orders Placed This Encounter   Procedures  . Urinalysis    [place], [facility] 01/20/2019 2:35 PM

## 2019-01-21 LAB — URINALYSIS
Bilirubin, UA: NEGATIVE
Glucose, UA: NEGATIVE
Ketones, UA: NEGATIVE
Leukocytes,UA: NEGATIVE
Nitrite, UA: NEGATIVE
Protein,UA: NEGATIVE
Specific Gravity, UA: 1.02 (ref 1.005–1.030)
Urobilinogen, Ur: 0.2 mg/dL (ref 0.2–1.0)
pH, UA: 5 (ref 5.0–7.5)

## 2019-01-21 LAB — SPECIMEN STATUS REPORT

## 2019-02-06 ENCOUNTER — Other Ambulatory Visit: Payer: Self-pay

## 2019-02-06 ENCOUNTER — Encounter (HOSPITAL_COMMUNITY): Payer: Self-pay

## 2019-02-06 ENCOUNTER — Ambulatory Visit (HOSPITAL_COMMUNITY)
Admission: EM | Admit: 2019-02-06 | Discharge: 2019-02-06 | Disposition: A | Discharge status: Home / Self Care | Payer: Medicaid Other | Attending: Internal Medicine | Admitting: Internal Medicine

## 2019-02-06 DIAGNOSIS — H9203 Otalgia, bilateral: Secondary | ICD-10-CM | POA: Diagnosis not present

## 2019-02-06 MED ORDER — CARBAMIDE PEROXIDE 6.5 % OT SOLN: OTIC | 0 refills | Status: DC

## 2019-02-06 NOTE — ED Provider Notes (Signed)
[facility]    CSN: [id number] Arrival date & time: 02/06/19  8466      History   Chief Complaint Chief Complaint  Patient presents with  . Otalgia    HPI Providence Stivers is a 28 y.o. female with a history of bipolar disorder-controlled comes to urgent care with 3-week history of bilateral ear pain.  Patient is concerned that she may have an ear infection.  Patient has been using a Q-tip to clean her ears.  She does that about twice a week.  On further questioning patient admits to vigorously cleaning her ears with a Q-tip.  She denies any difficulty hearing.  No ringing in the ears.  No fever or chills.  No hearing loss.  HPI  Past Medical History:  Diagnosis Date  . Anxiety   . Bipolar affect, depressed (HCC)   . BV (bacterial vaginosis)   . Depression   . HSV (herpes simplex virus) infection    pt reports no outbreaks  . Insomnia     Patient Active Problem List   Diagnosis Date Noted  . History of preterm delivery 12/14/2018  . Proteinuria 12/06/2018  . 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 Smoker 07/16/2018  . Depression with anxiety 07/16/2018  . PTSD (post-traumatic stress disorder) 05/03/2017  . 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 NO PAST SURGERIES      OB History    Gravida  2   Para  2   Term  1   Preterm  1   AB      Living  2     SAB      TAB      Ectopic      Multiple  0   Live Births  2            Home Medications    Prior to Admission medications   Medication Sig Start Date End Date Taking? Authorizing Provider  carbamide peroxide (DEBROX) 6.5 % OTIC solution AS NEEDED. DO NOT USE FOR MORE THAN 2 DAYS AT A TIME 02/06/19   Merrilee Jansky, MD  medroxyPROGESTERone (DEPO-PROVERA) 150 MG/ML injection Inject 1 mL (150 mg total) into the muscle every 3 (three) months. 01/20/19 02/06/19  Cheral Marker, CNM     Family History Family History  Problem Relation Age of Onset  . Anxiety disorder Father   . Depression Father   . Bipolar disorder Father   . Diabetes Maternal Grandmother   . Heart failure Maternal Grandmother   . Drug abuse Paternal Aunt   . Alcohol abuse Paternal Aunt   . Drug abuse Paternal Uncle   . Alcohol abuse Paternal Uncle   . Alcohol abuse Cousin   . Drug abuse Cousin   . Asthma Brother   . Diabetes Maternal Uncle     Social History Social History   Tobacco Use  . Smoking status: Current Every Day Smoker    Packs/day: 0.25    Years: 9.00    Pack years: 2.25    Types: Cigarettes  . Smokeless tobacco: Never Used  Substance Use Topics  . Alcohol use: Not Currently    Alcohol/week: 14.0 standard drinks    Types: 14 Cans of beer per week    Comment: 24 oz of beer per day; before pregnancy  . Drug use: Not Currently    Types: Marijuana     Allergies  Patient has no known allergies.   Review of Systems Review of Systems  Constitutional: Negative.   HENT: Positive for ear pain. Negative for congestion, dental problem, ear discharge, mouth sores, postnasal drip, rhinorrhea, sinus pressure, sinus pain and sore throat.   Respiratory: Negative.   Cardiovascular: Negative.   Gastrointestinal: Negative.   Musculoskeletal: Negative.      Physical Exam Triage Vital Signs ED Triage Vitals  Enc Vitals Group     BP 02/06/19 0847 128/77     Pulse Rate 02/06/19 0847 74     Resp 02/06/19 0847 18     Temp 02/06/19 0847 98.3 F (36.8 C)     Temp src --      SpO2 02/06/19 0847 100 %     Weight 02/06/19 0846 198 lb (89.8 kg)     Height --      Head Circumference --      Peak Flow --      Pain Score 02/06/19 0846 6     Pain Loc --      Pain Edu? --      Excl. in GC? --    No data found.  Updated Vital Signs BP 128/77   Pulse 74   Temp 98.3 F (36.8 C)   Resp 18   Wt 89.8 kg   SpO2 100%   BMI 30.11 kg/m   Visual Acuity Right Eye Distance:    Left Eye Distance:   Bilateral Distance:    Right Eye Near:   Left Eye Near:    Bilateral Near:     Physical Exam Vitals signs and nursing note reviewed.  Constitutional:      General: She is not in acute distress.    Appearance: Normal appearance. She is not ill-appearing, toxic-appearing or diaphoretic.  HENT:     Right Ear: Tympanic membrane and ear canal normal. There is no impacted cerumen.     Left Ear: Tympanic membrane and ear canal normal. There is no impacted cerumen.     Nose: Nose normal. No congestion or rhinorrhea.     Mouth/Throat:     Mouth: Mucous membranes are moist.     Pharynx: No posterior oropharyngeal erythema.  Cardiovascular:     Rate and Rhythm: Normal rate and regular rhythm.  Pulmonary:     Effort: Pulmonary effort is normal. No respiratory distress.     Breath sounds: Normal breath sounds. No rhonchi.  Abdominal:     General: Bowel sounds are normal. There is no distension.     Tenderness: There is no guarding or rebound.  Neurological:     Mental Status: She is alert.      UC Treatments / Results  Labs (all labs ordered are listed, but only abnormal results are displayed) Labs Reviewed - No data to display  EKG   Radiology No results found.  Procedures Procedures (including critical care time)  Medications Ordered in UC Medications - No data to display  Initial Impression / Assessment and Plan / UC Course  I have reviewed the triage vital signs and the nursing notes.  Pertinent labs & imaging results that were available during my care of the patient were reviewed by me and considered in my medical decision making (see chart for details).     1.  Otalgia in both ears: Patient is advised against routinely cleaning her ears She is concerned that she has too much wax buildup in the ears.  A prescription for Debrox was given to  be used as needed if patient has earwax buildup. Final Clinical Impressions(s) / UC Diagnoses   Final  diagnoses:  Otalgia of both ears   Discharge Instructions   None    ED Prescriptions    Medication Sig Dispense Auth. Provider   carbamide peroxide (DEBROX) 6.5 % OTIC solution AS NEEDED. DO NOT USE FOR MORE THAN 2 DAYS AT A TIME 15 mL Prerna Harold, Myrene Galas, MD     PDMP not reviewed this encounter.   Chase Picket, MD 02/08/19 2147

## 2019-02-06 NOTE — ED Triage Notes (Signed)
Pt states she has a ear infection x 3 weeks.

## 2019-02-27 ENCOUNTER — Encounter: Payer: Self-pay | Admitting: *Deleted

## 2019-03-03 ENCOUNTER — Ambulatory Visit: Payer: Medicaid Other

## 2019-03-07 ENCOUNTER — Other Ambulatory Visit: Payer: Self-pay | Admitting: *Deleted

## 2019-03-07 ENCOUNTER — Ambulatory Visit: Payer: Medicaid Other

## 2019-03-07 ENCOUNTER — Ambulatory Visit (INDEPENDENT_AMBULATORY_CARE_PROVIDER_SITE_OTHER): Payer: Medicaid Other | Admitting: *Deleted

## 2019-03-07 ENCOUNTER — Other Ambulatory Visit: Payer: Self-pay

## 2019-03-07 DIAGNOSIS — Z308 Encounter for other contraceptive management: Secondary | ICD-10-CM | POA: Diagnosis not present

## 2019-03-07 DIAGNOSIS — Z3202 Encounter for pregnancy test, result negative: Secondary | ICD-10-CM | POA: Diagnosis not present

## 2019-03-07 LAB — POCT URINE PREGNANCY: Preg Test, Ur: NEGATIVE

## 2019-03-07 MED ORDER — MEDROXYPROGESTERONE ACETATE 150 MG/ML IM SUSP
150.0000 mg | Freq: Once | INTRAMUSCULAR | Status: AC
  Administered 2019-03-07: 11:00:00 150 mg via INTRAMUSCULAR

## 2019-03-07 MED ORDER — MEDROXYPROGESTERONE ACETATE 150 MG/ML IM SUSP: 150.0000 mg | INTRAMUSCULAR | 3 refills | Status: DC

## 2019-03-07 NOTE — Progress Notes (Signed)
   NURSE VISIT- INJECTION  SUBJECTIVE:  Tara Colon is a 28 y.o. G51P1102 female here for a Depo Provera for contraception/period management. She is a GYN patient.   OBJECTIVE:  There were no vitals taken for this visit.  Appears well, in no apparent distress  Injection administered in: Left deltoid  Meds ordered this encounter  Medications  . medroxyPROGESTERone (DEPO-PROVERA) injection 150 mg    ASSESSMENT: GYN patient Depo Provera for contraception/period management PLAN: Follow-up: in 11-13 weeks for next Depo   Levy Pupa  03/07/2019 11:13 AM

## 2019-03-10 ENCOUNTER — Ambulatory Visit: Payer: Medicaid Other

## 2019-04-15 ENCOUNTER — Ambulatory Visit (INDEPENDENT_AMBULATORY_CARE_PROVIDER_SITE_OTHER): Payer: Medicaid Other | Admitting: Physician Assistant

## 2019-04-15 DIAGNOSIS — B9689 Other specified bacterial agents as the cause of diseases classified elsewhere: Secondary | ICD-10-CM

## 2019-04-15 DIAGNOSIS — N760 Acute vaginitis: Secondary | ICD-10-CM

## 2019-04-15 MED ORDER — METRONIDAZOLE 0.75 % VA GEL: 1.0000 | Freq: Two times a day (BID) | VAGINAL | 0 refills | Status: DC

## 2019-04-15 MED ORDER — FLUCONAZOLE 150 MG PO TABS: 150.0000 mg | ORAL_TABLET | Freq: Once | ORAL | 0 refills | Status: AC

## 2019-04-15 NOTE — Progress Notes (Signed)
      Telephone visit  Subjective: CC: Recurrent vaginal infection PCP: Remus Loffler, PA-C PJA:[id number] Harriss is a 29 y.o. female calls for telephone consult today. Patient provides verbal consent for consult held via phone.  Patient is identified with 2 separate identifiers.  At this time the entire area is on COVID-19 social distancing and stay home orders are in place.  Patient is of higher risk and therefore we are performing this by a virtual method.  Location of patient: Home Location of provider: [facility] Others present for call: No  Patient reports in the last few days she has had an increase of bacterial vaginosis symptoms.  She has had this many times in the past.  I reviewed her chart and she has had treatment.  She knows that the discharge is similar to that.  It has the same type of smell.  She denies any fever or chills.  She states she has a little bit of burning when she voids but it is just at the urethral opening.  She denies any pain that radiates up to her kidneys.   ROS: Per HPI  No Known Allergies Past Medical History:  Diagnosis Date  . Anxiety   . Bipolar affect, depressed (HCC)   . BV (bacterial vaginosis)   . Depression   . HSV (herpes simplex virus) infection    pt reports no outbreaks  . Insomnia     Current Outpatient Medications:  .  carbamide peroxide (DEBROX) 6.5 % OTIC solution, AS NEEDED. DO NOT USE FOR MORE THAN 2 DAYS AT A TIME, Disp: 15 mL, Rfl: 0 .  medroxyPROGESTERone (DEPO-PROVERA) 150 MG/ML injection, Inject 1 mL (150 mg total) into the muscle every 3 (three) months., Disp: 1 mL, Rfl: 3 .  metroNIDAZOLE (METROGEL VAGINAL) 0.75 % vaginal gel, Place 1 Applicatorful vaginally 2 (two) times daily., Disp: 70 g, Rfl: 0  Assessment/ Plan: 29 y.o. female   1. BV (bacterial vaginosis) - metroNIDAZOLE (METROGEL VAGINAL) 0.75 % vaginal gel; Place 1 Applicatorful vaginally 2 (two) times daily.  Dispense: 70 g; Refill: 0 - fluconazole (DIFLUCAN)  150 MG tablet; Take 1 tablet (150 mg total) by mouth once for 1 dose.  Dispense: 1 tablet; Refill: 0   No follow-ups on file.  Continue all other maintenance medications as listed above.  Start time: 9:05 AM End time: 9:14 AM  Meds ordered this encounter  Medications  . metroNIDAZOLE (METROGEL VAGINAL) 0.75 % vaginal gel    Sig: Place 1 Applicatorful vaginally 2 (two) times daily.    Dispense:  70 g    Refill:  0    Order Specific Question:   Supervising Provider    Answer:   Raliegh Ip [[id number]]  . fluconazole (DIFLUCAN) 150 MG tablet    Sig: Take 1 tablet (150 mg total) by mouth once for 1 dose.    Dispense:  1 tablet    Refill:  0    Order Specific Question:   Supervising Provider    Answer:   Raliegh Ip [[id number]]    Prudy Feeler PA-C [facility] [phone]

## 2019-04-20 ENCOUNTER — Encounter: Payer: Self-pay | Admitting: Physician Assistant

## 2019-05-31 ENCOUNTER — Other Ambulatory Visit: Payer: Self-pay | Admitting: Physician Assistant

## 2019-05-31 DIAGNOSIS — N760 Acute vaginitis: Secondary | ICD-10-CM

## 2019-05-31 DIAGNOSIS — B9689 Other specified bacterial agents as the cause of diseases classified elsewhere: Secondary | ICD-10-CM

## 2019-06-01 ENCOUNTER — Other Ambulatory Visit: Payer: Self-pay | Admitting: Physician Assistant

## 2019-06-01 DIAGNOSIS — N760 Acute vaginitis: Secondary | ICD-10-CM

## 2019-06-01 DIAGNOSIS — B9689 Other specified bacterial agents as the cause of diseases classified elsewhere: Secondary | ICD-10-CM

## 2019-06-03 ENCOUNTER — Other Ambulatory Visit: Payer: Self-pay

## 2019-06-03 ENCOUNTER — Ambulatory Visit (INDEPENDENT_AMBULATORY_CARE_PROVIDER_SITE_OTHER): Payer: Medicaid Other | Admitting: *Deleted

## 2019-06-03 DIAGNOSIS — Z308 Encounter for other contraceptive management: Secondary | ICD-10-CM

## 2019-06-03 DIAGNOSIS — Z3042 Encounter for surveillance of injectable contraceptive: Secondary | ICD-10-CM

## 2019-06-03 MED ORDER — MEDROXYPROGESTERONE ACETATE 150 MG/ML IM SUSP
150.0000 mg | Freq: Once | INTRAMUSCULAR | Status: AC
  Administered 2019-06-03: 150 mg via INTRAMUSCULAR

## 2019-06-03 NOTE — Progress Notes (Signed)
   NURSE VISIT- INJECTION  SUBJECTIVE:  Tara Colon is a 29 y.o. G38P1102 female here for a Depo Provera for contraception/period management. She is a GYN patient.   OBJECTIVE:  There were no vitals taken for this visit.  Appears well, in no apparent distress  Injection administered in: Right deltoid  Meds ordered this encounter  Medications  . medroxyPROGESTERone (DEPO-PROVERA) injection 150 mg    ASSESSMENT: GYN patient Depo Provera for contraception/period management PLAN: Follow-up: in 11-13 weeks for next Depo   [place]  06/03/2019 9:06 AM

## 2019-08-06 ENCOUNTER — Other Ambulatory Visit: Payer: Self-pay

## 2019-08-06 ENCOUNTER — Encounter: Payer: Self-pay | Admitting: Adult Health

## 2019-08-06 ENCOUNTER — Ambulatory Visit (INDEPENDENT_AMBULATORY_CARE_PROVIDER_SITE_OTHER): Payer: Medicaid Other | Admitting: Adult Health

## 2019-08-06 VITALS — BP 123/66 | HR 75 | Ht 68.0 in | Wt 219.0 lb

## 2019-08-06 DIAGNOSIS — L292 Pruritus vulvae: Secondary | ICD-10-CM | POA: Diagnosis not present

## 2019-08-06 DIAGNOSIS — N898 Other specified noninflammatory disorders of vagina: Secondary | ICD-10-CM | POA: Insufficient documentation

## 2019-08-06 DIAGNOSIS — N760 Acute vaginitis: Secondary | ICD-10-CM | POA: Diagnosis not present

## 2019-08-06 DIAGNOSIS — B9689 Other specified bacterial agents as the cause of diseases classified elsewhere: Secondary | ICD-10-CM | POA: Diagnosis not present

## 2019-08-06 LAB — POCT WET PREP (WET MOUNT)
Clue Cells Wet Prep Whiff POC: POSITIVE
WBC, Wet Prep HPF POC: POSITIVE

## 2019-08-06 MED ORDER — TRIAMCINOLONE ACETONIDE 0.5 % EX OINT: 1.0000 "application " | TOPICAL_OINTMENT | Freq: Two times a day (BID) | CUTANEOUS | 0 refills | Status: DC

## 2019-08-06 MED ORDER — METRONIDAZOLE 0.75 % VA GEL: 1.0000 | Freq: Every day | VAGINAL | 1 refills | Status: DC

## 2019-08-06 NOTE — Progress Notes (Signed)
  Subjective:     Patient ID: Tara Colon, female   DOB: 1990-07-26, 29 y.o.   MRN: [id number]  HPI Tara Colon is a 29 year old black female, single, G2P2 in complaining of vulva itching and vaginal irritation. PCP is Prudy Feeler PA.   Review of Systems Vulva itching for 5 months Vaginal irritation  Not currently having sex +some cramping  Reviewed past medical,surgical, social and family history. Reviewed medications and allergies.     Objective:   Physical Exam   BP 123/66 (BP Location: Left Arm, Patient Position: Sitting, Cuff Size: Large)   Pulse 75   Ht 5\' 8"  (1.727 m)   Wt 219 lb (99.3 kg)   Breastfeeding No   BMI 33.30 kg/m fall risk is low Skin warm and dry.Pelvic: external genitalia is normal in appearance no lesions, vagina: white discharge with odor,urethra has no lesions or masses noted, cervix:smooth and bulbous, uterus: normal size, shape and contour, non tender, no masses felt, adnexa: no masses or tenderness noted. Bladder is non tender and no masses felt. Wet prep: + for clue cells and +WBCs. Nuswab obtained. Examination chaperoned by LPN    Assessment:     1. Itching of vulva Will rx triamcinolone   2. Vaginal irritation nuswab sent  3. Vaginal discharge nuswab sent  4. BV (bacterial vaginosis) Will rx metrogel 1 applicator in vagina at hs for 5 nights Meds ordered this encounter  Medications  . metroNIDAZOLE (METROGEL VAGINAL) 0.75 % vaginal gel    Sig: Place 1 Applicatorful vaginally at bedtime.    Dispense:  70 g    Refill:  1    Order Specific Question:   Supervising Provider    Answer:   EURE, LUTHER H [[id number]]  . triamcinolone ointment (KENALOG) 0.5 %    Sig: Apply 1 application topically 2 (two) times daily.    Dispense:  30 g    Refill:  0    Order Specific Question:   Supervising Provider    Answer:   Malachy Mood [[id number]]    Plan:     Follow up prn

## 2019-08-09 LAB — NUSWAB VAGINITIS PLUS (VG+)
Candida albicans, NAA: NEGATIVE
Candida glabrata, NAA: NEGATIVE
Chlamydia trachomatis, NAA: NEGATIVE
Neisseria gonorrhoeae, NAA: NEGATIVE
Trich vag by NAA: NEGATIVE

## 2019-08-19 ENCOUNTER — Ambulatory Visit (INDEPENDENT_AMBULATORY_CARE_PROVIDER_SITE_OTHER): Payer: Medicaid Other | Admitting: *Deleted

## 2019-08-19 DIAGNOSIS — Z3042 Encounter for surveillance of injectable contraceptive: Secondary | ICD-10-CM | POA: Diagnosis not present

## 2019-08-19 MED ORDER — MEDROXYPROGESTERONE ACETATE 150 MG/ML IM SUSP
150.0000 mg | Freq: Once | INTRAMUSCULAR | Status: AC
  Administered 2019-08-19: 150 mg via INTRAMUSCULAR

## 2019-08-19 NOTE — Progress Notes (Signed)
   NURSE VISIT- INJECTION  SUBJECTIVE:  Tara Colon is a 29 y.o. G92P1102 female here for a Depo Provera for contraception/period management. She is a GYN patient.   OBJECTIVE:  There were no vitals taken for this visit.  Appears well, in no apparent distress  Injection administered in: Left deltoid  No orders of the defined types were placed in this encounter.   ASSESSMENT: GYN patient Depo Provera for contraception/period management PLAN: Follow-up: in 11-13 weeks for next Depo   Annamarie Dawley  08/19/2019 9:35 AM

## 2019-10-30 DIAGNOSIS — N949 Unspecified condition associated with female genital organs and menstrual cycle: Secondary | ICD-10-CM | POA: Diagnosis not present

## 2019-10-30 DIAGNOSIS — R300 Dysuria: Secondary | ICD-10-CM | POA: Diagnosis not present

## 2019-11-11 ENCOUNTER — Ambulatory Visit (INDEPENDENT_AMBULATORY_CARE_PROVIDER_SITE_OTHER): Payer: Medicaid Other | Admitting: *Deleted

## 2019-11-11 ENCOUNTER — Encounter: Payer: Self-pay | Admitting: *Deleted

## 2019-11-11 DIAGNOSIS — Z3042 Encounter for surveillance of injectable contraceptive: Secondary | ICD-10-CM | POA: Diagnosis not present

## 2019-11-11 MED ORDER — MEDROXYPROGESTERONE ACETATE 150 MG/ML IM SUSP
150.0000 mg | Freq: Once | INTRAMUSCULAR | Status: AC
  Administered 2019-11-11: 150 mg via INTRAMUSCULAR

## 2019-11-11 NOTE — Progress Notes (Signed)
   NURSE VISIT- INJECTION  SUBJECTIVE:  Tara Colon is a 29 y.o. G54P1102 female here for a Depo Provera for contraception/period management. She is a GYN patient.   OBJECTIVE:  There were no vitals taken for this visit.  Appears well, in no apparent distress  Injection administered in: Right deltoid  No orders of the defined types were placed in this encounter.   ASSESSMENT: GYN patient Depo Provera for contraception/period management PLAN: Follow-up: in 11-13 weeks for next Depo   Nance Pear  11/11/2019 9:18 AM

## 2020-01-27 ENCOUNTER — Ambulatory Visit: Payer: Self-pay | Admitting: Nurse Practitioner

## 2020-01-29 ENCOUNTER — Ambulatory Visit (INDEPENDENT_AMBULATORY_CARE_PROVIDER_SITE_OTHER): Payer: Medicaid Other | Admitting: Nurse Practitioner

## 2020-01-29 ENCOUNTER — Other Ambulatory Visit: Payer: Self-pay

## 2020-01-29 ENCOUNTER — Encounter: Payer: Self-pay | Admitting: Nurse Practitioner

## 2020-01-29 VITALS — BP 113/76 | HR 76 | Temp 96.8°F | Ht 68.0 in | Wt 215.6 lb

## 2020-01-29 DIAGNOSIS — Z30019 Encounter for initial prescription of contraceptives, unspecified: Secondary | ICD-10-CM | POA: Diagnosis not present

## 2020-01-29 DIAGNOSIS — L659 Nonscarring hair loss, unspecified: Secondary | ICD-10-CM | POA: Diagnosis not present

## 2020-01-29 MED ORDER — MEDROXYPROGESTERONE ACETATE 150 MG/ML IM SUSP: 150.0000 mg | INTRAMUSCULAR | 3 refills | Status: DC

## 2020-01-29 NOTE — Progress Notes (Signed)
Acute Office Visit  Subjective:    Patient ID: Tara Colon, female    DOB: Jul 29, 1990, 29 y.o.   MRN: [id number]  Chief Complaint  Patient presents with   Alopecia   Referral    Would like Dermatology Referral     HPI Patient is in today for alopecia.  Patient reports this is not new and symptoms have been present since she was a little girl.  Patient reports using hair relaxes as a child but now hair is natural with no chemicals.  Patient reports not wearing tight braids a weeks but trying to avoid future hair loss.  Patient denies pain, irritation or flaking.  Past Medical History:  Diagnosis Date   Anxiety    Bipolar affect, depressed (HCC)    BV (bacterial vaginosis)    Depression    HSV (herpes simplex virus) infection    pt reports no outbreaks   Insomnia     Past Surgical History:  Procedure Laterality Date   NO PAST SURGERIES      Family History  Problem Relation Age of Onset   Anxiety disorder Father    Depression Father    Bipolar disorder Father    Diabetes Maternal Grandmother    Heart failure Maternal Grandmother    Drug abuse Paternal Aunt    Alcohol abuse Paternal Aunt    Drug abuse Paternal Uncle    Alcohol abuse Paternal Uncle    Alcohol abuse Cousin    Drug abuse Cousin    Asthma Brother    Diabetes Maternal Uncle     Social History   Socioeconomic History   Marital status: Single    Spouse name: Not on file   Number of children: Not on file   Years of education: Not on file   Highest education level: Not on file  Occupational History   Not on file  Tobacco Use   Smoking status: Current Every Day Smoker    Packs/day: 0.25    Years: 9.00    Pack years: 2.25    Types: Cigarettes   Smokeless tobacco: Never Used  Building services engineer Use: Never used  Substance and Sexual Activity   Alcohol use: Not Currently    Alcohol/week: 14.0 standard drinks    Types: 14 Cans of beer per week    Comment: 24 oz  of beer per day; before pregnancy   Drug use: Not Currently    Types: Marijuana   Sexual activity: Not Currently    Birth control/protection: Injection  Other Topics Concern   Not on file  Social History Narrative   Not on file   Social Determinants of Health                                                                         Outpatient Medications Prior to Visit  Medication Sig Dispense Refill   medroxyPROGESTERone (DEPO-PROVERA) 150 MG/ML injection Inject 1 mL (150 mg total) into the muscle every 3 (three) months. 1 mL 3   No facility-administered medications prior to visit.    No Known Allergies  Review of Systems  Skin:       Hello loss, alopecia  All other systems reviewed and are negative.  Objective:    Physical Exam Vitals reviewed.  Constitutional:      Appearance: Normal appearance.  HENT:     Head: Normocephalic.     Nose: Nose normal.  Eyes:     Conjunctiva/sclera: Conjunctivae normal.  Cardiovascular:     Pulses: Normal pulses.     Heart sounds: Normal heart sounds.  Pulmonary:     Breath sounds: Normal breath sounds.  Abdominal:     General: Bowel sounds are normal.  Musculoskeletal:        General: Normal range of motion.     Cervical back: Normal range of motion.  Skin:    Comments: Hair loss, alopecia  Neurological:     Mental Status: She is alert and oriented to person, place, and time.  Psychiatric:        Mood and Affect: Mood normal.        Behavior: Behavior normal.     BP 113/76    Pulse 76    Temp (!) 96.8 F (36 C) (Temporal)    Ht 5\' 8"  (1.727 m)    Wt 215 lb 9.6 oz (97.8 kg)    SpO2 96%    BMI 32.78 kg/m  Wt Readings from Last 3 Encounters:  01/29/20 215 lb 9.6 oz (97.8 kg)  08/06/19 219 lb (99.3 kg)  02/06/19 198 lb (89.8 kg)    Health Maintenance Due  Topic Date Due   COVID-19 Vaccine (1) Never done   INFLUENZA VACCINE  10/19/2019    There are no preventive care reminders  to display for this patient.   Lab Results  Component Value Date   TSH 2.650 05/06/2018   Lab Results  Component Value Date   WBC 7.5 12/14/2018   HGB 11.6 (L) 12/14/2018   HCT 34.9 (L) 12/14/2018   MCV 94.6 12/14/2018   PLT 301 12/14/2018   Lab Results  Component Value Date   NA 137 12/06/2018   K 3.8 12/06/2018   CO2 20 12/06/2018   GLUCOSE 112 (H) 12/06/2018   BUN 10 12/06/2018   CREATININE 0.63 12/06/2018   BILITOT <0.2 12/06/2018   ALKPHOS 141 (H) 12/06/2018   AST 16 12/06/2018   ALT 11 12/06/2018   PROT 6.2 12/06/2018   ALBUMIN 3.7 (L) 12/06/2018   CALCIUM 9.4 12/06/2018   ANIONGAP 12 06/13/2018       Assessment & Plan:   Problem List Items Addressed This Visit      Musculoskeletal and Integument   Alopecia - Primary     Other   Encounter for female birth control   Relevant Medications   medroxyPROGESTERone (DEPO-PROVERA) 150 MG/ML injection       Meds ordered this encounter  Medications   medroxyPROGESTERone (DEPO-PROVERA) 150 MG/ML injection    Sig: Inject 1 mL (150 mg total) into the muscle every 3 (three) months.    Dispense:  1 mL    Refill:  3    Order Specific Question:   Supervising Provider    Answer:   06/15/2018 A [[id number]]     Arville Care, NP

## 2020-01-29 NOTE — Assessment & Plan Note (Signed)
Not well managed. Referral to dermatology completed  Follow-up with worsening or unresolved symptoms.

## 2020-01-29 NOTE — Patient Instructions (Signed)
Referral to dermatology completed, advice to reduce tension on edges, follow up with unresolved or worsening symptoms  Alopecia Areata, Adult  Alopecia areata is a condition that causes you to lose hair. You may lose hair on your scalp in patches. In some cases, you may lose all the hair on your scalp (alopecia totalis) or all the hair from your face and body (alopecia universalis). Alopecia areata is an autoimmune disease. This means that your body's defense system (immune system) mistakes normal parts of the body for germs or other things that can make you sick. When you have alopecia areata, the immune system attacks the hair follicles. Alopecia areata usually develops in childhood, but it can develop at any age. For some people, their hair grows back on its own and hair loss does not happen again. For others, their hair may fall out and grow back in cycles. The hair loss may last many years. Having this condition can be emotionally difficult, but it is not dangerous. What are the causes? The cause of this condition is not known. What increases the risk? This condition is more likely to develop in people who have:  A family history of alopecia.  A family history of another autoimmune disease, including type 1 diabetes and rheumatoid arthritis.  Asthma and allergies.  Down syndrome. What are the signs or symptoms? Round spots of patchy hair loss on the scalp is the main symptom of this condition. The spots may be mildly itchy. Other symptoms include:  Short dark hairs in the bald patches that are wider at the top (exclamation point hairs).  Dents, white spots, or lines in the fingernails or toenails.  Balding and body hair loss. This is rare. How is this diagnosed? This condition is diagnosed based on your symptoms and family history. Your health care provider will also check your scalp skin, teeth, and nails. Your health care provider may refer you to a specialist in hair and skin  disorders (dermatologist). You may also have tests, including:  A hair pull test.  Blood tests or other screening tests to check for autoimmune diseases, such as thyroid disease or diabetes.  Skin biopsy to confirm the diagnosis.  A procedure to examine the skin with a lighted magnifying instrument (dermoscopy). How is this treated? There is no cure for alopecia areata. Treatment is aimed at promoting the regrowth of hair and preventing the immune system from overreacting. No single treatment is right for all people with alopecia areata. It depends on the type of hair loss you have and how severe it is. Work with your health care provider to find the best treatment for you. Treatment may include:  Having regular checkups to make sure the condition is not getting worse (watchful waiting).  Steroid creams or pills for 6-8 weeks to stop the immune reaction and help hair to regrow more quickly.  Other topical medicines to alter the immune system response and support the hair growth cycle.  Steroid injections.  Therapy and counseling with a support group or therapist if you are having trouble coping with hair loss. Follow these instructions at home:  Learn as much as you can about your condition.  Apply topical creams only as told by your health care provider.  Take over-the-counter and prescription medicines only as told by your health care provider.  Consider getting a wig or products to make hair look fuller or to cover bald spots, if you feel uncomfortable with your appearance.  Get therapy or counseling  if you are having a hard time coping with hair loss. Ask your health care provider to recommend a counselor or support group.  Keep all follow-up visits as told by your health care provider. This is important. Contact a health care provider if:  Your hair loss gets worse, even with treatment.  You have new symptoms.  You are struggling emotionally. Summary  Alopecia areata  is an autoimmune condition that makes your body's defense system (immune system) attack the hair follicles. This causes you to lose hair.  Treatments may include regular checkups to make sure that the condition is not getting worse (watchful waiting), medicines, and steroid injections. This information is not intended to replace advice given to you by your health care provider. Make sure you discuss any questions you have with your health care provider. Document Revised: 02/16/2017 Document Reviewed: 03/24/2016 Elsevier Patient Education  2020 [employer].

## 2020-01-29 NOTE — Assessment & Plan Note (Signed)
LMP 01/28/2020 till date  Refill sent to pharmacy

## 2020-02-03 ENCOUNTER — Ambulatory Visit (INDEPENDENT_AMBULATORY_CARE_PROVIDER_SITE_OTHER): Payer: Medicaid Other | Admitting: *Deleted

## 2020-02-03 ENCOUNTER — Other Ambulatory Visit: Payer: Self-pay

## 2020-02-03 DIAGNOSIS — Z308 Encounter for other contraceptive management: Secondary | ICD-10-CM

## 2020-02-03 MED ORDER — MEDROXYPROGESTERONE ACETATE 150 MG/ML IM SUSP
150.0000 mg | Freq: Once | INTRAMUSCULAR | Status: AC
  Administered 2020-02-03: 150 mg via INTRAMUSCULAR

## 2020-02-03 NOTE — Progress Notes (Signed)
   NURSE VISIT- INJECTION  SUBJECTIVE:  Tara Colon is a 29 y.o. G104P1102 female here for a Depo Provera for contraception/period management. She is a GYN patient.   OBJECTIVE:  There were no vitals taken for this visit.  Appears well, in no apparent distress  Injection administered in: Left deltoid  Meds ordered this encounter  Medications  . medroxyPROGESTERone (DEPO-PROVERA) injection 150 mg    ASSESSMENT: GYN patient Depo Provera for contraception/period management PLAN: Follow-up: in 11-13 weeks for next Depo   Malachy Mood  02/03/2020 9:23 AM

## 2020-02-14 DIAGNOSIS — R079 Chest pain, unspecified: Secondary | ICD-10-CM | POA: Diagnosis not present

## 2020-02-16 ENCOUNTER — Telehealth: Payer: Self-pay

## 2020-02-16 NOTE — Telephone Encounter (Signed)
Spoke with patient, she reports that she has had no cough or shortness of breath, only chest pain.  Appointment scheduled for follow up with Je on 02/23/2020 at 11:00 am.  Patient aware to go to ER or call 911 with any worsening symptoms.

## 2020-02-23 ENCOUNTER — Other Ambulatory Visit: Payer: Self-pay

## 2020-02-23 ENCOUNTER — Ambulatory Visit: Payer: Medicaid Other | Admitting: Nurse Practitioner

## 2020-02-23 ENCOUNTER — Encounter: Payer: Self-pay | Admitting: Nurse Practitioner

## 2020-02-23 VITALS — BP 113/72 | HR 80 | Temp 97.0°F | Resp 18 | Ht 68.0 in | Wt 218.0 lb

## 2020-02-23 DIAGNOSIS — F419 Anxiety disorder, unspecified: Secondary | ICD-10-CM | POA: Insufficient documentation

## 2020-02-23 DIAGNOSIS — R0789 Other chest pain: Secondary | ICD-10-CM | POA: Diagnosis not present

## 2020-02-23 DIAGNOSIS — Z23 Encounter for immunization: Secondary | ICD-10-CM | POA: Insufficient documentation

## 2020-02-23 MED ORDER — HYDROXYZINE HCL 10 MG PO TABS: 10.0000 mg | ORAL_TABLET | Freq: Three times a day (TID) | ORAL | 0 refills | Status: DC | PRN

## 2020-02-23 MED ORDER — BUSPIRONE HCL 5 MG PO TABS: 5.0000 mg | ORAL_TABLET | Freq: Three times a day (TID) | ORAL | 0 refills | Status: DC

## 2020-02-23 NOTE — Progress Notes (Addendum)
Established Patient Office Visit  Subjective:  Patient ID: Tara Colon, female    DOB: 01-24-1991  Age: 29 y.o. MRN: [id number]  CC:  Chief Complaint  Patient presents with  . ER Follow up    [facility]    HPI Tara Colon presents for Pain  She reports recurrent chest pain. was not an injury that may have caused the pain. The pain started a few days ago and is staying constant. The pain does not radiate. The pain is described as tingling, is moderate in intensity, occurring constantly. Symptoms are worse in the: morning, mid-day, afternoon  Aggravating factors: none Relieving factors: none.  She has tried NSAIDs with little relief.     Anxiety: Patient complains of anxiety disorder.  She has the following symptoms: difficulty concentrating, feelings of losing control, irritable, palpitations. Onset of symptoms was approximately a few months ago, gradually worsening since that time. She denies current suicidal and homicidal ideation. Family history significant for Bipolar disorder.Possible organic causes contributing are: none. Risk factors: positive family history in  father and personality disorder Previous treatment includes unknown and unknown.  She complains of the following side effects from the treatment: Lethargy all day,.     ---------------------------------------------------------------------------------------------------    Past Medical History:  Diagnosis Date  . Anxiety   . Bipolar affect, depressed (HCC)   . BV (bacterial vaginosis)   . Depression   . HSV (herpes simplex virus) infection    pt reports no outbreaks  . Insomnia     Past Surgical History:  Procedure Laterality Date  . NO PAST SURGERIES      Family History  Problem Relation Age of Onset  . Anxiety disorder Father   . Depression Father   . Bipolar disorder Father   . Diabetes Maternal Grandmother   . Heart failure Maternal Grandmother   . Drug abuse Paternal Aunt   . Alcohol  abuse Paternal Aunt   . Drug abuse Paternal Uncle   . Alcohol abuse Paternal Uncle   . Alcohol abuse Cousin   . Drug abuse Cousin   . Asthma Brother   . Diabetes Maternal Uncle     Social History   Socioeconomic History  . Marital status: Single    Spouse name: Not on file  . Number of children: Not on file  . Years of education: Not on file  . Highest education level: Not on file  Occupational History  . Not on file  Tobacco Use  . Smoking status: Current Every Day Smoker    Packs/day: 0.25    Years: 9.00    Pack years: 2.25    Types: Cigarettes  . Smokeless tobacco: Never Used  Vaping Use  . Vaping Use: Never used  Substance and Sexual Activity  . Alcohol use: Not Currently    Alcohol/week: 14.0 standard drinks    Types: 14 Cans of beer per week    Comment: 24 oz of beer per day; before pregnancy  . Drug use: Not Currently    Types: Marijuana  . Sexual activity: Not Currently    Birth control/protection: Injection  Other Topics Concern  . Not on file  Social History Narrative  . Not on file   Social Determinants of Health   Financial Resource Strain:   . Difficulty of Paying Living Expenses: Not on file  Food Insecurity:   . Worried About Programme researcher, broadcasting/film/video in the Last Year: Not on file  . Ran Out of Food in  the Last Year: Not on file  Transportation Needs:   . Lack of Transportation (Medical): Not on file  . Lack of Transportation (Non-Medical): Not on file  Physical Activity:   . Days of Exercise per Week: Not on file  . Minutes of Exercise per Session: Not on file  Stress:   . Feeling of Stress : Not on file  Social Connections:   . Frequency of Communication with Friends and Family: Not on file  . Frequency of Social Gatherings with Friends and Family: Not on file  . Attends Religious Services: Not on file  . Active Member of Clubs or Organizations: Not on file  . Attends Banker Meetings: Not on file  . Marital Status: Not on file   Intimate Partner Violence:   . Fear of Current or Ex-Partner: Not on file  . Emotionally Abused: Not on file  . Physically Abused: Not on file  . Sexually Abused: Not on file    Outpatient Medications Prior to Visit  Medication Sig Dispense Refill  . ibuprofen (ADVIL) 400 MG tablet Take by mouth.    . dexamethasone (DECADRON) 2 MG tablet Take 6 mg by mouth daily.    . medroxyPROGESTERone (DEPO-PROVERA) 150 MG/ML injection Inject 1 mL (150 mg total) into the muscle every 3 (three) months. 1 mL 3   No facility-administered medications prior to visit.     ROS Review of Systems  Cardiovascular: Positive for chest pain.  Psychiatric/Behavioral: Negative for agitation, behavioral problems, confusion, self-injury, sleep disturbance and suicidal ideas. The patient is nervous/anxious.   All other systems reviewed and are negative.     Objective:    Physical Exam Vitals reviewed.  Constitutional:      Appearance: Normal appearance.  HENT:     Head: Normocephalic.     Nose: Nose normal.  Eyes:     Conjunctiva/sclera: Conjunctivae normal.  Cardiovascular:     Rate and Rhythm: Normal rate.     Pulses: Normal pulses.     Heart sounds: Normal heart sounds.  Pulmonary:     Effort: Pulmonary effort is normal.     Breath sounds: Normal breath sounds.  Abdominal:     General: Bowel sounds are normal.  Musculoskeletal:        General: Normal range of motion.  Skin:    General: Skin is warm.  Neurological:     Mental Status: She is alert and oriented to person, place, and time.  Psychiatric:     Comments: Anxiety      BP 113/72   Pulse 80   Temp (!) 97 F (36.1 C) (Temporal)   Resp 18   Ht 5\' 8"  (1.727 m)   Wt 218 lb (98.9 kg)   SpO2 100%   BMI 33.15 kg/m  Wt Readings from Last 3 Encounters:  02/23/20 218 lb (98.9 kg)  01/29/20 215 lb 9.6 oz (97.8 kg)  08/06/19 219 lb (99.3 kg)     Health Maintenance Due  Topic Date Due  . COVID-19 Vaccine (1) Never done     There are no preventive care reminders to display for this patient.  Lab Results  Component Value Date   TSH 2.650 05/06/2018   Lab Results  Component Value Date   WBC 7.5 12/14/2018   HGB 11.6 (L) 12/14/2018   HCT 34.9 (L) 12/14/2018   MCV 94.6 12/14/2018   PLT 301 12/14/2018   Lab Results  Component Value Date   NA 137 12/06/2018  K 3.8 12/06/2018   CO2 20 12/06/2018   GLUCOSE 112 (H) 12/06/2018   BUN 10 12/06/2018   CREATININE 0.63 12/06/2018   BILITOT <0.2 12/06/2018   ALKPHOS 141 (H) 12/06/2018   AST 16 12/06/2018   ALT 11 12/06/2018   PROT 6.2 12/06/2018   ALBUMIN 3.7 (L) 12/06/2018   CALCIUM 9.4 12/06/2018   ANIONGAP 12 06/13/2018      Assessment & Plan:   GAD 7 : Generalized Anxiety Score 02/23/2020  Nervous, Anxious, on Edge 3  Control/stop worrying 3  Worry too much - different things 3  Trouble relaxing 2  Restless 2  Easily annoyed or irritable 3  Afraid - awful might happen 1  Total GAD 7 Score 17  Anxiety Difficulty Somewhat difficult    Problem List Items Addressed This Visit      Other   Anxiety    Not well controlled, patient not on any current medication.  Completed GAD-7. Started patient on BuSpar 5 mg tablet 3 times daily.  Atarax 10 mg tablet 3 times daily as needed Follow-up 4 weeks.  Education provided with printed handouts given.  Patient verbalized understanding.   Rx sent to pharmacy       Relevant Medications   busPIRone (BUSPAR) 5 MG tablet   hydrOXYzine (ATARAX/VISTARIL) 10 MG tablet   Need for immunization against influenza - Primary   Relevant Orders   Flu Vaccine QUAD High Dose(Fluad) (Completed)   Other chest pain    Ongoing chest pain.  Patient reports chest pain is gradually resolving but she still having slight pressure and pain.  Advised patient to continue medications as prescribed from the emergency department, avoid vaping, increase hydration, stress management, decrease anxiety.  Started patient on BuSpar.  Follow-up with worsening or unresolved symptoms. Rx sent to pharmacy.          Meds ordered this encounter  Medications  . busPIRone (BUSPAR) 5 MG tablet    Sig: Take 1 tablet (5 mg total) by mouth 3 (three) times daily.    Dispense:  90 tablet    Refill:  0    Order Specific Question:   Supervising Provider    Answer:   Arville Care A [id number]  . hydrOXYzine (ATARAX/VISTARIL) 10 MG tablet    Sig: Take 1 tablet (10 mg total) by mouth 3 (three) times daily as needed.    Dispense:  30 tablet    Refill:  0    Order Specific Question:   Supervising Provider    Answer:   Arville Care A [[id number]]    Follow-up: Return in about 4 weeks (around 03/22/2020).    Daryll Drown, NP

## 2020-02-23 NOTE — Assessment & Plan Note (Signed)
Ongoing chest pain.  Patient reports chest pain is gradually resolving but she still having slight pressure and pain.  Advised patient to continue medications as prescribed from the emergency department, avoid vaping, increase hydration, stress management, decrease anxiety.  Started patient on BuSpar. Follow-up with worsening or unresolved symptoms. Rx sent to pharmacy.

## 2020-02-23 NOTE — Assessment & Plan Note (Signed)
Not well controlled, patient not on any current medication.  Completed GAD-7. Started patient on BuSpar 5 mg tablet 3 times daily.  Atarax 10 mg tablet 3 times daily as needed Follow-up 4 weeks.  Education provided with printed handouts given.  Patient verbalized understanding.   Rx sent to pharmacy

## 2020-02-23 NOTE — Patient Instructions (Signed)
Generalized Anxiety Disorder, Adult Generalized anxiety disorder (GAD) is a mental health disorder. People with this condition constantly worry about everyday events. Unlike normal anxiety, worry related to GAD is not triggered by a specific event. These worries also do not fade or get better with time. GAD interferes with life functions, including relationships, work, and school. GAD can vary from mild to severe. People with severe GAD can have intense waves of anxiety with physical symptoms (panic attacks). What are the causes? The exact cause of GAD is not known. What increases the risk? This condition is more likely to develop in:  Women.  People who have a family history of anxiety disorders.  People who are very shy.  People who experience very stressful life events, such as the death of a loved one.  People who have a very stressful family environment. What are the signs or symptoms? People with GAD often worry excessively about many things in their lives, such as their health and family. They may also be overly concerned about:  Doing well at work.  Being on time.  Natural disasters.  Friendships. Physical symptoms of GAD include:  Fatigue.  Muscle tension or having muscle twitches.  Trembling or feeling shaky.  Being easily startled.  Feeling like your heart is pounding or racing.  Feeling out of breath or like you cannot take a deep breath.  Having trouble falling asleep or staying asleep.  Sweating.  Nausea, diarrhea, or irritable bowel syndrome (IBS).  Headaches.  Trouble concentrating or remembering facts.  Restlessness.  Irritability. How is this diagnosed? Your health care provider can diagnose GAD based on your symptoms and medical history. You will also have a physical exam. The health care provider will ask specific questions about your symptoms, including how severe they are, when they started, and if they come and go. Your health care  provider may ask you about your use of alcohol or drugs, including prescription medicines. Your health care provider may refer you to a mental health specialist for further evaluation. Your health care provider will do a thorough examination and may perform additional tests to rule out other possible causes of your symptoms. To be diagnosed with GAD, a person must have anxiety that:  Is out of his or her control.  Affects several different aspects of his or her life, such as work and relationships.  Causes distress that makes him or her unable to take part in normal activities.  Includes at least three physical symptoms of GAD, such as restlessness, fatigue, trouble concentrating, irritability, muscle tension, or sleep problems. Before your health care provider can confirm a diagnosis of GAD, these symptoms must be present more days than they are not, and they must last for six months or longer. How is this treated? The following therapies are usually used to treat GAD:  Medicine. Antidepressant medicine is usually prescribed for long-term daily control. Antianxiety medicines may be added in severe cases, especially when panic attacks occur.  Talk therapy (psychotherapy). Certain types of talk therapy can be helpful in treating GAD by providing support, education, and guidance. Options include: ? Cognitive behavioral therapy (CBT). People learn coping skills and techniques to ease their anxiety. They learn to identify unrealistic or negative thoughts and behaviors and to replace them with positive ones. ? Acceptance and commitment therapy (ACT). This treatment teaches people how to be mindful as a way to cope with unwanted thoughts and feelings. ? Biofeedback. This process trains you to manage your body's response (  physiological response) through breathing techniques and relaxation methods. You will work with a therapist while machines are used to monitor your physical symptoms.  Stress  management techniques. These include yoga, meditation, and exercise. A mental health specialist can help determine which treatment is best for you. Some people see improvement with one type of therapy. However, other people require a combination of therapies. Follow these instructions at home:  Take over-the-counter and prescription medicines only as told by your health care provider.  Try to maintain a normal routine.  Try to anticipate stressful situations and allow extra time to manage them.  Practice any stress management or self-calming techniques as taught by your health care provider.  Do not punish yourself for setbacks or for not making progress.  Try to recognize your accomplishments, even if they are small.  Keep all follow-up visits as told by your health care provider. This is important. Contact a health care provider if:  Your symptoms do not get better.  Your symptoms get worse.  You have signs of depression, such as: ? A persistently sad, cranky, or irritable mood. ? Loss of enjoyment in activities that used to bring you joy. ? Change in weight or eating. ? Changes in sleeping habits. ? Avoiding friends or family members. ? Loss of energy for normal tasks. ? Feelings of guilt or worthlessness. Get help right away if:  You have serious thoughts about hurting yourself or others. If you ever feel like you may hurt yourself or others, or have thoughts about taking your own life, get help right away. You can go to your nearest emergency department or call:  Your local emergency services (911 in the [place]  A suicide crisis helpline, such as the National Suicide Prevention Lifeline at 616-268-3930. This is open 24 hours a day. Summary  Generalized anxiety disorder (GAD) is a mental health disorder that involves worry that is not triggered by a specific event.  People with GAD often worry excessively about many things in their lives, such as their health and  family.  GAD may cause physical symptoms such as restlessness, trouble concentrating, sleep problems, frequent sweating, nausea, diarrhea, headaches, and trembling or muscle twitching.  A mental health specialist can help determine which treatment is best for you. Some people see improvement with one type of therapy. However, other people require a combination of therapies. This information is not intended to replace advice given to you by your health care provider. Make sure you discuss any questions you have with your health care provider. Document Revised: 02/16/2017 Document Reviewed: 01/25/2016 [employer] Patient Education  2020 [employer] Chest Wall Pain Chest wall pain is pain in or around the bones and muscles of your chest. Chest wall pain may be caused by:  An injury.  Coughing a lot.  Using your chest and arm muscles too much. Sometimes, the cause may not be known. This pain may take a few weeks or longer to get better. Follow these instructions at home: Managing pain, stiffness, and swelling If told, put ice on the painful area:  Put ice in a plastic bag.  Place a towel between your skin and the bag.  Leave the ice on for 20 minutes, 2-3 times a day.  Activity  Rest as told by your doctor.  Avoid doing things that cause pain. This includes lifting heavy items.  Ask your doctor what activities are safe for you. General instructions   Take over-the-counter and prescription medicines only as told by your  doctor.  Do not use any products that contain nicotine or tobacco, such as cigarettes, e-cigarettes, and chewing tobacco. If you need help quitting, ask your doctor.  Keep all follow-up visits as told by your doctor. This is important. Contact a doctor if:  You have a fever.  Your chest pain gets worse.  You have new symptoms. Get help right away if:  You feel sick to your stomach (nauseous) or you throw up (vomit).  You feel sweaty or  light-headed.  You have a cough with mucus from your lungs (sputum) or you cough up blood.  You are short of breath. These symptoms may be an emergency. Do not wait to see if the symptoms will go away. Get medical help right away. Call your local emergency services (911 in the [place] Do not drive yourself to the hospital. Summary  Chest wall pain is pain in or around the bones and muscles of your chest.  It may be treated with ice, rest, and medicines. Your condition may also get better if you avoid doing things that cause pain.  Contact a doctor if you have a fever, chest pain that gets worse, or new symptoms.  Get help right away if you feel light-headed or you get short of breath. These symptoms may be an emergency. This information is not intended to replace advice given to you by your health care provider. Make sure you discuss any questions you have with your health care provider. Document Revised: 09/06/2017 Document Reviewed: 09/06/2017 Else[employer] Patient Education  2020 [employer].

## 2020-03-29 ENCOUNTER — Encounter: Payer: Self-pay | Admitting: Nurse Practitioner

## 2020-03-29 ENCOUNTER — Other Ambulatory Visit: Payer: Self-pay

## 2020-03-29 ENCOUNTER — Ambulatory Visit: Payer: Medicaid Other | Admitting: Nurse Practitioner

## 2020-03-29 VITALS — BP 109/71 | HR 82 | Temp 97.1°F | Ht 68.0 in | Wt 218.8 lb

## 2020-03-29 DIAGNOSIS — F419 Anxiety disorder, unspecified: Secondary | ICD-10-CM

## 2020-03-29 DIAGNOSIS — F418 Other specified anxiety disorders: Secondary | ICD-10-CM | POA: Diagnosis not present

## 2020-03-29 DIAGNOSIS — M545 Low back pain, unspecified: Secondary | ICD-10-CM | POA: Insufficient documentation

## 2020-03-29 MED ORDER — ESCITALOPRAM OXALATE 10 MG PO TABS: 10.0000 mg | ORAL_TABLET | Freq: Every day | ORAL | 0 refills | Status: DC

## 2020-03-29 MED ORDER — CYCLOBENZAPRINE HCL 5 MG PO TABS: 5.0000 mg | ORAL_TABLET | Freq: Three times a day (TID) | ORAL | 1 refills | Status: DC | PRN

## 2020-03-29 MED ORDER — NAPROXEN 500 MG PO TABS: 500.0000 mg | ORAL_TABLET | Freq: Two times a day (BID) | ORAL | 0 refills | Status: DC

## 2020-03-29 NOTE — Assessment & Plan Note (Signed)
Depression symptoms not well controlled.  Started patient on Lexapro 10 mg tablet once daily follow-up in 4 to 6 weeks.  Medication sent to pharmacy.

## 2020-03-29 NOTE — Patient Instructions (Addendum)
Acute Back Pain, Adult Acute back pain is sudden and usually short-lived. It is often caused by an injury to the muscles and tissues in the back. The injury may result from:  A muscle or ligament getting overstretched or torn (strained). Ligaments are tissues that connect bones to each other. Lifting something improperly can cause a back strain.  Wear and tear (degeneration) of the spinal disks. Spinal disks are circular tissue that provide cushioning between the bones of the spine (vertebrae).  Twisting motions, such as while playing sports or doing yard work.  A hit to the back.  Arthritis. You may have a physical exam, lab tests, and imaging tests to find the cause of your pain. Acute back pain usually goes away with rest and home care. Follow these instructions at home: Managing pain, stiffness, and swelling  Treatment may include medicines for pain and inflammation that are taken by mouth or applied to the skin, prescription pain medicine, or muscle relaxants. Take over-the-counter and prescription medicines only as told by your health care provider.  Your health care provider may recommend applying ice during the first 24-48 hours after your pain starts. To do this: ? Put ice in a plastic bag. ? Place a towel between your skin and the bag. ? Leave the ice on for 20 minutes, 2-3 times a day.  If directed, apply heat to the affected area as often as told by your health care provider. Use the heat source that your health care provider recommends, such as a moist heat pack or a heating pad. ? Place a towel between your skin and the heat source. ? Leave the heat on for 20-30 minutes. ? Remove the heat if your skin turns bright red. This is especially important if you are unable to feel pain, heat, or cold. You have a greater risk of getting burned. Activity  Do not stay in bed. Staying in bed for more than 1-2 days can delay your recovery.  Sit up and stand up straight. Avoid leaning  forward when you sit or hunching over when you stand. ? If you work at a desk, sit close to it so you do not need to lean over. Keep your chin tucked in. Keep your neck drawn back, and keep your elbows bent at a 90-degree angle (right angle). ? Sit high and close to the steering wheel when you drive. Add lower back (lumbar) support to your car seat, if needed.  Take short walks on even surfaces as soon as you are able. Try to increase the length of time you walk each day.  Do not sit, drive, or stand in one place for more than 30 minutes at a time. Sitting or standing for long periods of time can put stress on your back.  Do not drive or use heavy machinery while taking prescription pain medicine.  Use proper lifting techniques. When you bend and lift, use positions that put less stress on your back: ? Bend your knees. ? Keep the load close to your body. ? Avoid twisting.  Exercise regularly as told by your health care provider. Exercising helps your back heal faster and helps prevent back injuries by keeping muscles strong and flexible.  Work with a physical therapist to make a safe exercise program, as recommended by your health care provider. Do any exercises as told by your physical therapist.   Lifestyle  Maintain a healthy weight. Extra weight puts stress on your back and makes it difficult to have   good posture.  Avoid activities or situations that make you feel anxious or stressed. Stress and anxiety increase muscle tension and can make back pain worse. Learn ways to manage anxiety and stress, such as through exercise. General instructions  Sleep on a firm mattress in a comfortable position. Try lying on your side with your knees slightly bent. If you lie on your back, put a pillow under your knees.  Follow your treatment plan as told by your health care provider. This may include: ? Cognitive or behavioral therapy. ? Acupuncture or massage therapy. ? Meditation or yoga. Contact  a health care provider if:  You have pain that is not relieved with rest or medicine.  You have increasing pain going down into your legs or buttocks.  Your pain does not improve after 2 weeks.  You have pain at night.  You lose weight without trying.  You have a fever or chills. Get help right away if:  You develop new bowel or bladder control problems.  You have unusual weakness or numbness in your arms or legs.  You develop nausea or vomiting.  You develop abdominal pain.  You feel faint. Summary  Acute back pain is sudden and usually short-lived.  Use proper lifting techniques. When you bend and lift, use positions that put less stress on your back.  Take over-the-counter and prescription medicines and apply heat or ice as directed by your health care provider. This information is not intended to replace advice given to you by your health care provider. Make sure you discuss any questions you have with your health care provider. Document Revised: 11/28/2019 Document Reviewed: 11/28/2019 Elsevier Patient Education  2021 Elsevier Inc.  Back Exercises These exercises help to make your trunk and back strong. They also help to keep the lower back flexible. Doing these exercises can help to prevent back pain or lessen existing pain.  If you have back pain, try to do these exercises 2-3 times each day or as told by your doctor.  As you get better, do the exercises once each day. Repeat the exercises more often as told by your doctor.  To stop back pain from coming back, do the exercises once each day, or as told by your doctor. Exercises Single knee to chest Do these steps 3-5 times in a row for each leg: 1. Lie on your back on a firm bed or the floor with your legs stretched out. 2. Bring one knee to your chest. 3. Grab your knee or thigh with both hands and hold them it in place. 4. Pull on your knee until you feel a gentle stretch in your lower back or  buttocks. 5. Keep doing the stretch for 10-30 seconds. 6. Slowly let go of your leg and straighten it. Pelvic tilt Do these steps 5-10 times in a row: 1. Lie on your back on a firm bed or the floor with your legs stretched out. 2. Bend your knees so they point up to the ceiling. Your feet should be flat on the floor. 3. Tighten your lower belly (abdomen) muscles to press your lower back against the floor. This will make your tailbone point up to the ceiling instead of pointing down to your feet or the floor. 4. Stay in this position for 5-10 seconds while you gently tighten your muscles and breathe evenly. Cat-cow Do these steps until your lower back bends more easily: 1. Get on your hands and knees on a firm surface. Keep your hands  under your shoulders, and keep your knees under your hips. You may put padding under your knees. 2. Let your head hang down toward your chest. Tighten (contract) the muscles in your belly. Point your tailbone toward the floor so your lower back becomes rounded like the back of a cat. 3. Stay in this position for 5 seconds. 4. Slowly lift your head. Let the muscles of your belly relax. Point your tailbone up toward the ceiling so your back forms a sagging arch like the back of a cow. 5. Stay in this position for 5 seconds.   Press-ups Do these steps 5-10 times in a row: 1. Lie on your belly (face-down) on the floor. 2. Place your hands near your head, about shoulder-width apart. 3. While you keep your back relaxed and keep your hips on the floor, slowly straighten your arms to raise the top half of your body and lift your shoulders. Do not use your back muscles. You may change where you place your hands in order to make yourself more comfortable. 4. Stay in this position for 5 seconds. 5. Slowly return to lying flat on the floor.   Bridges Do these steps 10 times in a row: 1. Lie on your back on a firm surface. 2. Bend your knees so they point up to the ceiling.  Your feet should be flat on the floor. Your arms should be flat at your sides, next to your body. 3. Tighten your butt muscles and lift your butt off the floor until your waist is almost as high as your knees. If you do not feel the muscles working in your butt and the back of your thighs, slide your feet 1-2 inches farther away from your butt. 4. Stay in this position for 3-5 seconds. 5. Slowly lower your butt to the floor, and let your butt muscles relax. If this exercise is too easy, try doing it with your arms crossed over your chest.   Belly crunches Do these steps 5-10 times in a row: 1. Lie on your back on a firm bed or the floor with your legs stretched out. 2. Bend your knees so they point up to the ceiling. Your feet should be flat on the floor. 3. Cross your arms over your chest. 4. Tip your chin a little bit toward your chest but do not bend your neck. 5. Tighten your belly muscles and slowly raise your chest just enough to lift your shoulder blades a tiny bit off of the floor. Avoid raising your body higher than that, because it can put too much stress on your low back. 6. Slowly lower your chest and your head to the floor. Back lifts Do these steps 5-10 times in a row: 1. Lie on your belly (face-down) with your arms at your sides, and rest your forehead on the floor. 2. Tighten the muscles in your legs and your butt. 3. Slowly lift your chest off of the floor while you keep your hips on the floor. Keep the back of your head in line with the curve in your back. Look at the floor while you do this. 4. Stay in this position for 3-5 seconds. 5. Slowly lower your chest and your face to the floor. Contact a doctor if:  Your back pain gets a lot worse when you do an exercise.  Your back pain does not get better 2 hours after you exercise. If you have any of these problems, stop doing the exercises. Do not  do them again unless your doctor says it is okay. Get help right away  if:  You have sudden, very bad back pain. If this happens, stop doing the exercises. Do not do them again unless your doctor says it is okay. This information is not intended to replace advice given to you by your health care provider. Make sure you discuss any questions you have with your health care provider. Document Revised: 11/29/2017 Document Reviewed: 11/29/2017 Elsevier Patient Education  2021 Elsevier Inc.  http://APA.org/depression-guideline"> https://clinicalkey.com"> http://point-of-care.elsevierperformancemanager.com/skills/"> http://point-of-care.elsevierperformancemanager.com">  Managing Depression, Adult Depression is a mental health condition that affects your thoughts, feelings, and actions. Being diagnosed with depression can bring you relief if you did not know why you have felt or behaved a certain way. It could also leave you feeling overwhelmed with uncertainty about your future. Preparing yourself to manage your symptoms can help you feel more positive about your future. How to manage lifestyle changes Managing stress Stress is your body's reaction to life changes and events, both good and bad. Stress can add to your feelings of depression. Learning to manage your stress can help lessen your feelings of depression. Try some of the following approaches to reducing your stress (stress reduction techniques):  Listen to music that you enjoy and that inspires you.  Try using a meditation app or take a meditation class.  Develop a practice that helps you connect with your spiritual self. Walk in nature, pray, or go to a place of worship.  Do some deep breathing. To do this, inhale slowly through your nose. Pause at the top of your inhale for a few seconds and then exhale slowly, letting your muscles relax.  Practice yoga to help relax and work your muscles. Choose a stress reduction technique that suits your lifestyle and personality. These techniques take time and practice  to develop. Set aside 5-15 minutes a day to do them. Therapists can offer training in these techniques. Other things you can do to manage stress include:  Keeping a stress diary.  Knowing your limits and saying no when you think something is too much.  Paying attention to how you react to certain situations. You may not be able to control everything, but you can change your reaction.  Adding humor to your life by watching funny films or TV shows.  Making time for activities that you enjoy and that relax you.   Medicines Medicines, such as antidepressants, are often a part of treatment for depression.  Talk with your pharmacist or health care provider about all the medicines, supplements, and herbal products that you take, their possible side effects, and what medicines and other products are safe to take together.  Make sure to report any side effects you may have to your health care provider. Relationships Your health care provider may suggest family therapy, couples therapy, or individual therapy as part of your treatment. How to recognize changes Everyone responds differently to treatment for depression. As you recover from depression, you may start to:  Have more interest in doing activities.  Feel less hopeless.  Have more energy.  Overeat less often, or have a better appetite.  Have better mental focus. It is important to recognize if your depression is not getting better or is getting worse. The symptoms you had in the beginning may return, such as:  Tiredness (fatigue) or low energy.  Eating too much or too little.  Sleeping too much or too little.  Feeling restless, agitated, or hopeless.  Trouble focusing  or making decisions.  Unexplained physical complaints.  Feeling irritable, angry, or aggressive. If you or your family members notice these symptoms coming back, let your health care provider know right away. Follow these instructions at  home: Activity  Try to get some form of exercise each day, such as walking, biking, swimming, or lifting weights.  Practice stress reduction techniques.  Engage your mind by taking a class or doing some volunteer work.   Lifestyle  Get the right amount and quality of sleep.  Cut down on using caffeine, tobacco, alcohol, and other potentially harmful substances.  Eat a healthy diet that includes plenty of vegetables, fruits, whole grains, low-fat dairy products, and lean protein. Do not eat a lot of foods that are high in solid fats, added sugars, or salt (sodium). General instructions  Take over-the-counter and prescription medicines only as told by your health care provider.  Keep all follow-up visits as told by your health care provider. This is important. Where to find support Talking to others Friends and family members can be sources of support and guidance. Talk to trusted friends or family members about your condition. Explain your symptoms to them, and let them know that you are working with a health care provider to treat your depression. Tell friends and family members how they also can be helpful.   Finances  Find appropriate mental health providers that fit with your financial situation.  Talk with your health care provider about options to get reduced prices on your medicines. Where to find more information You can find support in your area from:  Anxiety and Depression Association of America (ADAA): www.adaa.org  Mental Health [place]: www.mentalhealthamerica.net  [employer] Mental Illness: www.nami.org Contact a health care provider if:  You stop taking your antidepressant medicines, and you have any of these symptoms: ? Nausea. ? Headache. ? Light-headedness. ? Chills and body aches. ? Not being able to sleep (insomnia).  You or your friends and family think your depression is getting worse. Get help right away if:  You have thoughts of  hurting yourself or others. If you ever feel like you may hurt yourself or others, or have thoughts about taking your own life, get help right away. Go to your nearest emergency department or:  Call your local emergency services (911 in the [place]  Call a suicide crisis helpline, such as the National Suicide Prevention Lifeline at 606-725-76481-(940)118-1626. This is open 24 hours a day in the [place]  Text the Crisis Text Line at 365-226-0070741741 (in the [place] Summary  If you are diagnosed with depression, preparing yourself to manage your symptoms is a good way to feel positive about your future.  Work with your health care provider on a management plan that includes stress reduction techniques, medicines (if applicable), therapy, and healthy lifestyle habits.  Keep talking with your health care provider about how your treatment is working.  If you have thoughts about taking your own life, call a suicide crisis helpline or text a crisis text line. This information is not intended to replace advice given to you by your health care provider. Make sure you discuss any questions you have with your health care provider. Document Revised: 01/15/2019 Document Reviewed: 01/15/2019 Elsevier Patient Education  2021 Elsevier Inc.  http://NIMH.NIH.Gov">  Generalized Anxiety Disorder, Adult Generalized anxiety disorder (GAD) is a mental health condition. Unlike normal worries, anxiety related to GAD is not triggered by a specific event. These worries do not fade or get better with  time. GAD interferes with relationships, work, and school. GAD symptoms can vary from mild to severe. People with severe GAD can have intense waves of anxiety with physical symptoms that are similar to panic attacks. What are the causes? The exact cause of GAD is not known, but the following are believed to have an impact:  Differences in natural brain chemicals.  Genes passed down from parents to children.  Differences in the way threats are  perceived.  Development during childhood.  Personality. What increases the risk? The following factors may make you more likely to develop this condition:  Being female.  Having a family history of anxiety disorders.  Being very shy.  Experiencing very stressful life events, such as the death of a loved one.  Having a very stressful family environment. What are the signs or symptoms? People with GAD often worry excessively about many things in their lives, such as their health and family. Symptoms may also include:  Mental and emotional symptoms: ? Worrying excessively about natural disasters. ? Fear of being late. ? Difficulty concentrating. ? Fears that others are judging your performance.  Physical symptoms: ? Fatigue. ? Headaches, muscle tension, muscle twitches, trembling, or feeling shaky. ? Feeling like your heart is pounding or beating very fast. ? Feeling out of breath or like you cannot take a deep breath. ? Having trouble falling asleep or staying asleep, or experiencing restlessness. ? Sweating. ? Nausea, diarrhea, or irritable bowel syndrome (IBS).  Behavioral symptoms: ? Experiencing erratic moods or irritability. ? Avoidance of new situations. ? Avoidance of people. ? Extreme difficulty making decisions. How is this diagnosed? This condition is diagnosed based on your symptoms and medical history. You will also have a physical exam. Your health care provider may perform tests to rule out other possible causes of your symptoms. To be diagnosed with GAD, a person must have anxiety that:  Is out of his or her control.  Affects several different aspects of his or her life, such as work and relationships.  Causes distress that makes him or her unable to take part in normal activities.  Includes at least three symptoms of GAD, such as restlessness, fatigue, trouble concentrating, irritability, muscle tension, or sleep problems. Before your health care  provider can confirm a diagnosis of GAD, these symptoms must be present more days than they are not, and they must last for 6 months or longer. How is this treated? This condition may be treated with:  Medicine. Antidepressant medicine is usually prescribed for long-term daily control. Anti-anxiety medicines may be added in severe cases, especially when panic attacks occur.  Talk therapy (psychotherapy). Certain types of talk therapy can be helpful in treating GAD by providing support, education, and guidance. Options include: ? Cognitive behavioral therapy (CBT). People learn coping skills and self-calming techniques to ease their physical symptoms. They learn to identify unrealistic thoughts and behaviors and to replace them with more appropriate thoughts and behaviors. ? Acceptance and commitment therapy (ACT). This treatment teaches people how to be mindful as a way to cope with unwanted thoughts and feelings. ? Biofeedback. This process trains you to manage your body's response (physiological response) through breathing techniques and relaxation methods. You will work with a therapist while machines are used to monitor your physical symptoms.  Stress management techniques. These include yoga, meditation, and exercise. A mental health specialist can help determine which treatment is best for you. Some people see improvement with one type of therapy. However, other people require  a combination of therapies.   Follow these instructions at home: Lifestyle  Maintain a consistent routine and schedule.  Anticipate stressful situations. Create a plan, and allow extra time to work with your plan.  Practice stress management or self-calming techniques that you have learned from your therapist or your health care provider. General instructions  Take over-the-counter and prescription medicines only as told by your health care provider.  Understand that you are likely to have setbacks. Accept this  and be kind to yourself as you persist to take better care of yourself.  Recognize and accept your accomplishments, even if you judge them as small.  Keep all follow-up visits as told by your health care provider. This is important. Contact a health care provider if:  Your symptoms do not get better.  Your symptoms get worse.  You have signs of depression, such as: ? A persistently sad or irritable mood. ? Loss of enjoyment in activities that used to bring you joy. ? Change in weight or eating. ? Changes in sleeping habits. ? Avoiding friends or family members. ? Loss of energy for normal tasks. ? Feelings of guilt or worthlessness. Get help right away if:  You have serious thoughts about hurting yourself or others. If you ever feel like you may hurt yourself or others, or have thoughts about taking your own life, get help right away. Go to your nearest emergency department or:  Call your local emergency services (911 in the [place]  Call a suicide crisis helpline, such as the National Suicide Prevention Lifeline at 516-666-06451-918-209-7688. This is open 24 hours a day in the [place]  Text the Crisis Text Line at 403 753 2127741741 (in the [place] Summary  Generalized anxiety disorder (GAD) is a mental health condition that involves worry that is not triggered by a specific event.  People with GAD often worry excessively about many things in their lives, such as their health and family.  GAD may cause symptoms such as restlessness, trouble concentrating, sleep problems, frequent sweating, nausea, diarrhea, headaches, and trembling or muscle twitching.  A mental health specialist can help determine which treatment is best for you. Some people see improvement with one type of therapy. However, other people require a combination of therapies. This information is not intended to replace advice given to you by your health care provider. Make sure you discuss any questions you have with your health care  provider. Document Revised: 12/25/2018 Document Reviewed: 12/25/2018 Elsevier Patient Education  2021 [employer]

## 2020-03-29 NOTE — Assessment & Plan Note (Addendum)
Bilateral lower back pain without sciatica is new for patient.  Symptoms are not well controlled.  Provided education to patient with printed handouts given.  Advised patient to start back strengthening exercises.  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  Patient has since stopped the job but continues to have lower back pain.  Started patient on naproxen 500 mg tablet by mouth for 2 weeks, Flexeril as needed, patient rates pain 7 out of 10 from pain scale of 0-10.  Follow-up with worsening unresolved symptoms. Rx sent to pharmacy.

## 2020-03-29 NOTE — Assessment & Plan Note (Signed)
Anxiety not well controlled on current medication.  Patient reports not liking how medication made her feel.  Discontinue BuSpar.  Started patient on Lexapro 10 mg tablet daily by mouth follow-up in 4 to 6 weeks.  Rx sent to pharmacy

## 2020-03-29 NOTE — Progress Notes (Signed)
Acute Office Visit  Subjective:    Patient ID: Tara Colon, female    DOB: February 18, 1991, 30 y.o.   MRN: [id number]  Chief Complaint  Patient presents with  . Back Pain    Patient states that she has been lifting heavy stuff at work and now having lower back pain x 3 weeks     Back Pain This is a new problem. The current episode started 1 to 4 weeks ago. The problem has been gradually worsening since onset. The pain is present in the lumbar spine. The quality of the pain is described as aching. The pain does not radiate. The pain is at a severity of 7/10. The pain is the same all the time. The symptoms are aggravated by bending, position, twisting and standing. Stiffness is present all day. Pertinent negatives include no bowel incontinence, headaches, leg pain, numbness, pelvic pain or tingling. Risk factors: Lifting on the job. She has tried NSAIDs for the symptoms. The treatment provided no relief.   Depression: Patient complains of depression. She complains of depressed mood. Onset was approximately 2 years ago, gradually worsening since that time.  She denies current suicidal and homicidal plan or intent.   Family history significant for no psychiatric illness.Possible organic causes contributing are: none.  Risk factors: previous episode of depression Previous treatment includes BuSpar . She complains of the following side effects from the treatment: none.  Anxiety: Patient complains of anxiety disorder.  She has the following symptoms: difficulty concentrating, fatigue, feelings of losing control, palpitations. Onset of symptoms was approximately 2 years ago, gradually worsening since that time. She denies current suicidal and homicidal ideation. Family history significant for no psychiatric illness.Possible organic causes contributing are: none. Risk factors: previous episode of depression Previous treatment includes BuSpar and Hydroxyzine.  She complains of the following side effects from  the treatment: none.   Past Medical History:  Diagnosis Date  . Anxiety   . Bipolar affect, depressed (HCC)   . BV (bacterial vaginosis)   . Depression   . HSV (herpes simplex virus) infection    pt reports no outbreaks  . Insomnia     Past Surgical History:  Procedure Laterality Date  . NO PAST SURGERIES      Family History  Problem Relation Age of Onset  . Anxiety disorder Father   . Depression Father   . Bipolar disorder Father   . Diabetes Maternal Grandmother   . Heart failure Maternal Grandmother   . Drug abuse Paternal Aunt   . Alcohol abuse Paternal Aunt   . Drug abuse Paternal Uncle   . Alcohol abuse Paternal Uncle   . Alcohol abuse Cousin   . Drug abuse Cousin   . Asthma Brother   . Diabetes Maternal Uncle     Social History   Socioeconomic History  . Marital status: Single    Spouse name: Not on file  . Number of children: Not on file  . Years of education: Not on file  . Highest education level: Not on file  Occupational History  . Not on file  Tobacco Use  . Smoking status: Current Every Day Smoker    Packs/day: 0.25    Years: 9.00    Pack years: 2.25    Types: Cigarettes  . Smokeless tobacco: Never Used  Vaping Use  . Vaping Use: Never used  Substance and Sexual Activity  . Alcohol use: Not Currently    Alcohol/week: 14.0 standard drinks    Types: 14  Cans of beer per week    Comment: 24 oz of beer per day; before pregnancy  . Drug use: Not Currently    Types: Marijuana  . Sexual activity: Not Currently    Birth control/protection: Injection  Other Topics Concern  . Not on file  Social History Narrative  . Not on file   Social Determinants of Health   Financial Resource Strain: Not on file  Food Insecurity: Not on file  Transportation Needs: Not on file  Physical Activity: Not on file  Stress: Not on file  Social Connections: Not on file  Intimate Partner Violence: Not on file    Outpatient Medications Prior to Visit   Medication Sig Dispense Refill  . dexamethasone (DECADRON) 2 MG tablet Take 6 mg by mouth daily.    . hydrOXYzine (ATARAX/VISTARIL) 10 MG tablet Take 1 tablet (10 mg total) by mouth 3 (three) times daily as needed. 30 tablet 0  . medroxyPROGESTERone (DEPO-PROVERA) 150 MG/ML injection Inject 1 mL (150 mg total) into the muscle every 3 (three) months. 1 mL 3  . busPIRone (BUSPAR) 5 MG tablet Take 1 tablet (5 mg total) by mouth 3 (three) times daily. 90 tablet 0  . ibuprofen (ADVIL) 400 MG tablet Take by mouth.     No facility-administered medications prior to visit.    No Known Allergies  Review of Systems  Gastrointestinal: Negative for bowel incontinence.  Genitourinary: Negative for pelvic pain.  Musculoskeletal: Positive for back pain.  Neurological: Negative for tingling, numbness and headaches.  Psychiatric/Behavioral: Positive for sleep disturbance. Negative for self-injury and suicidal ideas. The patient is nervous/anxious.        Sleep paralysis  All other systems reviewed and are negative.      Objective:    Physical Exam Vitals reviewed.  Constitutional:      General: She is awake.     Appearance: She is well-developed, well-groomed and overweight.     Interventions: Face mask in place.  HENT:     Head: Normocephalic.     Nose: Nose normal.  Eyes:     Conjunctiva/sclera: Conjunctivae normal.  Cardiovascular:     Rate and Rhythm: Normal rate and regular rhythm.     Pulses: Normal pulses.     Heart sounds: Normal heart sounds.  Pulmonary:     Effort: Pulmonary effort is normal.     Breath sounds: Normal breath sounds.  Abdominal:     General: Bowel sounds are normal.  Musculoskeletal:        General: Tenderness present.  Neurological:     Mental Status: She is alert and oriented to person, place, and time.  Psychiatric:        Behavior: Behavior is cooperative.     Comments: Positive for depression and anxiety.     BP 109/71   Pulse 82   Temp (!) 97.1  F (36.2 C) (Temporal)   Ht 5\' 8"  (1.727 m)   Wt 218 lb 12.8 oz (99.2 kg)   SpO2 98%   BMI 33.27 kg/m  Wt Readings from Last 3 Encounters:  03/29/20 218 lb 12.8 oz (99.2 kg)  02/23/20 218 lb (98.9 kg)  01/29/20 215 lb 9.6 oz (97.8 kg)    Health Maintenance Due  Topic Date Due  . COVID-19 Vaccine (1) Never done    There are no preventive care reminders to display for this patient.   Lab Results  Component Value Date   TSH 2.650 05/06/2018   Lab Results  Component Value Date   WBC 7.5 12/14/2018   HGB 11.6 (L) 12/14/2018   HCT 34.9 (L) 12/14/2018   MCV 94.6 12/14/2018   PLT 301 12/14/2018   Lab Results  Component Value Date   NA 137 12/06/2018   K 3.8 12/06/2018   CO2 20 12/06/2018   GLUCOSE 112 (H) 12/06/2018   BUN 10 12/06/2018   CREATININE 0.63 12/06/2018   BILITOT <0.2 12/06/2018   ALKPHOS 141 (H) 12/06/2018   AST 16 12/06/2018   ALT 11 12/06/2018   PROT 6.2 12/06/2018   ALBUMIN 3.7 (L) 12/06/2018   CALCIUM 9.4 12/06/2018   ANIONGAP 12 06/13/2018       Assessment & Plan:   Problem List Items Addressed This Visit      Other   Depression with anxiety    Depression symptoms not well controlled.  Started patient on Lexapro 10 mg tablet once daily follow-up in 4 to 6 weeks.  Medication sent to pharmacy.      Relevant Medications   escitalopram (LEXAPRO) 10 MG tablet   Anxiety    Anxiety not well controlled on current medication.  Patient reports not liking how medication made her feel.  Discontinue BuSpar.  Started patient on Lexapro 10 mg tablet daily by mouth follow-up in 4 to 6 weeks.  Rx sent to pharmacy        Relevant Medications   escitalopram (LEXAPRO) 10 MG tablet   Acute bilateral low back pain without sciatica - Primary    Bilateral lower back pain without sciatica is new for patient.  Symptoms are not well controlled.  Provided education to patient with printed handouts given.  Advised patient to start back strengthening exercises.   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  Patient has since stopped the job but continues to have lower back pain.  Started patient on naproxen 500 mg tablet by mouth for 2 weeks, Flexeril as needed, patient rates pain 7 out of 10 from pain scale of 0-10.  Follow-up with worsening unresolved symptoms. Rx sent to pharmacy.      Relevant Medications   escitalopram (LEXAPRO) 10 MG tablet   naproxen (NAPROSYN) 500 MG tablet   cyclobenzaprine (FLEXERIL) 5 MG tablet       Meds ordered this encounter  Medications  . escitalopram (LEXAPRO) 10 MG tablet    Sig: Take 1 tablet (10 mg total) by mouth daily.    Dispense:  60 tablet    Refill:  0    Order Specific Question:   Supervising Provider    Answer:   Raliegh Ip [[id number]]  . naproxen (NAPROSYN) 500 MG tablet    Sig: Take 1 tablet (500 mg total) by mouth 2 (two) times daily with a meal.    Dispense:  30 tablet    Refill:  0    Order Specific Question:   Supervising Provider    Answer:   Raliegh Ip [[id number]]  . cyclobenzaprine (FLEXERIL) 5 MG tablet    Sig: Take 1 tablet (5 mg total) by mouth 3 (three) times daily as needed for muscle spasms.    Dispense:  30 tablet    Refill:  1    Order Specific Question:   Supervising Provider    Answer:   Raliegh Ip [[id number]]     Daryll Drown, NP

## 2020-04-27 ENCOUNTER — Ambulatory Visit (INDEPENDENT_AMBULATORY_CARE_PROVIDER_SITE_OTHER): Payer: Medicaid Other | Admitting: *Deleted

## 2020-04-27 ENCOUNTER — Other Ambulatory Visit: Payer: Self-pay

## 2020-04-27 DIAGNOSIS — Z3042 Encounter for surveillance of injectable contraceptive: Secondary | ICD-10-CM | POA: Diagnosis not present

## 2020-04-27 MED ORDER — MEDROXYPROGESTERONE ACETATE 150 MG/ML IM SUSP
150.0000 mg | Freq: Once | INTRAMUSCULAR | Status: AC
  Administered 2020-04-27: 150 mg via INTRAMUSCULAR

## 2020-04-27 NOTE — Progress Notes (Signed)
   NURSE VISIT- INJECTION  SUBJECTIVE:  Tara Colon is a 30 y.o. G28P1102 female here for a Depo Provera for contraception/period management. She is a GYN patient.   OBJECTIVE:  There were no vitals taken for this visit.  Appears well, in no apparent distress  Injection administered in: Right deltoid  Meds ordered this encounter  Medications  . medroxyPROGESTERone (DEPO-PROVERA) injection 150 mg    ASSESSMENT: GYN patient Depo Provera for contraception/period management PLAN: Follow-up: in 11-13 weeks for next Depo   Annamarie Dawley  04/27/2020 9:02 AM

## 2020-05-03 ENCOUNTER — Ambulatory Visit: Payer: Medicaid Other | Admitting: Nurse Practitioner

## 2020-05-03 ENCOUNTER — Other Ambulatory Visit: Payer: Self-pay

## 2020-05-03 ENCOUNTER — Encounter: Payer: Self-pay | Admitting: Nurse Practitioner

## 2020-05-03 VITALS — BP 114/65 | HR 90 | Temp 97.4°F | Ht 68.0 in | Wt 214.0 lb

## 2020-05-03 DIAGNOSIS — Z716 Tobacco abuse counseling: Secondary | ICD-10-CM | POA: Diagnosis not present

## 2020-05-03 DIAGNOSIS — F418 Other specified anxiety disorders: Secondary | ICD-10-CM

## 2020-05-03 DIAGNOSIS — F419 Anxiety disorder, unspecified: Secondary | ICD-10-CM | POA: Diagnosis not present

## 2020-05-03 MED ORDER — NICOTINE 21 MG/24HR TD PT24: 21.0000 mg | MEDICATED_PATCH | Freq: Every day | TRANSDERMAL | 0 refills | Status: DC

## 2020-05-03 NOTE — Assessment & Plan Note (Signed)
Anxiety gradually improving.  No changes to current medication.  Follow-up in 3 months

## 2020-05-03 NOTE — Assessment & Plan Note (Signed)
Completed smoking cessation counseling.  Education provided printed handouts given.  Patient is willing to start nicotine patch and follow-up in 3 months.

## 2020-05-03 NOTE — Progress Notes (Signed)
New Patient Office Visit  Subjective:  Patient ID: Tara Colon, female    DOB: [birth year]  Age: 30 y.o. MRN: [id number]  CC:  Chief Complaint  Patient presents with  . Follow-up    mood    HPI Tara Colon presents for Depression: Patient complains of depression. She complains of depressed mood. Onset was approximately a few months ago, gradually improving since that time.  She denies current suicidal and homicidal plan or intent.   Family history significant for bipolar.Possible organic causes contributing are: none.  Risk factors: positive family history in  father Previous treatment includes Lexapro . She complains of the following side effects from the treatment: none.  Anxiety: Patient complains of anxiety disorder.  She has the following symptoms: difficulty concentrating, irritable, palpitations. Onset of symptoms was approximately a few months ago, gradually improving since that time. She denies current suicidal and homicidal ideation. Family history significant for bipolar, anxiety, depression.Possible organic causes contributing are: none. Risk factors: positive family history in  father Previous treatment includes Lexapro .  She complains of the following side effects from the treatment: none.   Past Medical History:  Diagnosis Date  . Anxiety   . Bipolar affect, depressed (HCC)   . BV (bacterial vaginosis)   . Depression   . HSV (herpes simplex virus) infection    pt reports no outbreaks  . Insomnia     Past Surgical History:  Procedure Laterality Date  . NO PAST SURGERIES      Family History  Problem Relation Age of Onset  . Anxiety disorder Father   . Depression Father   . Bipolar disorder Father   . Diabetes Maternal Grandmother   . Heart failure Maternal Grandmother   . Drug abuse Paternal Aunt   . Alcohol abuse Paternal Aunt   . Drug abuse Paternal Uncle   . Alcohol abuse Paternal Uncle   . Alcohol abuse Cousin   . Drug abuse Cousin   . Asthma  Brother   . Diabetes Maternal Uncle     Social History   Socioeconomic History  . Marital status: Single    Spouse name: Not on file  . Number of children: Not on file  . Years of education: Not on file  . Highest education level: Not on file  Occupational History  . Not on file  Tobacco Use  . Smoking status: Current Every Day Smoker    Packs/day: 0.25    Years: 9.00    Pack years: 2.25    Types: Cigarettes  . Smokeless tobacco: Never Used  Vaping Use  . Vaping Use: Never used  Substance and Sexual Activity  . Alcohol use: Not Currently    Alcohol/week: 14.0 standard drinks    Types: 14 Cans of beer per week    Comment: 24 oz of beer per day; before pregnancy  . Drug use: Not Currently    Types: Marijuana  . Sexual activity: Not Currently    Birth control/protection: Injection  Other Topics Concern  . Not on file  Social History Narrative  . Not on file   Social Determinants of Health   Financial Resource Strain: Not on file  Food Insecurity: Not on file  Transportation Needs: Not on file  Physical Activity: Not on file  Stress: Not on file  Social Connections: Not on file  Intimate Partner Violence: Not on file   Flowsheet Row Office Visit from 05/03/2020 in [facility]  PHQ-9 Total Score 7  GAD 7 : Generalized Anxiety Score 05/03/2020 03/29/2020 02/23/2020  Nervous, Anxious, on Edge 0 3 3  Control/stop worrying 0 3 3  Worry too much - different things 0 3 3  Trouble relaxing 0 3 2  Restless 0 2 2  Easily annoyed or irritable 0 3 3  Afraid - awful might happen 0 1 1  Total GAD 7 Score 0 18 17  Anxiety Difficulty Not difficult at all Not difficult at all Somewhat difficult     ROS Review of Systems  Constitutional: Negative.   HENT: Negative.   Eyes: Negative.   Cardiovascular: Negative.   Gastrointestinal: Negative.   Endocrine: Negative.   Musculoskeletal: Negative.   Psychiatric/Behavioral: The patient is  nervous/anxious.   All other systems reviewed and are negative.   Objective:   Today's Vitals: BP 114/65   Pulse 90   Temp (!) 97.4 F (36.3 C)   Ht 5\' 8"  (1.727 m)   Wt 214 lb (97.1 kg)   SpO2 98%   BMI 32.54 kg/m   Physical Exam Vitals reviewed.  Constitutional:      Appearance: Normal appearance.  HENT:     Head: Normocephalic.     Nose: Nose normal.  Eyes:     Conjunctiva/sclera: Conjunctivae normal.  Cardiovascular:     Rate and Rhythm: Normal rate.     Pulses: Normal pulses.     Heart sounds: Normal heart sounds.  Pulmonary:     Effort: Pulmonary effort is normal.  Abdominal:     General: Bowel sounds are normal.  Musculoskeletal:        General: Normal range of motion.     Cervical back: Normal range of motion.  Skin:    General: Skin is warm.  Neurological:     Mental Status: She is alert and oriented to person, place, and time.  Psychiatric:     Comments: Anxiety/depression     Assessment & Plan:   Problem List Items Addressed This Visit      Other   Encounter for smoking cessation counseling    Completed smoking cessation counseling.  Education provided printed handouts given.  Patient is willing to start nicotine patch and follow-up in 3 months.      Relevant Medications   nicotine (NICODERM CQ - DOSED IN MG/24 HOURS) 21 mg/24hr patch   Depression with anxiety    Symptoms well controlled on current medication. Lexapro 10 mg tablet daily      Anxiety - Primary    Anxiety gradually improving.  No changes to current medication.  Follow-up in 3 months         Outpatient Encounter Medications as of 05/03/2020  Medication Sig  . cyclobenzaprine (FLEXERIL) 5 MG tablet Take 1 tablet (5 mg total) by mouth 3 (three) times daily as needed for muscle spasms.  05/05/2020 dexamethasone (DECADRON) 2 MG tablet Take 6 mg by mouth daily.  Marland Kitchen escitalopram (LEXAPRO) 10 MG tablet Take 1 tablet (10 mg total) by mouth daily.  . hydrOXYzine (ATARAX/VISTARIL) 10 MG  tablet Take 1 tablet (10 mg total) by mouth 3 (three) times daily as needed.  . medroxyPROGESTERone (DEPO-PROVERA) 150 MG/ML injection Inject 1 mL (150 mg total) into the muscle every 3 (three) months.  . naproxen (NAPROSYN) 500 MG tablet Take 1 tablet (500 mg total) by mouth 2 (two) times daily with a meal.   No facility-administered encounter medications on file as of 05/03/2020.    Follow-up: Return in about 3 months (around 07/31/2020).  Dashonna Chagnon M Marvelous Bouwens, NP 

## 2020-05-03 NOTE — Patient Instructions (Signed)
Patient's symptoms well controlled, follow up in 3 months    [email or web address]  Generalized Anxiety Disorder, Adult Generalized anxiety disorder (GAD) is a mental health condition. Unlike normal worries, anxiety related to GAD is not triggered by a specific event. These worries do not fade or get better with time. GAD interferes with relationships, work, and school. GAD symptoms can vary from mild to severe. People with severe GAD can have intense waves of anxiety with physical symptoms that are similar to panic attacks. What are the causes? The exact cause of GAD is not known, but the following are believed to have an impact:  Differences in natural brain chemicals.  Genes passed down from parents to children.  Differences in the way threats are perceived.  Development during childhood.  Personality. What increases the risk? The following factors may make you more likely to develop this condition:  Being female.  Having a family history of anxiety disorders.  Being very shy.  Experiencing very stressful life events, such as the death of a loved one.  Having a very stressful family environment. What are the signs or symptoms? People with GAD often worry excessively about many things in their lives, such as their health and family. Symptoms may also include:  Mental and emotional symptoms: ? Worrying excessively about natural disasters. ? Fear of being late. ? Difficulty concentrating. ? Fears that others are judging your performance.  Physical symptoms: ? Fatigue. ? Headaches, muscle tension, muscle twitches, trembling, or feeling shaky. ? Feeling like your heart is pounding or beating very fast. ? Feeling out of breath or like you cannot take a deep breath. ? Having trouble falling asleep or staying asleep, or experiencing restlessness. ? Sweating. ? Nausea, diarrhea, or irritable bowel syndrome (IBS).  Behavioral symptoms: ? Experiencing erratic moods or  irritability. ? Avoidance of new situations. ? Avoidance of people. ? Extreme difficulty making decisions. How is this diagnosed? This condition is diagnosed based on your symptoms and medical history. You will also have a physical exam. Your health care provider may perform tests to rule out other possible causes of your symptoms. To be diagnosed with GAD, a person must have anxiety that:  Is out of his or her control.  Affects several different aspects of his or her life, such as work and relationships.  Causes distress that makes him or her unable to take part in normal activities.  Includes at least three symptoms of GAD, such as restlessness, fatigue, trouble concentrating, irritability, muscle tension, or sleep problems. Before your health care provider can confirm a diagnosis of GAD, these symptoms must be present more days than they are not, and they must last for 6 months or longer. How is this treated? This condition may be treated with:  Medicine. Antidepressant medicine is usually prescribed for long-term daily control. Anti-anxiety medicines may be added in severe cases, especially when panic attacks occur.  Talk therapy (psychotherapy). Certain types of talk therapy can be helpful in treating GAD by providing support, education, and guidance. Options include: ? Cognitive behavioral therapy (CBT). People learn coping skills and self-calming techniques to ease their physical symptoms. They learn to identify unrealistic thoughts and behaviors and to replace them with more appropriate thoughts and behaviors. ? Acceptance and commitment therapy (ACT). This treatment teaches people how to be mindful as a way to cope with unwanted thoughts and feelings. ? Biofeedback. This process trains you to manage your body's response (physiological response) through breathing techniques and relaxation methods. You will  work with a Paramedic while machines are used to monitor your physical  symptoms.  Stress management techniques. These include yoga, meditation, and exercise. A mental health specialist can help determine which treatment is best for you. Some people see improvement with one type of therapy. However, other people require a combination of therapies.   Follow these instructions at home: Lifestyle  Maintain a consistent routine and schedule.  Anticipate stressful situations. Create a plan, and allow extra time to work with your plan.  Practice stress management or self-calming techniques that you have learned from your therapist or your health care provider. General instructions  Take over-the-counter and prescription medicines only as told by your health care provider.  Understand that you are likely to have setbacks. Accept this and be kind to yourself as you persist to take better care of yourself.  Recognize and accept your accomplishments, even if you judge them as small.  Keep all follow-up visits as told by your health care provider. This is important. Contact a health care provider if:  Your symptoms do not get better.  Your symptoms get worse.  You have signs of depression, such as: ? A persistently sad or irritable mood. ? Loss of enjoyment in activities that used to bring you joy. ? Change in weight or eating. ? Changes in sleeping habits. ? Avoiding friends or family members. ? Loss of energy for normal tasks. ? Feelings of guilt or worthlessness. Get help right away if:  You have serious thoughts about hurting yourself or others. If you ever feel like you may hurt yourself or others, or have thoughts about taking your own life, get help right away. Go to your nearest emergency department or:  Call your local emergency services (911 in the [place]  Call a suicide crisis helpline, such as the National Suicide Prevention Lifeline at 7317074347. This is open 24 hours a day in the [place]  Text the Crisis Text Line at (309) 246-0163 (in the  [place] Summary  Generalized anxiety disorder (GAD) is a mental health condition that involves worry that is not triggered by a specific event.  People with GAD often worry excessively about many things in their lives, such as their health and family.  GAD may cause symptoms such as restlessness, trouble concentrating, sleep problems, frequent sweating, nausea, diarrhea, headaches, and trembling or muscle twitching.  A mental health specialist can help determine which treatment is best for you. Some people see improvement with one type of therapy. However, other people require a combination of therapies. This information is not intended to replace advice given to you by your health care provider. Make sure you discuss any questions you have with your health care provider. Document Revised: 12/25/2018 Document Reviewed: 12/25/2018 Elsevier Patient Education  2021 [employer] Inc. Major Depressive Disorder, Adult Major depressive disorder is a mental health condition. This disorder affects feelings. It can also affect the body. Symptoms of this condition last most of the day, almost every day, for 2 weeks. This disorder can affect:  Relationships.  Daily activities, such as work and school.  Activities that you normally like to do. What are the causes? The cause of this condition is not known. The disorder is likely caused by a mix of things, including:  Your personality, such as being a shy person.  Your behavior, or how you act toward others.  Your thoughts and feelings.  Too much alcohol or drugs.  How you react to stress.  Health and mental problems that you  have had for a long time.  Things that hurt you in the past (trauma).  Big changes in your life, such as divorce. What increases the risk? The following factors may make you more likely to develop this condition:  Having family members with depression.  Being a woman.  Problems in the family.  Low levels of some  brain chemicals.  Things that caused you pain as a child, especially if you lost a parent or were abused.  A lot of stress in your life, such as from: ? Living without basic needs of life, such as food and shelter. ? Being treated poorly because of race, sex, or religion (discrimination).  Health and mental problems that you have had for a long time. What are the signs or symptoms? The main symptoms of this condition are:  Being sad all the time.  Being grouchy all the time.  Loss of interest in things and activities. Other symptoms include:  Sleeping too much or too little.  Eating too much or too little.  Gaining or losing weight, without knowing why.  Feeling tired or having low energy.  Being restless and weak.  Feeling hopeless, worthless, or guilty.  Trouble thinking clearly or making decisions.  Thoughts of hurting yourself or others, or thoughts of ending your life.  Spending a lot of time alone.  Inability to complete common tasks of daily life. If you have very bad MDD, you may:  Believe things that are not true.  Hear, see, taste, or feel things that are not there.  Have mild depression that lasts for at least 2 years.  Feel very sad and hopeless.  Have trouble speaking or moving. How is this treated? This condition may be treated with:  Talk therapy. This teaches you to know bad thoughts, feelings, and actions and how to change them. ? This can also help you to communicate with others. ? This can be done with members of your family.  Medicines. These can be used to treat worry (anxiety), depression, or low levels of chemicals in the brain.  Lifestyle changes. You may need to: ? Limit alcohol use. ? Limit drug use. ? Get regular exercise. ? Get plenty of sleep. ? Make healthy eating choices. ? Spend more time outdoors.  Brain stimulation. This treatment excites the brain. This is done when symptoms are very bad or have not gotten better  with other treatments. Follow these instructions at home: Activity  Get regular exercise as told.  Spend time outdoors as told.  Make time to do the things you enjoy.  Find ways to deal with stress. Try to: ? Meditate. ? Do deep breathing. ? Spend time in nature. ? Keep a journal.  Return to your normal activities as told by your doctor. Ask your doctor what activities are safe for you. Alcohol and drug use  If you drink alcohol: ? Limit how much you use to:  0-1 drink a day for women.  0-2 drinks a day for men. ? Be aware of how much alcohol is in your drink. In the [place] one drink equals one 12 oz bottle of beer (355 mL), one 5 oz glass of wine (148 mL), or one 1 oz glass of hard liquor (44 mL).  Talk to your doctor about: ? Alcohol use. Alcohol can affect some medicines. ? Any drug use. General instructions  Take over-the-counter and prescription medicines and herbal preparations only as told by your doctor.  Eat a healthy diet.  Get a lot of sleep.  Think about joining a support group. Your doctor may be able to suggest one.  Keep all follow-up visits as told by your doctor. This is important.   Where to find more information:  [employer] on Mental Illness: www.nami.org  [place] [employer] of Mental Health: http://www.maynard.net/  American Psychiatric Association: www.psychiatry.org/patients-families/ Contact a doctor if:  Your symptoms get worse.  You get new symptoms. Get help right away if:  You hurt yourself.  You have serious thoughts about hurting yourself or others.  You see, hear, taste, smell, or feel things that are not there. If you ever feel like you may hurt yourself or others, or have thoughts about taking your own life, get help right away. Go to your nearest emergency department or:  Call your local emergency services (911 in the [place]  Call a suicide crisis helpline, such as the National Suicide Prevention Lifeline at  (347)755-0602. This is open 24 hours a day in the [place]  Text the Crisis Text Line at 331-050-1206 (in the [place] Summary  Major depressive disorder is a mental health condition. This disorder affects feelings. Symptoms of this condition last most of the day, almost every day, for 2 weeks.  The symptoms of this disorder can cause problems with relationships and with daily activities.  There are treatments and support for people who get this disorder. You may need more than one type of treatment.  Get help right away if you have serious thoughts about hurting yourself or others. This information is not intended to replace advice given to you by your health care provider. Make sure you discuss any questions you have with your health care provider. Document Revised: 02/15/2019 Document Reviewed: 02/15/2019 Elsevier Patient Education  2021 [employer].

## 2020-05-03 NOTE — Assessment & Plan Note (Signed)
Symptoms well controlled on current medication. Lexapro 10 mg tablet daily

## 2020-05-24 ENCOUNTER — Other Ambulatory Visit: Payer: Self-pay | Admitting: Nurse Practitioner

## 2020-05-24 DIAGNOSIS — M545 Low back pain, unspecified: Secondary | ICD-10-CM

## 2020-05-24 DIAGNOSIS — F418 Other specified anxiety disorders: Secondary | ICD-10-CM

## 2020-05-24 MED ORDER — ESCITALOPRAM OXALATE 10 MG PO TABS: 10.0000 mg | ORAL_TABLET | Freq: Every day | ORAL | 0 refills | Status: DC

## 2020-06-01 ENCOUNTER — Other Ambulatory Visit: Payer: Self-pay | Admitting: Family Medicine

## 2020-06-01 DIAGNOSIS — M545 Low back pain, unspecified: Secondary | ICD-10-CM

## 2020-06-08 ENCOUNTER — Other Ambulatory Visit: Payer: Self-pay

## 2020-06-08 ENCOUNTER — Encounter: Payer: Self-pay | Admitting: Nurse Practitioner

## 2020-06-08 ENCOUNTER — Ambulatory Visit: Payer: Medicaid Other | Admitting: Nurse Practitioner

## 2020-06-08 VITALS — BP 109/65 | HR 83 | Temp 97.6°F | Ht 68.0 in | Wt 220.1 lb

## 2020-06-08 DIAGNOSIS — R202 Paresthesia of skin: Secondary | ICD-10-CM | POA: Insufficient documentation

## 2020-06-08 NOTE — Progress Notes (Signed)
Acute Office Visit  Subjective:    Patient ID: Tara Colon, female    DOB: 1991/02/15, 30 y.o.   MRN: [id number]  Chief Complaint  Patient presents with  . Numbness    HPI Patient is in today for new onset bilateral lower extremity tingling sensation without pain, numbness or burning.  Patient is concerned that she has a family history of diabetes and would like to check to make sure that her blood sugars are not elevated.  Patient denies any signs or symptoms of hypo-/hyperglycemia.  No fever, nausea, dizziness or lethargy. Symptoms are intermittent but recently has become more often.  Past Medical History:  Diagnosis Date  . Anxiety   . Bipolar affect, depressed (HCC)   . BV (bacterial vaginosis)   . Depression   . HSV (herpes simplex virus) infection    pt reports no outbreaks  . Insomnia     Past Surgical History:  Procedure Laterality Date  . NO PAST SURGERIES      Family History  Problem Relation Age of Onset  . Anxiety disorder Father   . Depression Father   . Bipolar disorder Father   . Diabetes Maternal Grandmother   . Heart failure Maternal Grandmother   . Drug abuse Paternal Aunt   . Alcohol abuse Paternal Aunt   . Drug abuse Paternal Uncle   . Alcohol abuse Paternal Uncle   . Alcohol abuse Cousin   . Drug abuse Cousin   . Asthma Brother   . Diabetes Maternal Uncle     Social History   Socioeconomic History  . Marital status: Single    Spouse name: Not on file  . Number of children: Not on file  . Years of education: Not on file  . Highest education level: Not on file  Occupational History  . Not on file  Tobacco Use  . Smoking status: Current Every Day Smoker    Packs/day: 0.25    Years: 9.00    Pack years: 2.25    Types: Cigarettes  . Smokeless tobacco: Never Used  Vaping Use  . Vaping Use: Never used  Substance and Sexual Activity  . Alcohol use: Not Currently    Alcohol/week: 14.0 standard drinks    Types: 14 Cans of beer per  week    Comment: 24 oz of beer per day; before pregnancy  . Drug use: Not Currently    Types: Marijuana  . Sexual activity: Not Currently    Birth control/protection: Injection  Other Topics Concern  . Not on file  Social History Narrative  . Not on file   Social Determinants of Health   Financial Resource Strain: Not on file  Food Insecurity: Not on file  Transportation Needs: Not on file  Physical Activity: Not on file  Stress: Not on file  Social Connections: Not on file  Intimate Partner Violence: Not on file    Outpatient Medications Prior to Visit  Medication Sig Dispense Refill  . cyclobenzaprine (FLEXERIL) 5 MG tablet TAKE 1 TABLET THREE TIMES DAILY AS NEEDED FOR MUSCLE SPASM 30 tablet 0  . escitalopram (LEXAPRO) 10 MG tablet Take 1 tablet (10 mg total) by mouth daily. 60 tablet 0  . hydrOXYzine (ATARAX/VISTARIL) 10 MG tablet Take 1 tablet (10 mg total) by mouth 3 (three) times daily as needed. 30 tablet 0  . medroxyPROGESTERone (DEPO-PROVERA) 150 MG/ML injection Inject 1 mL (150 mg total) into the muscle every 3 (three) months. 1 mL 3  . nicotine (NICODERM  CQ - DOSED IN MG/24 HOURS) 21 mg/24hr patch Place 1 patch (21 mg total) onto the skin daily. 28 patch 0  . dexamethasone (DECADRON) 2 MG tablet Take 6 mg by mouth daily.    . naproxen (NAPROSYN) 500 MG tablet Take 1 tablet (500 mg total) by mouth 2 (two) times daily with a meal. 30 tablet 0   No facility-administered medications prior to visit.    No Known Allergies  Review of Systems  Constitutional: Negative.   HENT: Negative.   Eyes: Negative.   Respiratory: Negative.   Cardiovascular: Negative.   Gastrointestinal: Negative.   Skin: Negative.   Neurological: Negative for dizziness, light-headedness and headaches.       Paresthesia  All other systems reviewed and are negative.      Objective:    Physical Exam Vitals reviewed.  Constitutional:      Appearance: Normal appearance.  HENT:     Head:  Normocephalic.     Nose: Nose normal.  Eyes:     Conjunctiva/sclera: Conjunctivae normal.  Cardiovascular:     Pulses: Normal pulses.     Heart sounds: Normal heart sounds.  Pulmonary:     Effort: Pulmonary effort is normal.     Breath sounds: Normal breath sounds.  Abdominal:     General: Bowel sounds are normal.  Musculoskeletal:        General: Normal range of motion.     Cervical back: Normal range of motion.     Right lower leg: No edema.     Left lower leg: No edema.  Neurological:     Mental Status: She is alert and oriented to person, place, and time.     Motor: No weakness.     Comments: Positive for paresthesia  Psychiatric:        Mood and Affect: Mood normal.        Behavior: Behavior normal.     BP 109/65   Pulse 83   Temp 97.6 F (36.4 C) (Temporal)   Ht 5\' 8"  (1.727 m)   Wt 220 lb 2 oz (99.8 kg)   BMI 33.47 kg/m  Wt Readings from Last 3 Encounters:  06/08/20 220 lb 2 oz (99.8 kg)  05/03/20 214 lb (97.1 kg)  03/29/20 218 lb 12.8 oz (99.2 kg)    Health Maintenance Due  Topic Date Due  . COVID-19 Vaccine (1) Never done    There are no preventive care reminders to display for this patient.   Lab Results  Component Value Date   TSH 2.650 05/06/2018   Lab Results  Component Value Date   WBC 7.5 12/14/2018   HGB 11.6 (L) 12/14/2018   HCT 34.9 (L) 12/14/2018   MCV 94.6 12/14/2018   PLT 301 12/14/2018   Lab Results  Component Value Date   NA 137 12/06/2018   K 3.8 12/06/2018   CO2 20 12/06/2018   GLUCOSE 112 (H) 12/06/2018   BUN 10 12/06/2018   CREATININE 0.63 12/06/2018   BILITOT <0.2 12/06/2018   ALKPHOS 141 (H) 12/06/2018   AST 16 12/06/2018   ALT 11 12/06/2018   PROT 6.2 12/06/2018   ALBUMIN 3.7 (L) 12/06/2018   CALCIUM 9.4 12/06/2018   ANIONGAP 12 06/13/2018       Assessment & Plan:   Problem List Items Addressed This Visit      Other   Paresthesia of both legs - Primary    New symptoms of paresthesia bilateral lower  extremities.  Symptoms are  not well controlled and are intermittent but with more frequent occurrences in the last few weeks. Completed CBC, CMP, B12 and folate.  Results pending  Education provided to patient with printed handouts given.  Patient knows to follow-up with worsening symptoms.         Relevant Orders   B12 and Folate Panel   CBC with Differential   Comprehensive metabolic panel       No orders of the defined types were placed in this encounter.    Daryll Drown, NP

## 2020-06-08 NOTE — Assessment & Plan Note (Signed)
New symptoms of paresthesia bilateral lower extremities.  Symptoms are not well controlled and are intermittent but with more frequent occurrences in the last few weeks. Completed CBC, CMP, B12 and folate.  Results pending  Education provided to patient with printed handouts given.  Patient knows to follow-up with worsening symptoms.

## 2020-06-08 NOTE — Patient Instructions (Signed)
Paresthesia Paresthesia is a burning or prickling feeling. This feeling can happen in any part of the body. It often happens in the hands, arms, legs, or feet. Usually, it is not painful. In most cases, the feeling goes away in a short time and is not a sign of a serious problem. If you have paresthesia that lasts a long time, you need to be seen by your doctor. Follow these instructions at home: Alcohol use  Do not drink alcohol if: ? Your doctor tells you not to drink. ? You are pregnant, may be pregnant, or are planning to become pregnant.  If you drink alcohol: ? Limit how much you use to:  0-1 drink a day for women.  0-2 drinks a day for men. ? Be aware of how much alcohol is in your drink. In the [place] one drink equals one 12 oz bottle of beer (355 mL), one 5 oz glass of wine (148 mL), or one 1 oz glass of hard liquor (44 mL).   Nutrition  Eat a healthy diet. This includes: ? Eating foods that have a lot of fiber in them, such as fresh fruits and vegetables, whole grains, and beans. ? Limiting foods that have a lot of fat and processed sugars in them, such as fried or sweet foods.   General instructions  Take over-the-counter and prescription medicines only as told by your doctor.  Do not use any products that have nicotine or tobacco in them, such as cigarettes and e-cigarettes. If you need help quitting, ask your doctor.  If you have diabetes, work with your doctor to make sure your blood sugar stays in a healthy range.  If your feet feel numb: ? Check for redness, warmth, and swelling every day. ? Wear padded socks and comfortable shoes. These help protect your feet.  Keep all follow-up visits as told by your doctor. This is important. Contact a doctor if:  You have paresthesia that gets worse or does not go away.  Lose feeling (have numbness) after an injury.  Your burning or prickling feeling gets worse when you walk.  You have pain or cramps.  You feel dizzy  or pass out (faint).  You have a rash. Get help right away if you:  Feel weak or have new weakness in an arm or leg.  Have trouble walking or moving.  Have problems speaking, understanding, or seeing.  Feel confused.  Cannot control when you pee (urinate) or poop (have a bowel movement). Summary  Paresthesia is a burning or prickling feeling. It often happens in the hands, arms, legs, or feet.  In most cases, the feeling goes away in a short time and is not a sign of a serious problem.  If you have paresthesia that lasts a long time, you need to be seen by your doctor. This information is not intended to replace advice given to you by your health care provider. Make sure you discuss any questions you have with your health care provider. Document Revised: 12/16/2019 Document Reviewed: 12/16/2019 Elsevier Patient Education  2021 Elsevier Inc.  

## 2020-06-09 LAB — COMPREHENSIVE METABOLIC PANEL
ALT: 17 IU/L (ref 0–32)
AST: 18 IU/L (ref 0–40)
Albumin/Globulin Ratio: 1.7 (ref 1.2–2.2)
Albumin: 4.5 g/dL (ref 3.9–5.0)
Alkaline Phosphatase: 109 IU/L (ref 44–121)
BUN/Creatinine Ratio: 19 (ref 9–23)
BUN: 15 mg/dL (ref 6–20)
Bilirubin Total: 0.3 mg/dL (ref 0.0–1.2)
CO2: 19 mmol/L — ABNORMAL LOW (ref 20–29)
Calcium: 9.8 mg/dL (ref 8.7–10.2)
Chloride: 104 mmol/L (ref 96–106)
Creatinine, Ser: 0.8 mg/dL (ref 0.57–1.00)
Globulin, Total: 2.6 g/dL (ref 1.5–4.5)
Glucose: 88 mg/dL (ref 65–99)
Potassium: 4 mmol/L (ref 3.5–5.2)
Sodium: 141 mmol/L (ref 134–144)
Total Protein: 7.1 g/dL (ref 6.0–8.5)
eGFR: 103 mL/min/{1.73_m2} (ref >59)

## 2020-06-09 LAB — CBC WITH DIFFERENTIAL/PLATELET
Basophils Absolute: 0 10*3/uL (ref 0.0–0.2)
Basos: 1 %
EOS (ABSOLUTE): 0.3 10*3/uL (ref 0.0–0.4)
Eos: 5 %
Hematocrit: 42.6 % (ref 34.0–46.6)
Hemoglobin: 14.2 g/dL (ref 11.1–15.9)
Immature Grans (Abs): 0 10*3/uL (ref 0.0–0.1)
Immature Granulocytes: 0 %
Lymphocytes Absolute: 2.3 10*3/uL (ref 0.7–3.1)
Lymphs: 39 %
MCH: 30.2 pg (ref 26.6–33.0)
MCHC: 33.3 g/dL (ref 31.5–35.7)
MCV: 91 fL (ref 79–97)
Monocytes Absolute: 0.7 10*3/uL (ref 0.1–0.9)
Monocytes: 12 %
Neutrophils Absolute: 2.6 10*3/uL (ref 1.4–7.0)
Neutrophils: 43 %
Platelets: 300 10*3/uL (ref 150–450)
RBC: 4.7 x10E6/uL (ref 3.77–5.28)
RDW: 12.1 % (ref 11.7–15.4)
WBC: 6 10*3/uL (ref 3.4–10.8)

## 2020-06-09 LAB — B12 AND FOLATE PANEL
Folate: 6.8 ng/mL (ref >3.0)
Vitamin B-12: 435 pg/mL (ref 232–1245)

## 2020-06-23 ENCOUNTER — Ambulatory Visit (INDEPENDENT_AMBULATORY_CARE_PROVIDER_SITE_OTHER): Payer: Medicaid Other | Admitting: Adult Health

## 2020-06-23 ENCOUNTER — Other Ambulatory Visit: Payer: Self-pay

## 2020-06-23 ENCOUNTER — Encounter: Payer: Self-pay | Admitting: Adult Health

## 2020-06-23 VITALS — BP 130/85 | HR 98 | Ht 68.0 in | Wt 224.0 lb

## 2020-06-23 DIAGNOSIS — L739 Follicular disorder, unspecified: Secondary | ICD-10-CM | POA: Insufficient documentation

## 2020-06-23 DIAGNOSIS — L0292 Furuncle, unspecified: Secondary | ICD-10-CM | POA: Insufficient documentation

## 2020-06-23 DIAGNOSIS — L0293 Carbuncle, unspecified: Secondary | ICD-10-CM | POA: Insufficient documentation

## 2020-06-23 MED ORDER — SULFAMETHOXAZOLE-TRIMETHOPRIM 800-160 MG PO TABS: 1.0000 | ORAL_TABLET | Freq: Two times a day (BID) | ORAL | 0 refills | Status: DC

## 2020-06-23 NOTE — Progress Notes (Signed)
  Subjective:     Patient ID: Tara Colon, female   DOB: 30-Dec-1990, 30 y.o.   MRN: [id number]  HPI Clydean is a 30 year old black female, single, G2P1102, in complaining of recurrent boils, ?hair bumps. Last pap 2019,normal. PCP is [place].  Review of Systems Recurrent boils,better now, ?hair bumps Reviewed past medical,surgical, social and family history. Reviewed medications and allergies.     Objective:   Physical Exam BP 130/85 (BP Location: Left Arm, Patient Position: Sitting, Cuff Size: Normal)   Pulse 98   Ht 5\' 8"  (1.727 m)   Wt 224 lb (101.6 kg)   BMI 34.06 kg/m  Skin warm and dry.Pelvic: external genitalia is normal in appearance no lesions, vagina: white discharge with odor,urethra has no lesions or masses noted, cervix:smooth and bulbous, uterus: normal size, shape and contour, non tender, no masses felt, adnexa: no masses or tenderness noted. Bladder is non tender and no masses felt.  Examination chaperoned by LPN.   [facility] - 06/23/20 08/23/20      Pregnancy Intention Screening   Does the patient want to become pregnant in the next year? No    Does the patient's partner want to become pregnant in the next year? No    Would the patient like to discuss contraceptive options today? No      Contraception Wrap Up   Current Method Hormonal Injection    End Method Hormonal Injection    Contraception Counseling Provided No             Assessment:     1. Folliculitis Clip hair, do not shave Try dial antibacterial soap Will rx septra ds Meds ordered this encounter  Medications  . sulfamethoxazole-trimethoprim (BACTRIM DS) 800-160 MG tablet    Sig: Take 1 tablet by mouth 2 (two) times daily. Take 1 bid    Dispense:  28 tablet    Refill:  0    Order Specific Question:   Supervising Provider    Answer:   [id number], LUTHER H [[id number]]    2. Recurrent boils     Plan:     Return in 4 weeks for pap and physical

## 2020-06-25 DIAGNOSIS — L659 Nonscarring hair loss, unspecified: Secondary | ICD-10-CM | POA: Diagnosis not present

## 2020-06-28 ENCOUNTER — Emergency Department (HOSPITAL_COMMUNITY): Payer: Medicaid Other

## 2020-06-28 ENCOUNTER — Other Ambulatory Visit: Payer: Self-pay

## 2020-06-28 ENCOUNTER — Emergency Department (HOSPITAL_COMMUNITY)
Admission: EM | Admit: 2020-06-28 | Discharge: 2020-06-28 | Disposition: A | Discharge status: Home / Self Care | Payer: Medicaid Other | Attending: Emergency Medicine | Admitting: Emergency Medicine

## 2020-06-28 DIAGNOSIS — R519 Headache, unspecified: Secondary | ICD-10-CM | POA: Diagnosis not present

## 2020-06-28 DIAGNOSIS — R059 Cough, unspecified: Secondary | ICD-10-CM | POA: Insufficient documentation

## 2020-06-28 DIAGNOSIS — Z5321 Procedure and treatment not carried out due to patient leaving prior to being seen by health care provider: Secondary | ICD-10-CM | POA: Insufficient documentation

## 2020-06-28 DIAGNOSIS — R0981 Nasal congestion: Secondary | ICD-10-CM | POA: Diagnosis not present

## 2020-06-28 DIAGNOSIS — R109 Unspecified abdominal pain: Secondary | ICD-10-CM | POA: Diagnosis not present

## 2020-06-28 DIAGNOSIS — R509 Fever, unspecified: Secondary | ICD-10-CM | POA: Insufficient documentation

## 2020-06-28 DIAGNOSIS — H9209 Otalgia, unspecified ear: Secondary | ICD-10-CM | POA: Insufficient documentation

## 2020-06-28 LAB — URINALYSIS, ROUTINE W REFLEX MICROSCOPIC
Bilirubin Urine: NEGATIVE
Glucose, UA: NEGATIVE mg/dL
Hgb urine dipstick: NEGATIVE
Ketones, ur: 5 mg/dL — AB
Leukocytes,Ua: NEGATIVE
Nitrite: NEGATIVE
Protein, ur: 30 mg/dL — AB
Specific Gravity, Urine: 1.025 (ref 1.005–1.030)
pH: 5 (ref 5.0–8.0)

## 2020-06-28 LAB — CBC WITH DIFFERENTIAL/PLATELET
Abs Immature Granulocytes: 0.02 10*3/uL (ref 0.00–0.07)
Basophils Absolute: 0 10*3/uL (ref 0.0–0.1)
Basophils Relative: 0 %
Eosinophils Absolute: 0.3 10*3/uL (ref 0.0–0.5)
Eosinophils Relative: 6 %
HCT: 41.5 % (ref 36.0–46.0)
Hemoglobin: 13.9 g/dL (ref 12.0–15.0)
Immature Granulocytes: 1 %
Lymphocytes Relative: 6 %
Lymphs Abs: 0.3 10*3/uL — ABNORMAL LOW (ref 0.7–4.0)
MCH: 30.2 pg (ref 26.0–34.0)
MCHC: 33.5 g/dL (ref 30.0–36.0)
MCV: 90.2 fL (ref 80.0–100.0)
Monocytes Absolute: 0.5 10*3/uL (ref 0.1–1.0)
Monocytes Relative: 13 %
Neutro Abs: 2.9 10*3/uL (ref 1.7–7.7)
Neutrophils Relative %: 74 %
Platelets: 281 10*3/uL (ref 150–400)
RBC: 4.6 MIL/uL (ref 3.87–5.11)
RDW: 13.2 % (ref 11.5–15.5)
WBC: 4 10*3/uL (ref 4.0–10.5)
nRBC: 0 % (ref 0.0–0.2)

## 2020-06-28 LAB — COMPREHENSIVE METABOLIC PANEL
ALT: 22 U/L (ref 0–44)
AST: 22 U/L (ref 15–41)
Albumin: 3.7 g/dL (ref 3.5–5.0)
Alkaline Phosphatase: 93 U/L (ref 38–126)
Anion gap: 8 (ref 5–15)
BUN: 8 mg/dL (ref 6–20)
CO2: 21 mmol/L — ABNORMAL LOW (ref 22–32)
Calcium: 8.9 mg/dL (ref 8.9–10.3)
Chloride: 104 mmol/L (ref 98–111)
Creatinine, Ser: 0.88 mg/dL (ref 0.44–1.00)
GFR, Estimated: >60 mL/min (ref >60)
Glucose, Bld: 98 mg/dL (ref 70–99)
Potassium: 3.6 mmol/L (ref 3.5–5.1)
Sodium: 133 mmol/L — ABNORMAL LOW (ref 135–145)
Total Bilirubin: 0.5 mg/dL (ref 0.3–1.2)
Total Protein: 7.1 g/dL (ref 6.5–8.1)

## 2020-06-28 LAB — LACTIC ACID, PLASMA: Lactic Acid, Venous: 1.2 mmol/L (ref 0.5–1.9)

## 2020-06-28 LAB — LIPASE, BLOOD: Lipase: 29 U/L (ref 11–51)

## 2020-06-28 LAB — I-STAT BETA HCG BLOOD, ED (MC, WL, AP ONLY): I-stat hCG, quantitative: <5 m[IU]/mL (ref <5)

## 2020-06-28 MED ORDER — ACETAMINOPHEN 325 MG PO TABS
650.0000 mg | ORAL_TABLET | Freq: Once | ORAL | Status: AC
  Administered 2020-06-28: 650 mg via ORAL
  Filled 2020-06-28: qty 2

## 2020-06-28 NOTE — ED Notes (Signed)
Called 3X for vs no answer.

## 2020-06-28 NOTE — ED Notes (Signed)
No answer for repeat VS

## 2020-06-28 NOTE — ED Triage Notes (Signed)
Patient c/o headache, earache, cough and congestion for a week. Stated she is concern about her kidneys d/t diffused abdominal pain

## 2020-06-28 NOTE — ED Triage Notes (Signed)
Emergency Medicine Provider Triage Evaluation Note  Tara Colon , Colon 30 y.o. female  was evaluated in triage.  Pt complains of multiple complaints.  Patient with headache, earache, cough, congestion x1 week.  States 2 days ago she developed fever.  She has bilateral pain.  No dysuria, hematuria.  Her children were sick last week they were tested for Covid and flu which were negative.  She was not tested.  Review of Systems  Positive: Headache, earache, cough, congestion, abdominal pain, flank pain Negative: Pain, shortness of breath, dysuria, hematuria, vaginal discharge.  Physical Exam  There were no vitals taken for this visit. Gen:   Awake, no distress   HEENT:  Atraumatic  Resp:  Normal effort  Cardiac:  Normal rate  Abd:   Nondistended, diffuse tenderness, bilateral flank pain, left greater than right. MSK:   Moves extremities without difficulty Neuro:  Speech clear   Medical Decision Making  Medically screening exam initiated at 1:24 PM.  Appropriate orders placed.  Tara Colon was informed that the remainder of the evaluation will be completed by another provider, this initial triage assessment does not replace that evaluation, and the importance of remaining in the ED until their evaluation is complete.  Clinical Impression  Headache, cough, congestion, abdominal pain, fever   Tara Dambrosio A, PA-C 06/28/20 1328

## 2020-06-29 ENCOUNTER — Encounter: Payer: Self-pay | Admitting: Family Medicine

## 2020-06-29 ENCOUNTER — Ambulatory Visit (INDEPENDENT_AMBULATORY_CARE_PROVIDER_SITE_OTHER): Payer: Medicaid Other | Admitting: Family Medicine

## 2020-06-29 VITALS — BP 122/80 | HR 110

## 2020-06-29 DIAGNOSIS — J069 Acute upper respiratory infection, unspecified: Secondary | ICD-10-CM

## 2020-06-29 MED ORDER — AMOXICILLIN 875 MG PO TABS: 875.0000 mg | ORAL_TABLET | Freq: Two times a day (BID) | ORAL | 0 refills | Status: DC

## 2020-06-29 NOTE — Progress Notes (Signed)
Acute Office Visit  Subjective:    Patient ID: Tara Colon, female    DOB: Nov 27, 1990, 30 y.o.   MRN: [id number]  Chief Complaint  Patient presents with  . URI    Congestion and fever 102  Nasal drainage and coughing. Has improved since last week. Was prescribed Bactrim but d/c because made her feel bad. Has HA     HPI Patient is in today for URI. Tara Colon reports URI symptoms that started 7 days ago. She felt like she had a slight cold with a fever. Her symptoms improved and then 2 days ago she started running a fever again. Reports max temp of 102. She continues to have head and chest congestion and productive cough. She has had a HA with pressure around her eyes and ears x 2 days as well. Some nausea and decreased appetitie. No vomiting. Denies shortness of breath or chest pain. She has taken tylenol for her symptoms. She was seen in the ED yesterday and had a normal CXR and labs that were unremarkable.She did not have a Covid test. She has not been vaccinated. She was taking Bactrim that was prescribed by her GYN for folliculitis when her symptoms started but stopped taking it due to side effects.   Past Medical History:  Diagnosis Date  . Anxiety   . Bipolar affect, depressed (HCC)   . BV (bacterial vaginosis)   . Depression   . HSV (herpes simplex virus) infection    pt reports no outbreaks  . Insomnia     Past Surgical History:  Procedure Laterality Date  . NO PAST SURGERIES      Family History  Problem Relation Age of Onset  . Anxiety disorder Father   . Depression Father   . Bipolar disorder Father   . Diabetes Maternal Grandmother   . Heart failure Maternal Grandmother   . Drug abuse Paternal Aunt   . Alcohol abuse Paternal Aunt   . Drug abuse Paternal Uncle   . Alcohol abuse Paternal Uncle   . Alcohol abuse Cousin   . Drug abuse Cousin   . Asthma Brother   . Diabetes Maternal Uncle     Social History   Socioeconomic History  . Marital status: Single     Spouse name: Not on file  . Number of children: Not on file  . Years of education: Not on file  . Highest education level: Not on file  Occupational History  . Not on file  Tobacco Use  . Smoking status: Current Every Day Smoker    Packs/day: 0.25    Years: 9.00    Pack years: 2.25    Types: Cigarettes  . Smokeless tobacco: Never Used  Vaping Use  . Vaping Use: Never used  Substance and Sexual Activity  . Alcohol use: Not Currently    Alcohol/week: 14.0 standard drinks    Types: 14 Cans of beer per week    Comment: 24 oz of beer per day; before pregnancy  . Drug use: Not Currently    Types: Marijuana  . Sexual activity: Not Currently    Birth control/protection: Injection  Other Topics Concern  . Not on file  Social History Narrative  . Not on file   Social Determinants of Health   Financial Resource Strain: Not on file  Food Insecurity: Not on file  Transportation Needs: Not on file  Physical Activity: Not on file  Stress: Not on file  Social Connections: Not on file  Intimate Partner  Violence: Not on file    Outpatient Medications Prior to Visit  Medication Sig Dispense Refill  . cyclobenzaprine (FLEXERIL) 5 MG tablet TAKE 1 TABLET THREE TIMES DAILY AS NEEDED FOR MUSCLE SPASM 30 tablet 0  . escitalopram (LEXAPRO) 10 MG tablet Take 1 tablet (10 mg total) by mouth daily. 60 tablet 0  . hydrOXYzine (ATARAX/VISTARIL) 10 MG tablet Take 1 tablet (10 mg total) by mouth 3 (three) times daily as needed. 30 tablet 0  . medroxyPROGESTERone (DEPO-PROVERA) 150 MG/ML injection Inject 1 mL (150 mg total) into the muscle every 3 (three) months. 1 mL 3  . nicotine (NICODERM CQ - DOSED IN MG/24 HOURS) 21 mg/24hr patch Place 1 patch (21 mg total) onto the skin daily. 28 patch 0  . sulfamethoxazole-trimethoprim (BACTRIM DS) 800-160 MG tablet Take 1 tablet by mouth 2 (two) times daily. Take 1 bid 28 tablet 0   No facility-administered medications prior to visit.    No Known  Allergies  Review of Systems As per HPI.     Objective:    Physical Exam Vitals and nursing note reviewed.  Constitutional:      Appearance: Normal appearance.  HENT:     Head:     Comments: Tenderness to frontal sinuses.     Right Ear: Tympanic membrane, ear canal and external ear normal.     Left Ear: Tympanic membrane, ear canal and external ear normal.     Nose: Congestion present.     Mouth/Throat:     Mouth: Mucous membranes are moist.     Pharynx: Oropharynx is clear. No oropharyngeal exudate or posterior oropharyngeal erythema.  Eyes:     General:        Right eye: No discharge.        Left eye: No discharge.     Conjunctiva/sclera: Conjunctivae normal.     Pupils: Pupils are equal, round, and reactive to light.  Cardiovascular:     Rate and Rhythm: Normal rate and regular rhythm.     Heart sounds: Normal heart sounds. No murmur heard.   Pulmonary:     Effort: Pulmonary effort is normal. No respiratory distress.     Breath sounds: Normal breath sounds. No wheezing, rhonchi or rales.  Chest:     Chest wall: No tenderness.  Musculoskeletal:     Cervical back: Normal range of motion and neck supple.     Right lower leg: No edema.     Left lower leg: No edema.  Skin:    General: Skin is warm and dry.  Neurological:     General: No focal deficit present.     Mental Status: She is alert and oriented to person, place, and time.     BP 122/80   Pulse (!) 110   SpO2 97%  Wt Readings from Last 3 Encounters:  06/23/20 224 lb (101.6 kg)  06/08/20 220 lb 2 oz (99.8 kg)  05/03/20 214 lb (97.1 kg)    Health Maintenance Due  Topic Date Due  . COVID-19 Vaccine (1) Never done    There are no preventive care reminders to display for this patient.   Lab Results  Component Value Date   TSH 2.650 05/06/2018   Lab Results  Component Value Date   WBC 4.0 06/28/2020   HGB 13.9 06/28/2020   HCT 41.5 06/28/2020   MCV 90.2 06/28/2020   PLT 281 06/28/2020   Lab  Results  Component Value Date   NA 133 (L) 06/28/2020  K 3.6 06/28/2020   CO2 21 (L) 06/28/2020   GLUCOSE 98 06/28/2020   BUN 8 06/28/2020   CREATININE 0.88 06/28/2020   BILITOT 0.5 06/28/2020   ALKPHOS 93 06/28/2020   AST 22 06/28/2020   ALT 22 06/28/2020   PROT 7.1 06/28/2020   ALBUMIN 3.7 06/28/2020   CALCIUM 8.9 06/28/2020   ANIONGAP 8 06/28/2020   No results found for: CHOL No results found for: HDL No results found for: LDLCALC No results found for: TRIG No results found for: CHOLHDL No results found for: XAJO8N     Assessment & Plan:   Tara Colon was seen today for uri.  Diagnoses and all orders for this visit:  Upper respiratory tract infection, unspecified type Symptoms x 7 days with improvement of symptoms followed by worsening of symptoms and return of fever 2 days ago. Seen in ED yesterday with normal CXR. Lungs clear on exam. Covid test pending, quarantine until results. Amoxicillin as below for possible secondary bacterial infection. Mucinex for congestion. Push fluids. Tylenol for fever, headache. Return to office for new or worsening symptoms, or if symptoms persist.  -     Novel Coronavirus, NAA ([facility]) -     amoxicillin (AMOXIL) 875 MG tablet; Take 1 tablet (875 mg total) by mouth 2 (two) times daily.   The patient indicates understanding of these issues and agrees with the plan.   Gabriel Earing, FNP

## 2020-06-30 LAB — NOVEL CORONAVIRUS, NAA: SARS-CoV-2, NAA: NOT DETECTED

## 2020-06-30 LAB — SARS-COV-2, NAA 2 DAY TAT

## 2020-07-04 DIAGNOSIS — G459 Transient cerebral ischemic attack, unspecified: Secondary | ICD-10-CM | POA: Diagnosis not present

## 2020-07-04 DIAGNOSIS — I959 Hypotension, unspecified: Secondary | ICD-10-CM | POA: Diagnosis not present

## 2020-07-04 DIAGNOSIS — T7840XA Allergy, unspecified, initial encounter: Secondary | ICD-10-CM | POA: Diagnosis not present

## 2020-07-04 DIAGNOSIS — G609 Hereditary and idiopathic neuropathy, unspecified: Secondary | ICD-10-CM | POA: Diagnosis not present

## 2020-07-04 DIAGNOSIS — R21 Rash and other nonspecific skin eruption: Secondary | ICD-10-CM | POA: Diagnosis not present

## 2020-07-04 DIAGNOSIS — L509 Urticaria, unspecified: Secondary | ICD-10-CM | POA: Diagnosis not present

## 2020-07-05 ENCOUNTER — Telehealth: Payer: Self-pay | Admitting: *Deleted

## 2020-07-05 NOTE — Telephone Encounter (Signed)
Transition Care Management Follow-up Telephone Call  Date of discharge and from where: 07/04/2020 [facility] ED  How have you been since you were released from the hospital? "Okay"  Any questions or concerns? No  Items Reviewed:  Did the pt receive and understand the discharge instructions provided? Yes   Medications obtained and verified? Yes   Other? No   Any new allergies since your discharge? No   Dietary orders reviewed? No  Do you have support at home? Yes     Functional Questionnaire: (I = Independent and D = Dependent) ADLs: I  Bathing/Dressing- I  Meal Prep- I  Eating- I  Maintaining continence- I  Transferring/Ambulation- I  Managing Meds- I  Follow up appointments reviewed:   PCP Hospital f/u appt confirmed? Yes Scheduled to see Lynnell Chad, NP on 08/04/2020 @ 0815.  Specialist Hospital f/u appt confirmed? No    Are transportation arrangements needed? No   If their condition worsens, is the pt aware to call PCP or go to the Emergency Dept.? Yes  Was the patient provided with contact information for the PCP's office or ED? Yes  Was to pt encouraged to call back with questions or concerns? Yes

## 2020-07-09 DIAGNOSIS — L659 Nonscarring hair loss, unspecified: Secondary | ICD-10-CM | POA: Diagnosis not present

## 2020-07-13 ENCOUNTER — Other Ambulatory Visit: Payer: Self-pay

## 2020-07-13 ENCOUNTER — Ambulatory Visit (INDEPENDENT_AMBULATORY_CARE_PROVIDER_SITE_OTHER): Payer: Medicaid Other | Admitting: *Deleted

## 2020-07-13 DIAGNOSIS — Z3042 Encounter for surveillance of injectable contraceptive: Secondary | ICD-10-CM

## 2020-07-13 MED ORDER — MEDROXYPROGESTERONE ACETATE 150 MG/ML IM SUSP
150.0000 mg | Freq: Once | INTRAMUSCULAR | Status: AC
  Administered 2020-07-13: 150 mg via INTRAMUSCULAR

## 2020-07-13 NOTE — Progress Notes (Signed)
   NURSE VISIT- INJECTION  SUBJECTIVE:  Tara Colon is a 30 y.o. G58P1102 female here for a Depo Provera for contraception/period management. She is a GYN patient.   OBJECTIVE:  There were no vitals taken for this visit.  Appears well, in no apparent distress  Injection administered in: Left deltoid  Meds ordered this encounter  Medications  . medroxyPROGESTERone (DEPO-PROVERA) injection 150 mg    ASSESSMENT: GYN patient Depo Provera for contraception/period management PLAN: Follow-up: in 11-13 weeks for next Depo   Annamarie Dawley  07/13/2020 9:29 AM

## 2020-07-21 ENCOUNTER — Other Ambulatory Visit (HOSPITAL_COMMUNITY)
Admission: RE | Admit: 2020-07-21 | Discharge: 2020-07-21 | Disposition: A | Discharge status: Home / Self Care | Payer: Medicaid Other | Source: Ambulatory Visit | Attending: Adult Health | Admitting: Adult Health

## 2020-07-21 ENCOUNTER — Other Ambulatory Visit: Payer: Self-pay

## 2020-07-21 ENCOUNTER — Ambulatory Visit (INDEPENDENT_AMBULATORY_CARE_PROVIDER_SITE_OTHER): Payer: Medicaid Other | Admitting: Adult Health

## 2020-07-21 ENCOUNTER — Encounter: Payer: Self-pay | Admitting: Adult Health

## 2020-07-21 VITALS — BP 106/71 | HR 80 | Ht 68.25 in | Wt 225.5 lb

## 2020-07-21 DIAGNOSIS — Z0000 Encounter for general adult medical examination without abnormal findings: Secondary | ICD-10-CM | POA: Insufficient documentation

## 2020-07-21 DIAGNOSIS — Z113 Encounter for screening for infections with a predominantly sexual mode of transmission: Secondary | ICD-10-CM | POA: Insufficient documentation

## 2020-07-21 DIAGNOSIS — Z01419 Encounter for gynecological examination (general) (routine) without abnormal findings: Secondary | ICD-10-CM | POA: Insufficient documentation

## 2020-07-21 NOTE — Progress Notes (Signed)
Patient ID: Tara Colon, female   DOB: 1990-09-19, 30 y.o.   MRN: [id number] History of Present Illness:  Tara Colon is a 30 year old black female,single, G2P1102 in for a well woman gyn exam and pap. She took septra ds for folliculitis and had rash so stopped. She requests HIV and RPR today. PCP is [facility].  Current Medications, Allergies, Past Medical History, Past Surgical History, Family History and Social History were reviewed in [name] record.     Review of Systems:  Patient denies any headaches, hearing loss, fatigue, blurred vision, shortness of breath, chest pain, abdominal pain, problems with bowel movements, urination, or intercourse. No joint pain or mood swings.   Physical Exam:BP 106/71 (BP Location: Left Arm, Patient Position: Sitting, Cuff Size: Large)   Pulse 80   Ht 5' 8.25" (1.734 m)   Wt 225 lb 8 oz (102.3 kg)   LMP 07/07/2020 (Approximate)   Breastfeeding No   BMI 34.04 kg/m  General:  Well developed, well nourished, no acute distress Skin:  Warm and dry Neck:  Midline trachea, normal thyroid, good ROM, no lymphadenopathy Lungs; Clear to auscultation bilaterally Breast:  No dominant palpable mass, retraction, or nipple discharge Cardiovascular: Regular rate and rhythm Abdomen:  Soft, non tender, no hepatosplenomegaly Pelvic:  External genitalia is normal in appearance, no lesions, has scarring.  The vagina is normal in appearance. Urethra has no lesions or masses. The cervix is bulbous,pap with GC/CHL and HR HPV genotyping performed.  Uterus is felt to be normal size, shape, and contour.  No adnexal masses or tenderness noted.Bladder is non tender, no masses felt. Extremities/musculoskeletal:  No swelling or varicosities noted, no clubbing or cyanosis Psych:  No mood changes, alert and cooperative,seems happy AA is 6 Fall risk is low PHQ 9 score is 7, no SI,pn lexapro GAD 7 score is 6  Upstream - 07/21/20 0905       Pregnancy Intention Screening   Does the patient want to become pregnant in the next year? No    Does the patient's partner want to become pregnant in the next year? No    Would the patient like to discuss contraceptive options today? No      Contraception Wrap Up   Current Method Hormonal Injection    End Method Hormonal Injection    Contraception Counseling Provided No         Examination chaperoned by Marylu Lund LPN  Impression and Plan: 1. Routine general medical examination at a health care facility Pap sent   2. Screening examination for STD (sexually transmitted disease) Check HIV and RPR  3. Encounter for gynecological examination with Papanicolaou smear of cervix Pap sent Physical in 1 year Pap in 3 if normal

## 2020-07-22 LAB — CYTOLOGY - PAP
Adequacy: ABSENT
Chlamydia: NEGATIVE
Comment: NEGATIVE
Comment: NEGATIVE
Comment: NORMAL
Diagnosis: NEGATIVE
High risk HPV: NEGATIVE
Neisseria Gonorrhea: NEGATIVE

## 2020-07-22 LAB — RPR: RPR Ser Ql: NONREACTIVE

## 2020-07-22 LAB — HIV ANTIBODY (ROUTINE TESTING W REFLEX): HIV Screen 4th Generation wRfx: NONREACTIVE

## 2020-07-23 DIAGNOSIS — L659 Nonscarring hair loss, unspecified: Secondary | ICD-10-CM | POA: Diagnosis not present

## 2020-07-28 DIAGNOSIS — H9202 Otalgia, left ear: Secondary | ICD-10-CM | POA: Diagnosis not present

## 2020-07-28 DIAGNOSIS — H6122 Impacted cerumen, left ear: Secondary | ICD-10-CM | POA: Diagnosis not present

## 2020-08-03 ENCOUNTER — Ambulatory Visit: Payer: Medicaid Other | Admitting: Nurse Practitioner

## 2020-08-04 ENCOUNTER — Ambulatory Visit: Payer: Medicaid Other | Admitting: Nurse Practitioner

## 2020-08-09 DIAGNOSIS — R059 Cough, unspecified: Secondary | ICD-10-CM | POA: Diagnosis not present

## 2020-08-10 ENCOUNTER — Telehealth: Payer: Self-pay

## 2020-08-10 NOTE — Telephone Encounter (Signed)
Transition Care Management Unsuccessful Follow-up Telephone Call  Date of discharge and from where:  08/09/2020 from [facility]  Attempts:  1st Attempt  Reason for unsuccessful TCM follow-up call:  No answer/busy

## 2020-08-11 NOTE — Telephone Encounter (Signed)
Transition Care Management Unsuccessful Follow-up Telephone Call  Date of discharge and from where:  08/09/2020 from [facility]  Attempts:  2nd Attempt  Reason for unsuccessful TCM follow-up call:  No answer/busy

## 2020-08-12 NOTE — Telephone Encounter (Signed)
Transition Care Management Unsuccessful Follow-up Telephone Call  Date of discharge and from where:  08/09/2020 [facility] ED  Attempts:  3rd Attempt  Reason for unsuccessful TCM follow-up call:  No answer/busy

## 2020-09-28 ENCOUNTER — Ambulatory Visit: Payer: Medicaid Other

## 2020-09-28 ENCOUNTER — Encounter: Payer: Self-pay | Admitting: Adult Health

## 2020-09-28 ENCOUNTER — Ambulatory Visit: Payer: Medicaid Other | Admitting: Adult Health

## 2020-09-28 ENCOUNTER — Other Ambulatory Visit: Payer: Self-pay

## 2020-09-28 VITALS — BP 120/62 | HR 65 | Ht 67.0 in | Wt 226.0 lb

## 2020-09-28 DIAGNOSIS — Z3202 Encounter for pregnancy test, result negative: Secondary | ICD-10-CM | POA: Insufficient documentation

## 2020-09-28 DIAGNOSIS — Z30015 Encounter for initial prescription of vaginal ring hormonal contraceptive: Secondary | ICD-10-CM | POA: Insufficient documentation

## 2020-09-28 LAB — POCT URINE PREGNANCY: Preg Test, Ur: NEGATIVE

## 2020-09-28 MED ORDER — ETONOGESTREL-ETHINYL ESTRADIOL 0.12-0.015 MG/24HR VA RING: VAGINAL_RING | VAGINAL | 12 refills | Status: DC

## 2020-09-28 NOTE — Progress Notes (Signed)
  Subjective:     Patient ID: Tara Colon, female   DOB: [birth year], 30 y.o.   MRN: [id number]  HPI Tara Colon is a 30 year old black female,single, G2P1102, in to discuss changing from depo to the ring. PCP is Lynnell Chad NP  Lab Results  Component Value Date   DIAGPAP  07/21/2020    - Negative for intraepithelial lesion or malignancy (NILM)   HPVHIGH Negative 07/21/2020     Review of Systems Patient denies any headaches, hearing loss, fatigue, blurred vision, shortness of breath, chest pain, abdominal pain, problems with bowel movements, urination, or intercourse. No joint pain or mood swings. Reviewed past medical,surgical, social and family history. Reviewed medications and allergies.     Objective:   Physical Exam BP 120/62 (BP Location: Left Arm, Patient Position: Sitting, Cuff Size: Normal)   Pulse 65   Ht 5\' 7"  (1.702 m)   Wt 226 lb (102.5 kg)   LMP 09/22/2020   BMI 35.40 kg/m  UPT is negative. Skin warm and dry. Lungs: clear to ausculation bilaterally. Cardiovascular: regular rate and rhythm.  Fall risk is low  Upstream - 09/28/20 1104       Pregnancy Intention Screening   Does the patient want to become pregnant in the next year? No    Does the patient's partner want to become pregnant in the next year? No    Would the patient like to discuss contraceptive options today? Yes      Contraception Wrap Up   Current Method Hormonal Injection    End Method Vaginal Ring    Contraception Counseling Provided Yes                Assessment:     1. Pregnancy test negative   2. Encounter for initial prescription of vaginal ring hormonal contraceptive Put ring today, wear 3 weeks and then remove for 1 week  Meds ordered this encounter  Medications   etonogestrel-ethinyl estradiol (NUVARING) 0.12-0.015 MG/24HR vaginal ring    Sig: Insert vaginally and leave in place for 3 consecutive weeks, then remove for 1 week.    Dispense:  1 each    Refill:  12    Order  Specific Question:   Supervising Provider    Answer:   04-15-1973 [[id number]]       Plan:     Follow up in 3 months for ROS

## 2020-11-21 DIAGNOSIS — R079 Chest pain, unspecified: Secondary | ICD-10-CM | POA: Diagnosis not present

## 2020-11-23 ENCOUNTER — Telehealth: Payer: Self-pay

## 2020-11-23 NOTE — Telephone Encounter (Signed)
Transition Care Management Follow-up Telephone Call Date of discharge and from where: 11/21/2020 from [facility]. How have you been since you were released from the hospital? Pt stated that she is feeling better and did not have any questions or concerns at this time.  Any questions or concerns? No  Items Reviewed: Did the pt receive and understand the discharge instructions provided? Yes  Medications obtained and verified? Yes  Other? No  Any new allergies since your discharge? No  Dietary orders reviewed? No Do you have support at home? Yes   Functional Questionnaire: (I = Independent and D = Dependent) ADLs: I  Bathing/Dressing- I  Meal Prep- I  Eating- I  Maintaining continence- I  Transferring/Ambulation- I  Managing Meds- I   Follow up appointments reviewed:  PCP Hospital f/u appt confirmed? No   Specialist Hospital f/u appt confirmed? No   Are transportation arrangements needed? No  If their condition worsens, is the pt aware to call PCP or go to the Emergency Dept.? Yes Was the patient provided with contact information for the PCP's office or ED? Yes Was to pt encouraged to call back with questions or concerns? Yes

## 2020-12-29 ENCOUNTER — Ambulatory Visit: Payer: Medicaid Other | Admitting: Adult Health

## 2021-01-05 ENCOUNTER — Ambulatory Visit: Payer: Medicaid Other | Admitting: Adult Health

## 2021-01-05 ENCOUNTER — Encounter: Payer: Self-pay | Admitting: Adult Health

## 2021-01-05 ENCOUNTER — Other Ambulatory Visit: Payer: Self-pay

## 2021-01-05 VITALS — BP 105/72 | HR 76 | Ht 68.0 in | Wt 222.0 lb

## 2021-01-05 DIAGNOSIS — Z09 Encounter for follow-up examination after completed treatment for conditions other than malignant neoplasm: Secondary | ICD-10-CM | POA: Diagnosis not present

## 2021-01-05 DIAGNOSIS — Z308 Encounter for other contraceptive management: Secondary | ICD-10-CM

## 2021-01-05 NOTE — Progress Notes (Signed)
  Subjective:     Patient ID: Tara Colon, female   DOB: April 04, 1990, 30 y.o.   MRN: [id number]  HPI Tara Colon is a 30 year old black female, single, G2P1102, in for follow up on starting nuva ring after depo and she stopped it had tingling in legs when ring in. She is not currently sexually active and declines more birth control. PCP is [facility] Value Date   DIAGPAP  07/21/2020    - Negative for intraepithelial lesion or malignancy (NILM)   HPVHIGH Negative 07/21/2020    Review of Systems +tingling in legs when nuva ring in,none now  Not having sex  Reviewed past medical,surgical, social and family history. Reviewed medications and allergies.     Objective:   Physical Exam BP 105/72 (BP Location: Left Arm, Patient Position: Sitting, Cuff Size: Large)   Pulse 76   Ht 5\' 8"  (1.727 m)   Wt 222 lb (100.7 kg)   LMP 12/18/2020 (Approximate)   BMI 33.75 kg/m     Skin warm and dry. Lungs: clear to ausculation bilaterally. Cardiovascular: regular rate and rhythm..   Fall risk is low  Upstream - 01/05/21 1107       Pregnancy Intention Screening   Does the patient want to become pregnant in the next year? No    Does the patient's partner want to become pregnant in the next year? No    Would the patient like to discuss contraceptive options today? No      Contraception Wrap Up   Current Method Abstinence    End Method Abstinence    Contraception Counseling Provided No             Assessment:     1. Encounter for follow-up     Plan:     Follow up in 7 months for physical

## 2021-01-20 ENCOUNTER — Ambulatory Visit: Payer: Medicaid Other | Admitting: Family Medicine

## 2021-01-20 ENCOUNTER — Encounter: Payer: Self-pay | Admitting: Family Medicine

## 2021-01-20 ENCOUNTER — Other Ambulatory Visit: Payer: Self-pay

## 2021-01-20 VITALS — BP 113/64 | HR 74 | Temp 97.4°F | Ht 68.0 in | Wt 218.6 lb

## 2021-01-20 DIAGNOSIS — Z711 Person with feared health complaint in whom no diagnosis is made: Secondary | ICD-10-CM | POA: Diagnosis not present

## 2021-01-20 DIAGNOSIS — Z23 Encounter for immunization: Secondary | ICD-10-CM | POA: Diagnosis not present

## 2021-01-20 NOTE — Progress Notes (Signed)
   Assessment & Plan:  1. Feared complaint without diagnosis - ankle discoloration consistent with sun tan  2. Need for immunization against influenza - influenza vaccine today   Follow up plan: Return if symptoms worsen or fail to improve.  Floy Sabina, NP Student  I personally was present during the history, physical exam, and medical decision-making activities of this service and have verified that the service and findings are accurately documented in the nurse practitioner student's note.  Deliah Boston, MSN, APRN, FNP-C [facility]   Subjective:   Patient ID: Tara Colon, female    DOB: 07-02-1990, 30 y.o.   MRN: [id number]  HPI: Tara Colon is a 30 y.o. female presenting on 01/20/2021 for ankles discoloration (X 3 days bilateral)   She states she noticed bands about 1-2" in width on her ankles bilaterally the other morning in the shower, and she could not tell if it was a tan line or other abnormality. She states in the summer she frequently wore cropped pants and shoes with socks that match the pattern on her ankles.    ROS: Negative unless specifically indicated above in HPI.   Relevant past medical history reviewed and updated as indicated.   Allergies and medications reviewed and updated.  No current outpatient medications on file.  Allergies  Allergen Reactions   Amoxicillin Rash    "feel bad"   Bactrim [Sulfamethoxazole-Trimethoprim] Rash    "feels bad"     Objective:   BP 113/64   Pulse 74   Temp (!) 97.4 F (36.3 C) (Temporal)   Ht 5\' 8"  (1.727 m)   Wt 99.2 kg   BMI 33.24 kg/m    Physical Exam Vitals reviewed.  Constitutional:      General: She is not in acute distress.    Appearance: Normal appearance. She is obese. She is not ill-appearing, toxic-appearing or diaphoretic.  HENT:     Head: Normocephalic and atraumatic.     Nose: Nose normal.     Mouth/Throat:     Mouth: Mucous membranes are moist.     Pharynx:  Oropharynx is clear.  Eyes:     Extraocular Movements: Extraocular movements intact.     Conjunctiva/sclera: Conjunctivae normal.     Pupils: Pupils are equal, round, and reactive to light.  Pulmonary:     Effort: Pulmonary effort is normal. No respiratory distress.  Abdominal:     General: There is no distension.     Palpations: Abdomen is soft. There is no mass.  Musculoskeletal:        General: Normal range of motion.     Cervical back: Normal range of motion.  Skin:    General: Skin is warm and dry.     Comments: Bilateral hyperpigmented bands on ankles consistent with sun tan   Neurological:     General: No focal deficit present.     Mental Status: She is alert and oriented to person, place, and time.     Motor: No weakness.     Gait: Gait normal.  Psychiatric:        Mood and Affect: Mood normal.        Behavior: Behavior normal.        Thought Content: Thought content normal.        Judgment: Judgment normal.

## 2021-02-03 ENCOUNTER — Ambulatory Visit: Payer: Medicaid Other | Admitting: Adult Health

## 2021-02-03 ENCOUNTER — Encounter: Payer: Self-pay | Admitting: Adult Health

## 2021-02-03 ENCOUNTER — Other Ambulatory Visit: Payer: Self-pay

## 2021-02-03 VITALS — BP 125/66 | HR 80 | Ht 68.0 in | Wt 218.8 lb

## 2021-02-03 DIAGNOSIS — R238 Other skin changes: Secondary | ICD-10-CM

## 2021-02-03 MED ORDER — VALACYCLOVIR HCL 1 G PO TABS: ORAL_TABLET | ORAL | 1 refills | Status: DC

## 2021-02-03 MED ORDER — TRIAMCINOLONE ACETONIDE 0.5 % EX OINT: 1.0000 "application " | TOPICAL_OINTMENT | Freq: Two times a day (BID) | CUTANEOUS | 0 refills | Status: DC

## 2021-02-03 NOTE — Progress Notes (Signed)
  Subjective:     Patient ID: Tara Colon, female   DOB: [birth year], 30 y.o.   MRN: [id number]  HPI Tara Colon is a 30 year old black female, single, G2P1102, in complaining of bumps around rectal area, and itching there.  Lab Results  Component Value Date   DIAGPAP  07/21/2020    - Negative for intraepithelial lesion or malignancy (NILM)   HPVHIGH Negative 07/21/2020    Review of Systems Has bumps around rectum and itching for 1 day No sex since 2020 Denies any new products.  Reviewed past medical,surgical, social and family history. Reviewed medications and allergies.     Objective:   Physical Exam BP 125/66 (BP Location: Right Arm, Patient Position: Sitting, Cuff Size: Normal)   Pulse 80   Ht 5\' 8"  (1.727 m)   Wt 218 lb 12.8 oz (99.2 kg)   LMP 01/13/2021   BMI 33.27 kg/m   Skin warm and dry, on broken vesicles around rectal area, both sides, HSV culture obtained.    Examination chaperoned by 01/15/2021 LPN   Upstream - 02/03/21 1443       Pregnancy Intention Screening   Does the patient want to become pregnant in the next year? No    Does the patient's partner want to become pregnant in the next year? No    Would the patient like to discuss contraceptive options today? No      Contraception Wrap Up   Current Method Abstinence    End Method Abstinence    Contraception Counseling Provided No             Assessment:     1. Vesicles Herpes culture sent Will rx valtrex and kenalog Meds ordered this encounter  Medications   valACYclovir (VALTREX) 1000 MG tablet    Sig: Take 1 bid x 10 days    Dispense:  20 tablet    Refill:  1    Order Specific Question:   Supervising Provider    Answer:   02/05/21 H [[id number]]   triamcinolone ointment (KENALOG) 0.5 %    Sig: Apply 1 application topically 2 (two) times daily.    Dispense:  30 g    Refill:  0    Order Specific Question:   Supervising Provider    Answer:   Duane Lope [[id number]]       Plan:      Follow up prn

## 2021-02-06 LAB — HERPES SIMPLEX VIRUS CULTURE

## 2021-02-07 NOTE — Telephone Encounter (Signed)
Pt is better, so finish valtrex, will check HSV antibodies in 3 months

## 2021-04-01 ENCOUNTER — Ambulatory Visit: Payer: Medicaid Other | Admitting: Nurse Practitioner

## 2021-04-01 ENCOUNTER — Encounter: Payer: Self-pay | Admitting: Nurse Practitioner

## 2021-04-01 ENCOUNTER — Other Ambulatory Visit: Payer: Self-pay | Admitting: Nurse Practitioner

## 2021-04-01 VITALS — BP 102/54 | HR 85 | Temp 98.1°F | Resp 20 | Ht 68.0 in | Wt 222.0 lb

## 2021-04-01 DIAGNOSIS — F319 Bipolar disorder, unspecified: Secondary | ICD-10-CM

## 2021-04-01 DIAGNOSIS — F1099 Alcohol use, unspecified with unspecified alcohol-induced disorder: Secondary | ICD-10-CM | POA: Diagnosis not present

## 2021-04-01 DIAGNOSIS — J029 Acute pharyngitis, unspecified: Secondary | ICD-10-CM

## 2021-04-01 LAB — CULTURE, GROUP A STREP

## 2021-04-01 LAB — RAPID STREP SCREEN (MED CTR MEBANE ONLY): Strep Gp A Ag, IA W/Reflex: NEGATIVE

## 2021-04-01 NOTE — Addendum Note (Signed)
Addended by: Zannie Cove on: 04/01/2021 04:16 PM   Modules accepted: Orders
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
## 2021-04-01 NOTE — Patient Instructions (Signed)
Sore Throat ?A sore throat is pain, burning, irritation, or scratchiness in the throat. When you have a sore throat, you may feel pain or tenderness in your throat when you swallow or talk. ?Many things can cause a sore throat, including: ?An infection. ?Seasonal allergies. ?Dryness in the air. ?Irritants, such as smoke or pollution. ?Radiation treatment for cancer. ?Gastroesophageal reflux disease (GERD). ?A tumor. ?A sore throat is often the first sign of another sickness. It may happen with other symptoms, such as coughing, sneezing, fever, and swollen neck glands. Most sore throats go away without medical treatment. ?Follow these instructions at home: ?  ?Medicines ?Take over-the-counter and prescription medicines only as told by your health care provider. ?Children often get sore throats. Do not give your child aspirin because of the association with Reye's syndrome. ?Use throat sprays to soothe your throat as told by your health care provider. ?Managing pain ?To help with pain, try: ?Sipping warm liquids, such as broth, herbal tea, or warm water. ?Eating or drinking cold or frozen liquids, such as frozen ice pops. ?Gargling with a mixture of salt and water 3-4 times a day or as needed. To make salt water, completely dissolve ?-1 tsp (3-6 g) of salt in 1 cup (237 mL) of warm water. ?Sucking on hard candy or throat lozenges. ?Putting a cool-mist humidifier in your bedroom at night to moisten the air. ?Sitting in the bathroom with the door closed for 5-10 minutes while you run hot water in the shower. ?General instructions ?Do not use any products that contain nicotine or tobacco. These products include cigarettes, chewing tobacco, and vaping devices, such as e-cigarettes. If you need help quitting, ask your health care provider. ?Rest as needed. ?Drink enough fluid to keep your urine pale yellow. ?Wash your hands often with soap and water for at least 20 seconds. If soap and water are not available, use hand  sanitizer. ?Contact a health care provider if: ?You have a fever for more than 2-3 days. ?You have symptoms that last for more than 2-3 days. ?Your throat does not get better within 7 days. ?You have a fever and your symptoms suddenly get worse. ?Get help right away if: ?You have difficulty breathing. ?You cannot swallow fluids, soft foods, or your saliva. ?You have increased swelling in your throat or neck. ?You have persistent nausea and vomiting. ?These symptoms may represent a serious problem that is an emergency. Do not wait to see if the symptoms will go away. Get medical help right away. Call your local emergency services (911 in the [place] Do not drive yourself to the hospital. ?Summary ?A sore throat is pain, burning, irritation, or scratchiness in the throat. Many things can cause a sore throat. ?Take over-the-counter medicines only as told by your health care provider. ?Rest as needed. ?Drink enough fluid to keep your urine pale yellow. ?Contact a health care provider if your throat does not get better within 7 days. ?This information is not intended to replace advice given to you by your health care provider. Make sure you discuss any questions you have with your health care provider. ?Document Revised: 06/02/2020 Document Reviewed: 06/02/2020 ?Elsevier Patient Education ? 2022 [employer] ? ?

## 2021-04-04 LAB — CULTURE, GROUP A STREP: Strep A Culture: NEGATIVE

## 2021-04-20 IMAGING — US US MFM FETAL BPP WO NON STRESS
1 series · 6 of 6 positions shown · non-contrast
Comparison: none

[Series 1: us mfm fetal bpp wo non stress · 6 acquisitions, 6 frames shown]
[im 1/6]
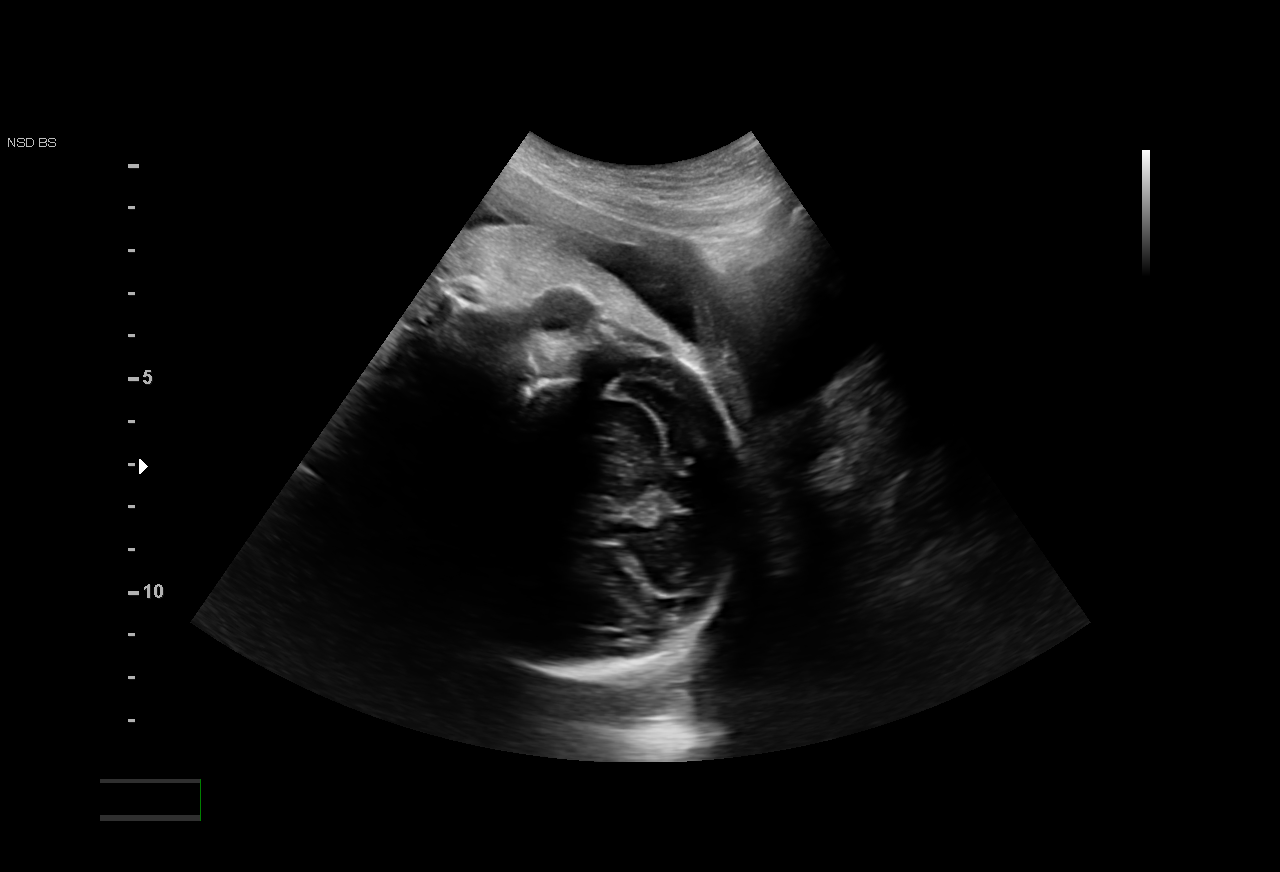
[im 2/6]
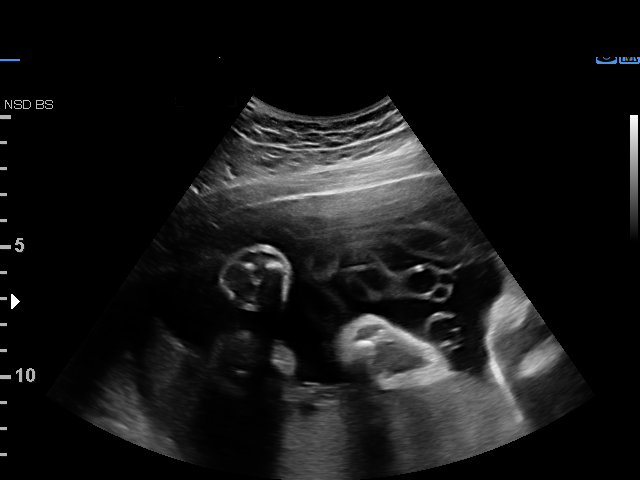
[im 3/6]
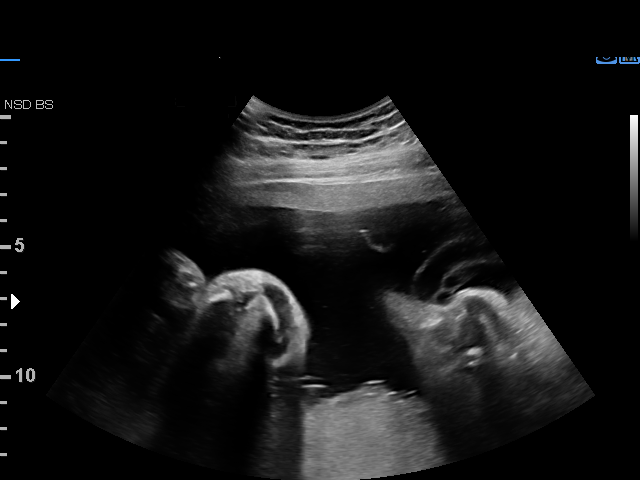
[im 4/6]
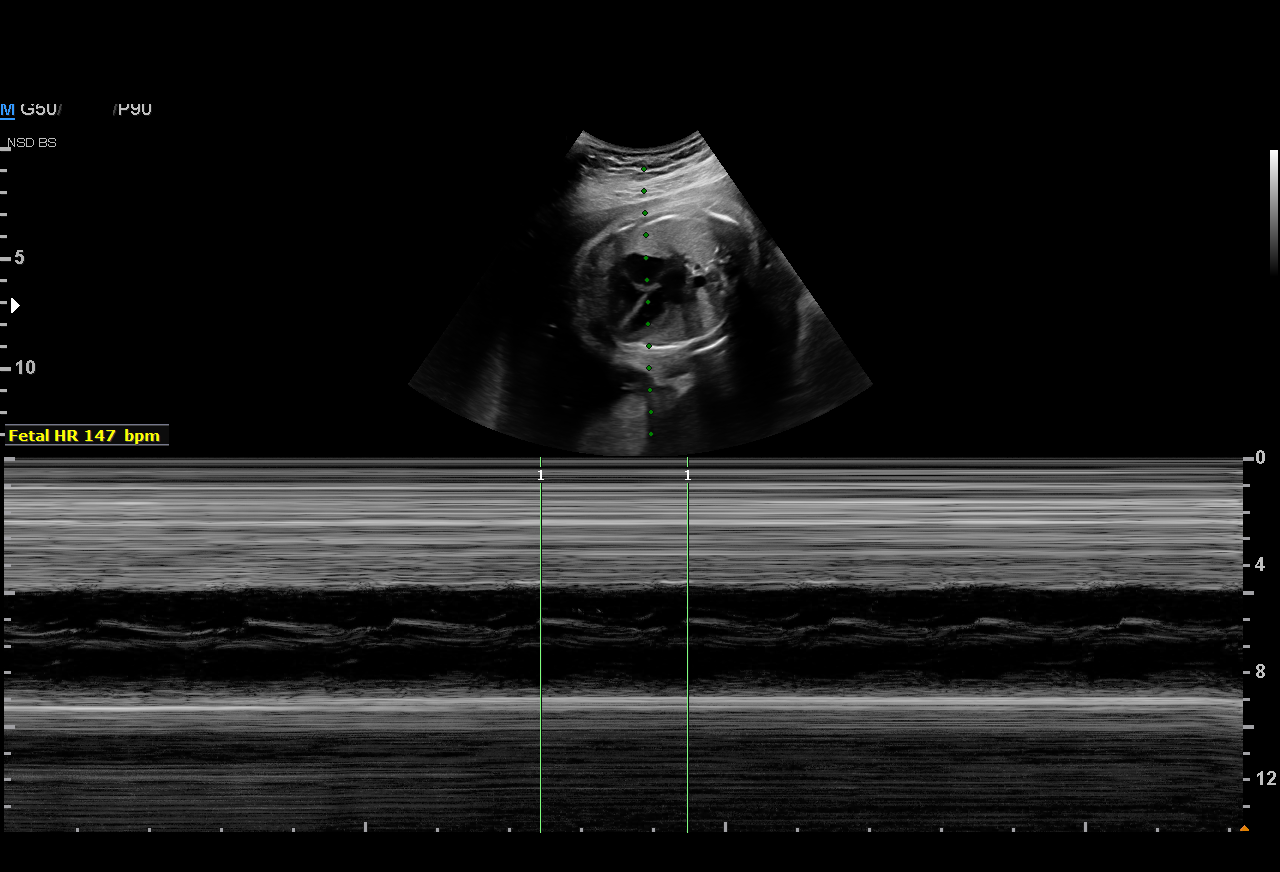
[im 5/6]
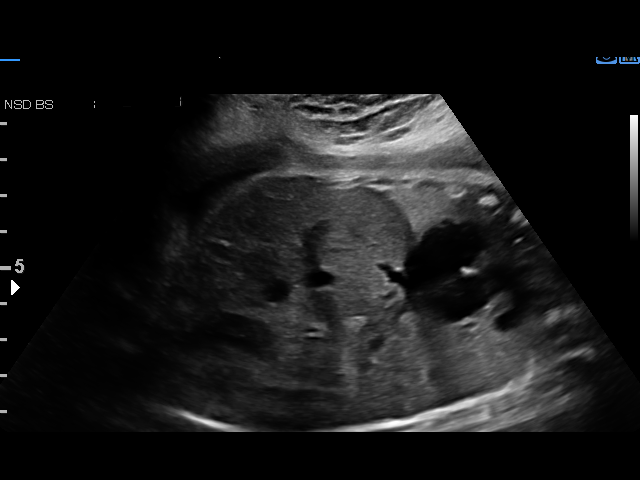
[im 6/6]
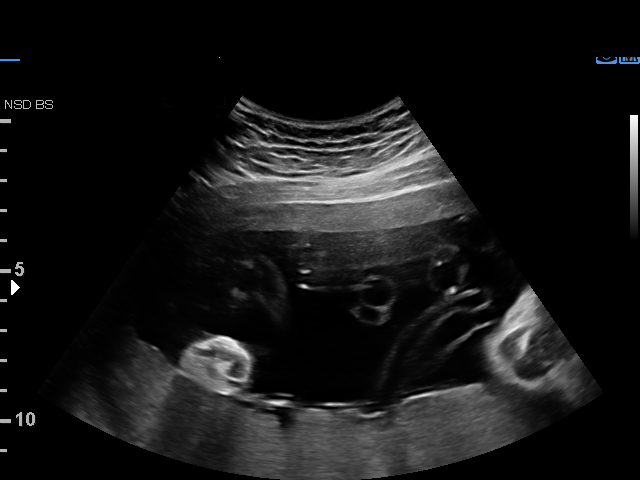

[6 of 6 positions shown; findings below may reference images not displayed]

MAU/Triage

 ----------------------------------------------------------------------

 ----------------------------------------------------------------------
Indications

  Decreased fetal movements, second
  trimester, unspecified
  29 weeks gestation of pregnancy
 ----------------------------------------------------------------------
Fetal Evaluation

 Num Of Fetuses:          1
 Fetal Heart Rate(bpm):   147
 Cardiac Activity:        Observed
 Presentation:            Cephalic

 Amniotic Fluid
 AFI FV:      Within normal limits

 AFI Sum(cm)     %Tile       Largest Pocket(cm)
 15.07           53

 RUQ(cm)       RLQ(cm)       LUQ(cm)        LLQ(cm)

Biophysical Evaluation

 Amniotic F.V:   Within normal limits       F. Tone:         Observed
 F. Movement:    Observed                   Score:           [DATE]
 F. Breathing:   Observed
OB History

 Gravidity:    2         Term:   1        Prem:   0        SAB:   0
 TOP:          0       Ectopic:  0        Living: 1
Gestational Age
 Best:          29w 2d     Det. By:  Previous Ultrasound      EDD:   01/16/19
Impression

 Biophysical profile [DATE]
 Decreased fetal movement.
Recommendations

 Follow up as clinically indicated.

## 2021-04-26 ENCOUNTER — Encounter: Payer: Self-pay | Admitting: Nurse Practitioner

## 2021-04-26 ENCOUNTER — Ambulatory Visit: Payer: Medicaid Other | Admitting: Nurse Practitioner

## 2021-04-26 VITALS — BP 120/76 | HR 84 | Temp 98.3°F | Ht 68.0 in | Wt 224.0 lb

## 2021-04-26 DIAGNOSIS — F418 Other specified anxiety disorders: Secondary | ICD-10-CM

## 2021-04-26 DIAGNOSIS — F419 Anxiety disorder, unspecified: Secondary | ICD-10-CM | POA: Diagnosis not present

## 2021-04-26 DIAGNOSIS — R300 Dysuria: Secondary | ICD-10-CM | POA: Diagnosis not present

## 2021-04-26 LAB — MICROSCOPIC EXAMINATION
RBC, Urine: NONE SEEN /hpf (ref 0–2)
Renal Epithel, UA: NONE SEEN /hpf
WBC, UA: NONE SEEN /hpf (ref 0–5)

## 2021-04-26 LAB — URINALYSIS, ROUTINE W REFLEX MICROSCOPIC
Bilirubin, UA: NEGATIVE
Glucose, UA: NEGATIVE
Ketones, UA: NEGATIVE
Leukocytes,UA: NEGATIVE
Nitrite, UA: NEGATIVE
Specific Gravity, UA: >1.03 — ABNORMAL HIGH (ref 1.005–1.030)
Urobilinogen, Ur: 0.2 mg/dL (ref 0.2–1.0)
pH, UA: 5.5 (ref 5.0–7.5)

## 2021-04-26 MED ORDER — ESCITALOPRAM OXALATE 10 MG PO TABS: 10.0000 mg | ORAL_TABLET | Freq: Every day | ORAL | 3 refills | Status: DC

## 2021-04-26 MED ORDER — NITROFURANTOIN MONOHYD MACRO 100 MG PO CAPS: 100.0000 mg | ORAL_CAPSULE | Freq: Two times a day (BID) | ORAL | 0 refills | Status: DC

## 2021-04-26 MED ORDER — HYDROXYZINE HCL 10 MG PO TABS: 10.0000 mg | ORAL_TABLET | Freq: Three times a day (TID) | ORAL | 0 refills | Status: DC | PRN

## 2021-04-26 MED ORDER — PHENAZOPYRIDINE HCL 95 MG PO TABS: 95.0000 mg | ORAL_TABLET | Freq: Three times a day (TID) | ORAL | 0 refills | Status: DC | PRN

## 2021-04-26 NOTE — Patient Instructions (Addendum)
Follow up 6 weeks for anxiety and depression   Generalized Anxiety Disorder, Adult Generalized anxiety disorder (GAD) is a mental health condition. Unlike normal worries, anxiety related to GAD is not triggered by a specific event. These worries do not fade or get better with time. GAD interferes with relationships, work, and school. GAD symptoms can vary from mild to severe. People with severe GAD can have intense waves of anxiety with physical symptoms that are similar to panic attacks. What are the causes? The exact cause of GAD is not known, but the following are believed to have an impact: Differences in natural brain chemicals. Genes passed down from parents to children. Differences in the way threats are perceived. Development and stress during childhood. Personality. What increases the risk? The following factors may make you more likely to develop this condition: Being female. Having a family history of anxiety disorders. Being very shy. Experiencing very stressful life events, such as the death of a loved one. Having a very stressful family environment. What are the signs or symptoms? People with GAD often worry excessively about many things in their lives, such as their health and family. Symptoms may also include: Mental and emotional symptoms: Worrying excessively about natural disasters. Fear of being late. Difficulty concentrating. Fears that others are judging your performance. Physical symptoms: Fatigue. Headaches, muscle tension, muscle twitches, trembling, or feeling shaky. Feeling like your heart is pounding or beating very fast. Feeling out of breath or like you cannot take a deep breath. Having trouble falling asleep or staying asleep, or experiencing restlessness. Sweating. Nausea, diarrhea, or irritable bowel syndrome (IBS). Behavioral symptoms: Experiencing erratic moods or irritability. Avoidance of new situations. Avoidance of people. Extreme  difficulty making decisions. How is this diagnosed? This condition is diagnosed based on your symptoms and medical history. You will also have a physical exam. Your health care provider may perform tests to rule out other possible causes of your symptoms. To be diagnosed with GAD, a person must have anxiety that: Is out of his or her control. Affects several different aspects of his or her life, such as work and relationships. Causes distress that makes him or her unable to take part in normal activities. Includes at least three symptoms of GAD, such as restlessness, fatigue, trouble concentrating, irritability, muscle tension, or sleep problems. Before your health care provider can confirm a diagnosis of GAD, these symptoms must be present more days than they are not, and they must last for 6 months or longer. How is this treated? This condition may be treated with: Medicine. Antidepressant medicine is usually prescribed for long-term daily control. Anti-anxiety medicines may be added in severe cases, especially when panic attacks occur. Talk therapy (psychotherapy). Certain types of talk therapy can be helpful in treating GAD by providing support, education, and guidance. Options include: Cognitive behavioral therapy (CBT). People learn coping skills and self-calming techniques to ease their physical symptoms. They learn to identify unrealistic thoughts and behaviors and to replace them with more appropriate thoughts and behaviors. Acceptance and commitment therapy (ACT). This treatment teaches people how to be mindful as a way to cope with unwanted thoughts and feelings. Biofeedback. This process trains you to manage your body's response (physiological response) through breathing techniques and relaxation methods. You will work with a therapist while machines are used to monitor your physical symptoms. Stress management techniques. These include yoga, meditation, and exercise. A mental health  specialist can help determine which treatment is best for you. Some people see  improvement with one type of therapy. However, other people require a combination of therapies. Follow these instructions at home: Lifestyle Maintain a consistent routine and schedule. Anticipate stressful situations. Create a plan and allow extra time to work with your plan. Practice stress management or self-calming techniques that you have learned from your therapist or your health care provider. Exercise regularly and spend time outdoors. Eat a healthy diet that includes plenty of vegetables, fruits, whole grains, low-fat dairy products, and lean protein. Do not eat a lot of foods that are high in fat, added sugar, or salt (sodium). Drink plenty of water. Avoid alcohol. Alcohol can increase anxiety. Avoid caffeine and certain over-the-counter cold medicines. These may make you feel worse. Ask your pharmacist which medicines to avoid. General instructions Take over-the-counter and prescription medicines only as told by your health care provider. Understand that you are likely to have setbacks. Accept this and be kind to yourself as you persist to take better care of yourself. Anticipate stressful situations. Create a plan and allow extra time to work with your plan. Recognize and accept your accomplishments, even if you judge them as small. Spend time with people who care about you. Keep all follow-up visits. This is important. Where to find more information [employer] of [employer]: http://www.maynard.net/ Substance Abuse and Mental Health Services: [employer].pt Contact a health care provider if: Your symptoms do not get better. Your symptoms get worse. You have signs of depression, such as: A persistently sad or irritable mood. Loss of enjoyment in activities that used to bring you joy. Change in weight or eating. Changes in sleeping habits. Get help right away if: You have thoughts about hurting  yourself or others. If you ever feel like you may hurt yourself or others, or have thoughts about taking your own life, get help right away. Go to your nearest emergency department or: Call your local emergency services (911 in the [place] Call a suicide crisis helpline, such as the National Suicide Prevention Lifeline at 513-017-0835 or 988 in the [place] This is open 24 hours a day in the [place] Text the Crisis Text Line at 814-845-3751 (in the [place] Summary Generalized anxiety disorder (GAD) is a mental health condition that involves worry that is not triggered by a specific event. People with GAD often worry excessively about many things in their lives, such as their health and family. GAD may cause symptoms such as restlessness, trouble concentrating, sleep problems, frequent sweating, nausea, diarrhea, headaches, and trembling or muscle twitching. A mental health specialist can help determine which treatment is best for you. Some people see improvement with one type of therapy. However, other people require a combination of therapies. This information is not intended to replace advice given to you by your health care provider. Make sure you discuss any questions you have with your health care provider. Document Revised: 09/29/2020 Document Reviewed: 06/27/2020 Elsevier Patient Education  2022 [employer] Major Depressive Disorder, Adult Major depressive disorder is a mental health condition. This disorder affects feelings. It can also affect the body. Symptoms of this condition last most of the day, almost every day, for 2 weeks. This disorder can affect: Relationships. Daily activities, such as work and school. Activities that you normally like to do. What are the causes? The cause of this condition is not known. The disorder is likely caused by a mix of things, including: Your personality, such as being a shy person. Your behavior, or how you act toward others. Your thoughts and  feelings. Too much alcohol or drugs. How you react to stress. Health and mental problems that you have had for a long time. Things that hurt you in the past (trauma). Big changes in your life, such as divorce. What increases the risk? The following factors may make you more likely to develop this condition: Having family members with depression. Being a woman. Problems in the family. Low levels of some brain chemicals. Things that caused you pain as a child, especially if you lost a parent or were abused. A lot of stress in your life, such as from: Living without basic needs of life, such as food and shelter. Being treated poorly because of race, sex, or religion (discrimination). Health and mental problems that you have had for a long time. What are the signs or symptoms? The main symptoms of this condition are: Being sad all the time. Being grouchy all the time. Loss of interest in things and activities. Other symptoms include: Sleeping too much or too little. Eating too much or too little. Gaining or losing weight, without knowing why. Feeling tired or having low energy. Being restless and weak. Feeling hopeless, worthless, or guilty. Trouble thinking clearly or making decisions. Thoughts of hurting yourself or others, or thoughts of ending your life. Spending a lot of time alone. Inability to complete common tasks of daily life. If you have very bad MDD, you may: Believe things that are not true. Hear, see, taste, or feel things that are not there. Have mild depression that lasts for at least 2 years. Feel very sad and hopeless. Have trouble speaking or moving. How is this treated? This condition may be treated with: Talk therapy. This teaches you to know bad thoughts, feelings, and actions and how to change them. This can also help you to communicate with others. This can be done with members of your family. Medicines. These can be used to treat worry (anxiety),  depression, or low levels of chemicals in the brain. Lifestyle changes. You may need to: Limit alcohol use. Limit drug use. Get regular exercise. Get plenty of sleep. Make healthy eating choices. Spend more time outdoors. Brain stimulation. This treatment excites the brain. This is done when symptoms are very bad or have not gotten better with other treatments. Follow these instructions at home: Activity Get regular exercise as told. Spend time outdoors as told. Make time to do the things you enjoy. Find ways to deal with stress. Try to: Meditate. Do deep breathing. Spend time in nature. Keep a journal. Return to your normal activities as told by your doctor. Ask your doctor what activities are safe for you. Alcohol and drug use If you drink alcohol: Limit how much you use to: 0-1 drink a day for women. 0-2 drinks a day for men. Be aware of how much alcohol is in your drink. In the [place] one drink equals one 12 oz bottle of beer (355 mL), one 5 oz glass of wine (148 mL), or one 1 oz glass of hard liquor (44 mL). Talk to your doctor about: Alcohol use. Alcohol can affect some medicines. Any drug use. General instructions  Take over-the-counter and prescription medicines and herbal preparations only as told by your doctor. Eat a healthy diet. Get a lot of sleep. Think about joining a support group. Your doctor may be able to suggest one. Keep all follow-up visits as told by your doctor. This is important. Where to find more information: [employer] Mental Illness: www.nami.org [place] National  Institute of Mental Health: http://www.maynard.net/ American Psychiatric Association: www.psychiatry.org/patients-families/ Contact a doctor if: Your symptoms get worse. You get new symptoms. Get help right away if: You hurt yourself. You have serious thoughts about hurting yourself or others. You see, hear, taste, smell, or feel things that are not there. If you ever feel like you  may hurt yourself or others, or have thoughts about taking your own life, get help right away. Go to your nearest emergency department or: Call your local emergency services (911 in the [place] Call a suicide crisis helpline, such as the National Suicide Prevention Lifeline at (985)634-0921 or 988 in the [place] This is open 24 hours a day in the [place] Text the Crisis Text Line at (929)019-5087 (in the [place] Summary Major depressive disorder is a mental health condition. This disorder affects feelings. Symptoms of this condition last most of the day, almost every day, for 2 weeks. The symptoms of this disorder can cause problems with relationships and with daily activities. There are treatments and support for people who get this disorder. You may need more than one type of treatment. Get help right away if you have serious thoughts about hurting yourself or others. This information is not intended to replace advice given to you by your health care provider. Make sure you discuss any questions you have with your health care provider. Document Revised: 09/29/2020 Document Reviewed: 02/15/2019 Elsevier Patient Education  2022 [employer] Dysuria Dysuria is pain or discomfort during urination. The pain or discomfort may be felt in the part of the body that drains urine from the bladder (urethra) or in the surrounding tissue of the genitals. The pain may also be felt in the groin area, lower abdomen, or lower back. You may have to urinate frequently or have the sudden feeling that you have to urinate (urgency). Dysuria can affect anyone, but it is more common in females. Dysuria can be caused by many different things, including: Urinary tract infection. Kidney stones or bladder stones. Certain STIs (sexually transmitted infections), such as chlamydia. Dehydration. Inflammation of the tissues of the vagina. Use of certain medicines. Use of certain soaps or scented products that cause irritation. Follow  these instructions at home: Medicines Take over-the-counter and prescription medicines only as told by your health care provider. If you were prescribed an antibiotic medicine, take it as told by your health care provider. Do not stop taking the antibiotic even if you start to feel better. Eating and drinking  Drink enough fluid to keep your urine pale yellow. Avoid caffeinated beverages, tea, and alcohol. These beverages can irritate the bladder and make dysuria worse. In males, alcohol may irritate the prostate. General instructions Watch your condition for any changes. Urinate often. Avoid holding urine for long periods of time. If you are female, you should wipe from front to back after urinating or having a bowel movement. Use each piece of toilet paper only once. Empty your bladder after sex. Keep all follow-up visits. This is important. If you had any tests done to find the cause of dysuria, it is up to you to get your test results. Ask your health care provider, or the department that is doing the test, when your results will be ready. Contact a health care provider if: You have a fever. You develop pain in your back or sides. You have nausea or vomiting. You have blood in your urine. You are not urinating as often as you usually do. Get help right away if: Your  pain is severe and not relieved with medicines. You cannot eat or drink without vomiting. You are confused. You have a rapid heartbeat while resting. You have shaking or chills. You feel extremely weak. Summary Dysuria is pain or discomfort while urinating. Many different conditions can lead to dysuria. If you have dysuria, you may have to urinate frequently or have the sudden feeling that you have to urinate (urgency). Watch your condition for any changes. Keep all follow-up visits. Make sure that you urinate often and drink enough fluid to keep your urine pale yellow. This information is not intended to replace  advice given to you by your health care provider. Make sure you discuss any questions you have with your health care provider. Document Revised: 10/17/2019 Document Reviewed: 10/17/2019 Elsevier Patient Education  2022 [employer]
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
## 2021-04-26 NOTE — Assessment & Plan Note (Signed)
Depression not well controlled.  Started patient on Lexapro 10 mg tablet by mouth daily.  Follow-up in 6 weeks.  Completed PHQ-9.  Rx and pharmacy.

## 2021-04-26 NOTE — Assessment & Plan Note (Signed)
Anxiety not well controlled.  Patient is not currently taking any medication.  Started patient 1 hydroxyzine 10 mg tablet by mouth as needed for anxiety.  Complete the GAD-7.  Follow-up in 6 weeks.

## 2021-04-28 LAB — URINE CULTURE

## 2021-05-11 ENCOUNTER — Other Ambulatory Visit: Payer: Self-pay

## 2021-05-11 ENCOUNTER — Other Ambulatory Visit (HOSPITAL_COMMUNITY)
Admission: RE | Admit: 2021-05-11 | Discharge: 2021-05-11 | Disposition: A | Discharge status: Home / Self Care | Payer: Medicaid Other | Source: Ambulatory Visit | Attending: Obstetrics & Gynecology | Admitting: Obstetrics & Gynecology

## 2021-05-11 ENCOUNTER — Other Ambulatory Visit (INDEPENDENT_AMBULATORY_CARE_PROVIDER_SITE_OTHER): Payer: Medicaid Other

## 2021-05-11 DIAGNOSIS — N898 Other specified noninflammatory disorders of vagina: Secondary | ICD-10-CM | POA: Insufficient documentation

## 2021-05-11 NOTE — Progress Notes (Signed)
° °  NURSE VISIT- VAGINITIS/STD  SUBJECTIVE:  Tara Colon is a 31 y.o. [id number] GYN patientfemale here for a vaginal swab for vaginitis screening, STD screen.  She reports the following symptoms: discharge described as white, clear, and malodorous and vulvar itching for 1 week. Denies abnormal vaginal bleeding, significant pelvic pain, fever, or UTI symptoms.  OBJECTIVE:  LMP 04/12/2021   Appears well, in no apparent distress  ASSESSMENT: Vaginal swab for  vaginitis & STD screening  PLAN: Self-collected vaginal probe for Gonorrhea, Chlamydia, Trichomonas, Bacterial Vaginosis, Yeast sent to lab Treatment: to be determined once results are received Follow-up as needed if symptoms persist/worsen, or new symptoms develop  Tara Colon  05/11/2021 4:13 PM

## 2021-05-13 ENCOUNTER — Other Ambulatory Visit: Payer: Self-pay | Admitting: Advanced Practice Midwife

## 2021-05-13 LAB — CERVICOVAGINAL ANCILLARY ONLY
Bacterial Vaginitis (gardnerella): NEGATIVE
Candida Glabrata: NEGATIVE
Candida Vaginitis: POSITIVE — AB
Chlamydia: NEGATIVE
Comment: NEGATIVE
Comment: NEGATIVE
Comment: NEGATIVE
Comment: NEGATIVE
Comment: NEGATIVE
Comment: NORMAL
Neisseria Gonorrhea: NEGATIVE
Trichomonas: NEGATIVE

## 2021-05-13 MED ORDER — FLUCONAZOLE 150 MG PO TABS: 150.0000 mg | ORAL_TABLET | Freq: Once | ORAL | 0 refills | Status: AC

## 2021-05-26 ENCOUNTER — Encounter: Payer: Self-pay | Admitting: Family Medicine

## 2021-05-26 ENCOUNTER — Ambulatory Visit: Payer: Medicaid Other | Admitting: Family Medicine

## 2021-05-26 ENCOUNTER — Other Ambulatory Visit (HOSPITAL_COMMUNITY)
Admission: RE | Admit: 2021-05-26 | Discharge: 2021-05-26 | Disposition: A | Discharge status: Home / Self Care | Payer: Medicaid Other | Source: Ambulatory Visit | Attending: Family Medicine | Admitting: Family Medicine

## 2021-05-26 DIAGNOSIS — N898 Other specified noninflammatory disorders of vagina: Secondary | ICD-10-CM

## 2021-05-26 DIAGNOSIS — R300 Dysuria: Secondary | ICD-10-CM | POA: Diagnosis not present

## 2021-05-26 LAB — URINALYSIS, COMPLETE
Bilirubin, UA: NEGATIVE
Glucose, UA: NEGATIVE
Ketones, UA: NEGATIVE
Leukocytes,UA: NEGATIVE
Nitrite, UA: NEGATIVE
Specific Gravity, UA: >1.03 — ABNORMAL HIGH (ref 1.005–1.030)
Urobilinogen, Ur: 0.2 mg/dL (ref 0.2–1.0)
pH, UA: 5 (ref 5.0–7.5)

## 2021-05-26 LAB — MICROSCOPIC EXAMINATION
RBC, Urine: NONE SEEN /hpf (ref 0–2)
Renal Epithel, UA: NONE SEEN /hpf

## 2021-05-26 MED ORDER — FLUCONAZOLE 150 MG PO TABS: 150.0000 mg | ORAL_TABLET | Freq: Once | ORAL | 0 refills | Status: AC
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
## 2021-05-27 LAB — URINE CULTURE

## 2021-05-30 LAB — URINE CYTOLOGY ANCILLARY ONLY
Bacterial Vaginitis-Urine: NEGATIVE
Candida Urine: NEGATIVE
Chlamydia: NEGATIVE
Comment: NEGATIVE
Comment: NEGATIVE
Comment: NORMAL
Neisseria Gonorrhea: NEGATIVE
Trichomonas: NEGATIVE

## 2021-06-07 ENCOUNTER — Ambulatory Visit: Payer: Medicaid Other | Admitting: Nurse Practitioner

## 2021-06-30 DIAGNOSIS — Z880 Allergy status to penicillin: Secondary | ICD-10-CM | POA: Diagnosis not present

## 2021-06-30 DIAGNOSIS — Z882 Allergy status to sulfonamides status: Secondary | ICD-10-CM | POA: Diagnosis not present

## 2021-06-30 DIAGNOSIS — F17210 Nicotine dependence, cigarettes, uncomplicated: Secondary | ICD-10-CM | POA: Diagnosis not present

## 2021-06-30 DIAGNOSIS — Z7289 Other problems related to lifestyle: Secondary | ICD-10-CM | POA: Diagnosis not present

## 2021-06-30 DIAGNOSIS — M545 Low back pain, unspecified: Secondary | ICD-10-CM | POA: Diagnosis not present

## 2021-07-01 ENCOUNTER — Telehealth: Payer: Self-pay

## 2021-07-01 NOTE — Telephone Encounter (Signed)
Transition Care Management Unsuccessful Follow-up Telephone Call ? ?Date of discharge and from where:  06/30/2021-[name]  ? ?Attempts:  1st Attempt ? ?Reason for unsuccessful TCM follow-up call:  Left voice message ? ?  ?

## 2021-07-05 NOTE — Telephone Encounter (Signed)
Transition Care Management Unsuccessful Follow-up Telephone Call ? ?Date of discharge and from where:  06/30/2021-[facility]  ? ?Attempts:  2nd Attempt ? ?Reason for unsuccessful TCM follow-up call:  Left voice message ? ?  ?

## 2021-07-06 NOTE — Telephone Encounter (Signed)
Transition Care Management Unsuccessful Follow-up Telephone Call ? ?Date of discharge and from where:  06/30/2021-[facility]   ? ?Attempts:  3rd Attempt ? ?Reason for unsuccessful TCM follow-up call:  Left voice message ? ?  ?

## 2021-07-11 ENCOUNTER — Encounter: Payer: Self-pay | Admitting: Nurse Practitioner

## 2021-07-11 ENCOUNTER — Ambulatory Visit: Payer: Medicaid Other | Admitting: Nurse Practitioner

## 2021-07-11 VITALS — BP 96/60 | HR 70 | Temp 97.5°F | Resp 20 | Ht 68.0 in | Wt 230.0 lb

## 2021-07-11 DIAGNOSIS — M542 Cervicalgia: Secondary | ICD-10-CM | POA: Insufficient documentation

## 2021-07-11 DIAGNOSIS — M5489 Other dorsalgia: Secondary | ICD-10-CM | POA: Insufficient documentation

## 2021-07-11 MED ORDER — METHYLPREDNISOLONE ACETATE 40 MG/ML IJ SUSP
80.0000 mg | Freq: Once | INTRAMUSCULAR | Status: AC
  Administered 2021-07-11: 80 mg via INTRAMUSCULAR

## 2021-07-11 NOTE — Patient Instructions (Signed)
Cervical Radiculopathy  Cervical radiculopathy means that a nerve in the neck (a cervical nerve) is pinched or bruised. This can happen because of an injury to the cervical spine (vertebrae) in the neck, or as a normal part of getting older. This condition can cause pain or loss of feeling (numbness) that runs from your neck all the way down to your arm and fingers. Often, this condition gets better with rest. Treatment may be needed if the condition does not get better. What are the causes? A neck injury. A bulging disk in your spine. Sudden muscle tightening (muscle spasms). Tight muscles in your neck due to overuse. Arthritis. Breakdown in the bones and joints of the spine (spondylosis) due to getting older. Bone spurs that form near the nerves in the neck. What are the signs or symptoms? Pain. The pain may: Run from the neck to the arm and hand. Be very bad or irritating. Get worse when you move your neck. Loss of feeling or tingling in your arm or hand. Weakness in your arm or hand, in very bad cases. How is this treated? In many cases, treatment is not needed for this condition. With rest, the condition often gets better over time. If treatment is needed, options may include: Wearing a soft neck collar (cervical collar) for short periods of time. Doing exercises (physical therapy) to strengthen your neck muscles. Taking medicines. Having shots (injections) in your spine, in very bad cases. Having surgery. This may be needed if other treatments do not help. The type of surgery that is used will depend on the cause of your condition. Follow these instructions at home: If you have a soft neck collar: Wear it as told by your doctor. Take it off only as told by your doctor. Ask your doctor if you can take the collar off for cleaning and bathing. If you are allowed to take the collar off for cleaning or bathing: Follow instructions from your doctor about how to take off the collar  safely. Clean the collar by wiping it with mild soap and water and drying it completely. Take out any removable pads in the collar every 1-2 days. Wash them by hand with soap and water. Let them air-dry completely before you put them back in the collar. Check your skin under the collar for redness or sores. If you see any, tell your doctor. Managing pain     Take over-the-counter and prescription medicines only as told by your doctor. If told, put ice on the painful area. To do this: If you have a soft neck collar, take if off as told by your doctor. Put ice in a plastic bag. Place a towel between your skin and the bag. Leave the ice on for 20 minutes, 2-3 times a day. Take off the ice if your skin turns bright red. This is very important. If you cannot feel pain, heat, or cold, you have a greater risk of damage to the area. If using ice does not help, you can try using heat. Use the heat source that your doctor recommends, such as a moist heat pack or a heating pad. Place a towel between your skin and the heat source. Leave the heat on for 20-30 minutes. Take off the heat if your skin turns bright red. This is very important. If you cannot feel pain, heat, or cold, you have a greater risk of getting burned. You may try a gentle neck and shoulder rub (massage). Activity Rest as needed. Return   to your normal activities when your doctor says that it is safe. ?Do exercises as told by your doctor or physical therapist. ?You may have to avoid lifting. Ask your doctor how much you can safely lift. ?General instructions ?Use a flat pillow when you sleep. ?Do not drive while wearing a soft neck collar. If you do not have a soft neck collar, ask your doctor if it is safe to drive while your neck heals. ?Ask your doctor if you should avoid driving or using machines while you are taking your medicine. ?Do not smoke or use any products that contain nicotine or tobacco. If you need help quitting, ask your  doctor. ?Keep all follow-up visits. ?Contact a doctor if: ?Your condition does not get better with treatment. ?Get help right away if: ?Your pain gets worse and medicine does not help. ?You lose feeling or feel weak in your hand, arm, face, or leg. ?You have a high fever. ?Your neck is stiff. ?You cannot control when you poop or pee (have incontinence). ?You have trouble with walking, balance, or talking. ?Summary ?Cervical radiculopathy means that a nerve in the neck is pinched or bruised. ?A nerve can get pinched from a bulging disk, arthritis, an injury to the neck, or other causes. ?Symptoms include pain, tingling, or loss of feeling that goes from the neck to the arm or hand. ?Weakness in your arm or hand can happen in very bad cases. ?Treatment may include resting, wearing a soft neck collar, and doing exercises. You might need to take medicines for pain. In very bad cases, shots or surgery may be needed. ?This information is not intended to replace advice given to you by your health care provider. Make sure you discuss any questions you have with your health care provider. ?Document Revised: 09/09/2020 Document Reviewed: 09/09/2020 ?Elsevier Patient Education ? 2023 [employer] ?Acute Back Pain, Adult ?Acute back pain is sudden and usually short-lived. It is often caused by an injury to the muscles and tissues in the back. The injury may result from: ?A muscle, tendon, or ligament getting overstretched or torn. Ligaments are tissues that connect bones to each other. Lifting something improperly can cause a back strain. ?Wear and tear (degeneration) of the spinal disks. Spinal disks are circular tissue that provide cushioning between the bones of the spine (vertebrae). ?Twisting motions, such as while playing sports or doing yard work. ?A hit to the back. ?Arthritis. ?You may have a physical exam, lab tests, and imaging tests to find the cause of your pain. Acute back pain usually goes away with rest and  home care. ?Follow these instructions at home: ?Managing pain, stiffness, and swelling ?Take over-the-counter and prescription medicines only as told by your health care provider. Treatment may include medicines for pain and inflammation that are taken by mouth or applied to the skin, or muscle relaxants. ?Your health care provider may recommend applying ice during the first 24-48 hours after your pain starts. To do this: ?Put ice in a plastic bag. ?Place a towel between your skin and the bag. ?Leave the ice on for 20 minutes, 2-3 times a day. ?Remove the ice if your skin turns bright red. This is very important. If you cannot feel pain, heat, or cold, you have a greater risk of damage to the area. ?If directed, apply heat to the affected area as often as told by your health care provider. Use the heat source that your health care provider recommends, such as a moist heat  pack or a heating pad. ?Place a towel between your skin and the heat source. ?Leave the heat on for 20-30 minutes. ?Remove the heat if your skin turns bright red. This is especially important if you are unable to feel pain, heat, or cold. You have a greater risk of getting burned. ?Activity ? ?Do not stay in bed. Staying in bed for more than 1-2 days can delay your recovery. ?Sit up and stand up straight. Avoid leaning forward when you sit or hunching over when you stand. ?If you work at a desk, sit close to it so you do not need to lean over. Keep your chin tucked in. Keep your neck drawn back, and keep your elbows bent at a 90-degree angle (right angle). ?Sit high and close to the steering wheel when you drive. Add lower back (lumbar) support to your car seat, if needed. ?Take short walks on even surfaces as soon as you are able. Try to increase the length of time you walk each day. ?Do not sit, drive, or stand in one place for more than 30 minutes at a time. Sitting or standing for long periods of time can put stress on your back. ?Do not drive  or use heavy machinery while taking prescription pain medicine. ?Use proper lifting techniques. When you bend and lift, use positions that put less stress on your back: ?Perkins your knees. ?Keep the load close to
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
## 2021-07-21 ENCOUNTER — Ambulatory Visit: Payer: Medicaid Other | Attending: Nurse Practitioner | Admitting: Physical Therapy

## 2021-07-21 ENCOUNTER — Encounter: Payer: Self-pay | Admitting: Physical Therapy

## 2021-07-21 DIAGNOSIS — M542 Cervicalgia: Secondary | ICD-10-CM | POA: Insufficient documentation

## 2021-07-21 DIAGNOSIS — M5489 Other dorsalgia: Secondary | ICD-10-CM | POA: Insufficient documentation

## 2021-07-21 DIAGNOSIS — M546 Pain in thoracic spine: Secondary | ICD-10-CM | POA: Insufficient documentation
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
## 2021-07-28 ENCOUNTER — Other Ambulatory Visit (HOSPITAL_COMMUNITY)
Admission: RE | Admit: 2021-07-28 | Discharge: 2021-07-28 | Disposition: A | Discharge status: Home / Self Care | Payer: Medicaid Other | Source: Ambulatory Visit | Attending: Adult Health | Admitting: Adult Health

## 2021-07-28 ENCOUNTER — Encounter: Payer: Self-pay | Admitting: Adult Health

## 2021-07-28 ENCOUNTER — Ambulatory Visit (INDEPENDENT_AMBULATORY_CARE_PROVIDER_SITE_OTHER): Payer: Medicaid Other | Admitting: Adult Health

## 2021-07-28 VITALS — BP 115/75 | HR 77 | Ht 68.0 in | Wt 233.0 lb

## 2021-07-28 DIAGNOSIS — Z0000 Encounter for general adult medical examination without abnormal findings: Secondary | ICD-10-CM

## 2021-07-28 DIAGNOSIS — Z01419 Encounter for gynecological examination (general) (routine) without abnormal findings: Secondary | ICD-10-CM | POA: Insufficient documentation

## 2021-07-28 DIAGNOSIS — Z30011 Encounter for initial prescription of contraceptive pills: Secondary | ICD-10-CM | POA: Diagnosis not present

## 2021-07-28 DIAGNOSIS — R350 Frequency of micturition: Secondary | ICD-10-CM

## 2021-07-28 DIAGNOSIS — N898 Other specified noninflammatory disorders of vagina: Secondary | ICD-10-CM | POA: Insufficient documentation

## 2021-07-28 DIAGNOSIS — Z113 Encounter for screening for infections with a predominantly sexual mode of transmission: Secondary | ICD-10-CM

## 2021-07-28 LAB — POCT URINALYSIS DIPSTICK OB
Glucose, UA: NEGATIVE
Ketones, UA: NEGATIVE
Leukocytes, UA: NEGATIVE
Nitrite, UA: NEGATIVE
POC,PROTEIN,UA: NEGATIVE

## 2021-07-28 MED ORDER — LO LOESTRIN FE 1 MG-10 MCG / 10 MCG PO TABS: 1.0000 | ORAL_TABLET | Freq: Every day | ORAL | 11 refills | Status: DC

## 2021-07-28 NOTE — Progress Notes (Signed)
Patient ID: Tara Colon, female   DOB: [birth year], 31 y.o.   MRN: [id number] ?History of Present Illness: ?Tara Colon is a 31 year old black female,single, G2P1102 in for a well woman gyn exam. Has noticed some urinary frequency and discharge,feels irritated. She requests STD testing and wants to get on OCs. ?PCP is [facility]. [facility].  ? ?Lab Results  ?Component Value Date  ? DIAGPAP  07/21/2020  ?  - Negative for intraepithelial lesion or malignancy (NILM)  ? HPVHIGH Negative 07/21/2020  ?  ?Current Medications, Allergies, Past Medical History, Past Surgical History, Family History and Social History were reviewed in [name] record.   ? ? ?Review of Systems: ?Patient denies any headaches, hearing loss, fatigue, blurred vision, shortness of breath, chest pain, abdominal pain, problems with bowel movements, or intercourse. No joint pain or mood swings.  ?See HPI for positives. ? ? ?Physical Exam:BP 115/75 (BP Location: Right Arm, Patient Position: Sitting, Cuff Size: Normal)   Pulse 77   Ht 5\' 8"  (1.727 m)   Wt 233 lb (105.7 kg)   LMP 07/09/2021 (Exact Date)   BMI 35.43 kg/m?  urine dipstick +blood ?General:  Well developed, well nourished, no acute distress ?Skin:  Warm and dry ?Neck:  Midline trachea, normal thyroid, good ROM, no lymphadenopathy ?Lungs; Clear to auscultation bilaterally ?Breast:  No dominant palpable mass, retraction, or nipple discharge,inverted left nipple, has always been like that ?Cardiovascular: Regular rate and rhythm ?Abdomen:  Soft, non tender, no hepatosplenomegaly ?Pelvic:  External genitalia is normal in appearance, no lesions.  The vagina is normal in appearance, has white discharge with slight odor. Urethra has no lesions or masses. The cervix is bulbous.  Uterus is felt to be normal size, shape, and contour.  No adnexal masses or tenderness noted.Bladder is non tender, no masses felt.CV swab obtained. ?Extremities/musculoskeletal:  No swelling or varicosities  noted, no clubbing or cyanosis ?Psych:  No mood changes, alert and cooperative,seems happy ?AA is 1 ?Fall risk is low ? ?  07/28/2021  ? 11:43 AM 07/11/2021  ? 10:28 AM 04/26/2021  ?  2:08 PM  ?Depression screen PHQ 2/9  ?Decreased Interest 1 0 1  ?Down, Depressed, Hopeless 3 0 3  ?PHQ - 2 Score 4 0 4  ?Altered sleeping 2  2  ?Tired, decreased energy 3  3  ?Change in appetite 3  1  ?Feeling bad or failure about yourself  3  1  ?Trouble concentrating 0  0  ?Moving slowly or fidgety/restless 0  0  ?Suicidal thoughts 0  2  ?PHQ-9 Score 15  13  ?Difficult doing work/chores   Extremely dIfficult  ? She is on lexapro and has vistaril from PCP ? ?  07/28/2021  ? 11:44 AM 04/26/2021  ?  2:10 PM 04/01/2021  ? 10:40 AM 01/20/2021  ?  8:12 AM  ?GAD 7 : Generalized Anxiety Score  ?Nervous, Anxious, on Edge 3 3 3  0  ?Control/stop worrying 3 3 2 1   ?Worry too much - different things 3 3 2  0  ?Trouble relaxing 3 1 0 0  ?Restless 0 0 0 0  ?Easily annoyed or irritable 3 3 3 1   ?Afraid - awful might happen 2 0 0 0  ?Total GAD 7 Score 17 13 10 2   ?Anxiety Difficulty  Extremely difficult Somewhat difficult Not difficult at all  ? ? [place] - 07/28/21 1143   ? ?  ? Pregnancy Intention Screening  ? Does the patient want to  become pregnant in the next year? No   ? Does the patient's partner want to become pregnant in the next year? No   ? Would the patient like to discuss contraceptive options today? Yes   ?  ? Contraception Wrap Up  ? Current Method Abstinence   ? End Method Oral Contraceptive   ? Contraception Counseling Provided No   ? ?  ?  ? ?  ? Examination chaperoned by Faith Rogue LPN ? ?  ?  ?Impression and Plan: ?1. Urinary frequency ?- POC Urinalysis Dipstick OB ?- Urine Culture ?- Urinalysis ? ?2. Vaginal discharge ?CV swab sent  ?- Cervicovaginal ancillary only( [place]) ? ?3. Screen for STD (sexually transmitted disease) ?CV swab sent for GC/CHL,trich, BV and yeast  ?- Cervicovaginal ancillary only( [place]) ?Will check  labs ?- Hepatitis C antibody ?- Hepatitis B surface antigen ?- HIV Antibody (routine testing w rflx) ?- RPR ? ?4. Encounter for well woman exam with routine gynecological exam ?Physical in 1 year ?Pap 2025 ? ?5. Encounter for initial prescription of contraceptive pills ?Will rx lo Loestrin, can start now and with next period, use condoms for 1 pack ?Follow up in 3 months for ROS ? ? ? ?  ?  ?

## 2021-07-29 ENCOUNTER — Encounter: Payer: Self-pay | Admitting: Physical Therapy

## 2021-07-29 ENCOUNTER — Ambulatory Visit: Payer: Medicaid Other | Admitting: Physical Therapy

## 2021-07-29 DIAGNOSIS — M5489 Other dorsalgia: Secondary | ICD-10-CM | POA: Diagnosis not present

## 2021-07-29 DIAGNOSIS — M542 Cervicalgia: Secondary | ICD-10-CM | POA: Diagnosis not present

## 2021-07-29 DIAGNOSIS — M546 Pain in thoracic spine: Secondary | ICD-10-CM

## 2021-07-29 LAB — HIV ANTIBODY (ROUTINE TESTING W REFLEX): HIV Screen 4th Generation wRfx: NONREACTIVE

## 2021-07-29 LAB — URINALYSIS
Bilirubin, UA: NEGATIVE
Glucose, UA: NEGATIVE
Ketones, UA: NEGATIVE
Leukocytes,UA: NEGATIVE
Nitrite, UA: NEGATIVE
Specific Gravity, UA: >=1.03 — AB (ref 1.005–1.030)
Urobilinogen, Ur: 0.2 mg/dL (ref 0.2–1.0)
pH, UA: 6 (ref 5.0–7.5)

## 2021-07-29 LAB — RPR: RPR Ser Ql: NONREACTIVE

## 2021-07-29 LAB — HEPATITIS C ANTIBODY: Hep C Virus Ab: NONREACTIVE

## 2021-07-29 LAB — HEPATITIS B SURFACE ANTIGEN: Hepatitis B Surface Ag: NEGATIVE

## 2021-07-29 NOTE — Therapy (Signed)
[place] ?[facility] ?[address] ?[place], [place], [address] ?Phone: [phone]   Fax:  [phone] ? ?Physical Therapy Treatment ? ?Patient Details  ?Name: Tara Colon ?MRN: [id number] ?Date of Birth: 27-Nov-1990 ?Referring Provider (PT): Lynder Parents ? ? ?Encounter Date: 07/29/2021 ? ? PT End of Session - 07/29/21 0859   ? ? Visit Number 2   ? Number of Visits 10   ? Date for PT Re-Evaluation 08/25/21   ? PT Start Time 0900   ? PT Stop Time [id number]   ? PT Time Calculation (min) 33 min   ? Activity Tolerance Patient tolerated treatment well   ? Behavior During Therapy [facility] for tasks assessed/performed   ? ?  ?  ? ?  ? ? ?Past Medical History:  ?Diagnosis Date  ? Anxiety   ? Bipolar affect, depressed ([place])   ? BV (bacterial vaginosis)   ? Depression   ? HSV (herpes simplex virus) infection   ? pt reports no outbreaks  ? Insomnia   ? ? ?Past Surgical History:  ?Procedure Laterality Date  ? NO PAST SURGERIES    ? ? ?There were no vitals filed for this visit. ? ? Subjective Assessment - 07/29/21 0858   ? ? Subjective Reports having pain. Able to sleep for two consecutive hours.   ? Pertinent History Bi-Polar.   ? How long can you stand comfortably? ~5 minutes.   ? Diagnostic tests X-ray.   ? Patient Stated Goals Get out of pain.   ? Currently in Pain? Yes   ? Pain Score 6    ? Pain Location Back   ? Pain Orientation Mid   ? Pain Descriptors / Indicators Discomfort   ? Pain Type Acute pain   ? Pain Onset 1 to 4 weeks ago   ? Pain Frequency Constant   ? ?  ?  ? ?  ? ? ? ? ? [facility] PT Assessment - 07/29/21 0001   ? ?  ? Assessment  ? Medical Diagnosis Back pain without Sciatica.   ? Referring Provider (PT) Lynder Parents   ? Onset Date/Surgical Date 06/30/21   ?  ? Precautions  ? Precautions None   ?  ? Restrictions  ? Weight Bearing Restrictions No   ? ?  ?  ? ?  ? ? ? ? ? ? ? ? ? ? ? ? ? ? ? ? [facility] Adult PT Treatment/Exercise - 07/29/21 0001   ? ?  ? Exercises  ? Exercises Shoulder;Neck   ?   ? Neck Exercises: Machines for Strengthening  ? UBE (Upper Arm Bike) 90 RPM x6 min (forward/backward)   ?  ? Neck Exercises: Theraband  ? Scapula Retraction 20 reps;Limitations   ? Scapula Retraction Limitations yellow   ? Shoulder Extension 20 reps;Limitations   ? Shoulder Extension Limitations yellow   ? Shoulder External Rotation 15 reps;Limitations   ? Shoulder External Rotation Limitations yellow   ? Horizontal ABduction 20 reps;Limitations   ? Horizontal ABduction Limitations yellow   ?  ? Neck Exercises: Standing  ? Wall Push Ups 10 reps   ?  ? Neck Exercises: Seated  ? W Back 15 reps   ? Shoulder Shrugs 20 reps   ? Shoulder Rolls Backwards;20 reps   ? Other Seated Exercise scap retraction x20 reps   ?  ? Neck Exercises: Supine  ? Other Supine Exercise snow angels x20 reps   ?  ? Manual Therapy  ? Manual  Therapy Soft tissue mobilization   ? Soft tissue mobilization Light STW to B scapular retractors, levator scapula, UTs   ? ?  ?  ? ?  ? ? ? ? ? ? ? ? ? ? ? ? ? ? ? PT Long Term Goals - 07/29/21 [id number]   ? ?  ? PT LONG TERM GOAL #1  ? Title Independent with a HEP.   ? Baseline No knowledge of appropriate ther ex.   ? Time 5   ? Period Weeks   ? Status On-going   ?  ? PT LONG TERM GOAL #2  ? Title Sleep undisturbed 6 hours.   ? Baseline Patient cannot sleep comfortably due to pain.   ? Time 5   ? Period Weeks   ? Status On-going   ?  ? PT LONG TERM GOAL #3  ? Title Stand 20 minutes with pain not > 2-3/10.   ? Baseline Patient able to stand only about 5 minutes.   ? Time 5   ? Period Weeks   ? Status On-going   ? ?  ?  ? ?  ? ? ? ? ? ? ? ? Plan - 07/29/21 0936   ? ? Clinical Impression Statement Patient presented in clinic with 6/10 thoracic pain which limits ADLs as well as standing and driving. Patient guided through light postural strengthening with therex with reports of "feeling it." Patient instructed in proper posture and technique. Patient fatigued quickly with all exercise. Patient especially sensitive  to STW of scapular retractors along B sides but more L sided symptoms. Patient educated regarding heating pad twice daily for 15-20 minutes max.   ? Personal Factors and Comorbidities Other   ? Examination-Activity Limitations Stand;Sleep   ? Stability/Clinical Decision Making Stable/Uncomplicated   ? Rehab Potential Excellent   ? PT Frequency 2x / week   ? PT Treatment/Interventions Manual techniques;Therapeutic activities;Therapeutic exercise   ? PT Next Visit Plan STW/M, scapular exercises.   ? Consulted and Agree with Plan of Care Patient   ? ?  ?  ? ?  ? ? ?Patient will benefit from skilled therapeutic intervention in order to improve the following deficits and impairments:  Increased muscle spasms, Decreased activity tolerance, Pain ? ?Visit Diagnosis: ?Pain in thoracic spine ? ? ? ? ?Problem List ?Patient Active Problem List  ? Diagnosis Date Noted  ? Encounter for well woman exam with routine gynecological exam 07/28/2021  ? Urinary frequency 07/28/2021  ? Screen for STD (sexually transmitted disease) 07/28/2021  ? Encounter for initial prescription of contraceptive pills 07/28/2021  ? Back pain without sciatica 07/11/2021  ? Neck pain 07/11/2021  ? Alcohol use, 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[place]) 04/01/2021  ? Vesicles 02/03/2021  ? Encounter for follow-up 01/05/2021  ? Encounter for initial prescription of vaginal ring hormonal contraceptive 09/28/2020  ? Pregnancy test negative 09/28/2020  ? Encounter for gynecological examination with Papanicolaou smear of cervix 07/21/2020  ? Screening examination for STD (sexually transmitted disease) 07/21/2020  ? Routine general medical examination at a health care facility 07/21/2020  ? Folliculitis [id number]  ? Recurrent boils 06/23/2020  ? Paresthesia of both legs 06/08/2020  ? Acute bilateral low back pain without sciatica 03/29/2020  ? Anxiety 02/23/2020  ? Need for immunization against influenza 02/23/2020  ? Other chest pain 02/23/2020   ? Alopecia 01/29/2020  ? Encounter for female birth control 01/29/2020  ? BV (bacterial vaginosis) 08/06/2019  ? Vaginal discharge 08/06/2019  ? Vaginal  irritation 08/06/2019  ? Itching of vulva 08/06/2019  ? History of preterm delivery 12/14/2018  ? Proteinuria 12/06/2018  ? 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 Smoker 07/16/2018  ? Depression with anxiety 07/16/2018  ? Encounter for smoking cessation counseling 05/22/2018  ? PTSD (post-traumatic stress disorder) 05/03/2017  ? 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 ? ?Standley Brooking, PTA ?07/29/2021, 10:18 AM ? ?[place] ?[facility] ?[address] ?[place], [place], [address] ?Phone: [phone]   Fax:  [phone] ? ?Name: Tara Colon ?MRN: [id number] ?Date of Birth: 1990/04/05 ? ? ? ?

## 2021-07-30 LAB — URINE CULTURE: Organism ID, Bacteria: NO GROWTH

## 2021-08-01 LAB — CERVICOVAGINAL ANCILLARY ONLY
Bacterial Vaginitis (gardnerella): POSITIVE — AB
Candida Glabrata: NEGATIVE
Candida Vaginitis: NEGATIVE
Chlamydia: NEGATIVE
Comment: NEGATIVE
Comment: NEGATIVE
Comment: NEGATIVE
Comment: NEGATIVE
Comment: NEGATIVE
Comment: NORMAL
Neisseria Gonorrhea: NEGATIVE
Trichomonas: NEGATIVE

## 2021-08-02 ENCOUNTER — Other Ambulatory Visit: Payer: Self-pay | Admitting: Adult Health

## 2021-08-02 MED ORDER — METRONIDAZOLE 500 MG PO TABS: 500.0000 mg | ORAL_TABLET | Freq: Two times a day (BID) | ORAL | 0 refills | Status: DC

## 2021-08-02 NOTE — Progress Notes (Signed)
+  BV on vaginal swab, rx sent for flagyl, no sex or alcohol during treatment  ?

## 2021-08-03 ENCOUNTER — Ambulatory Visit: Payer: Medicaid Other

## 2021-08-03 DIAGNOSIS — M546 Pain in thoracic spine: Secondary | ICD-10-CM

## 2021-08-03 DIAGNOSIS — M542 Cervicalgia: Secondary | ICD-10-CM | POA: Diagnosis not present

## 2021-08-03 DIAGNOSIS — M5489 Other dorsalgia: Secondary | ICD-10-CM | POA: Diagnosis not present

## 2021-08-03 NOTE — Therapy (Signed)
[place] ?[facility] ?[address] ?[place], [place], [address] ?Phone: [phone]   Fax:  [phone] ? ?Physical Therapy Treatment ? ?Patient Details  ?Name: Tara Colon ?MRN: [id number] ?Date of Birth: Jan 22, 1991 ?Referring Provider (PT): Lyla Glassing ? ? ?Encounter Date: 08/03/2021 ? ? PT End of Session - 08/03/21 1305   ? ? Visit Number 3   ? Number of Visits 10   ? Date for PT Re-Evaluation 08/25/21   ? PT Start Time 1300   ? PT Stop Time 1349   ? PT Time Calculation (min) 49 min   ? Activity Tolerance Patient tolerated treatment well   ? Behavior During Therapy [facility] for tasks assessed/performed   ? ?  ?  ? ?  ? ? ?Past Medical History:  ?Diagnosis Date  ? Anxiety   ? Bipolar affect, depressed ([facility])   ? BV (bacterial vaginosis)   ? Depression   ? HSV (herpes simplex virus) infection   ? pt reports no outbreaks  ? Insomnia   ? ? ?Past Surgical History:  ?Procedure Laterality Date  ? NO PAST SURGERIES    ? ? ?There were no vitals filed for this visit. ? ? Subjective Assessment - 08/03/21 1304   ? ? Subjective Pt arrives for today's treatment session reporting 8/10 neck and shoulder pain.  Pt declined STW today.   ? Pertinent History Bi-Polar.   ? How long can you stand comfortably? ~5 minutes.   ? Diagnostic tests X-ray.   ? Patient Stated Goals Get out of pain.   ? Currently in Pain? Yes   ? Pain Score 8    ? Pain Location Back   ? Pain Onset 1 to 4 weeks ago   ? ?  ?  ? ?  ? ? ? ? ? ? ? ? ? ? ? ? ? ? ? ? ? ? ? ? [facility] Adult PT Treatment/Exercise - 08/03/21 0001   ? ?  ? Neck Exercises: Machines for Strengthening  ? UBE (Upper Arm Bike) 90 RPM x 8 mins (4 forward/4 backward)   ? Nustep Lvl 3 x 15 mins   ?  ? Neck Exercises: Theraband  ? Shoulder Extension 20 reps   ? Shoulder Extension Limitations yellow   ? Rows 20 reps   ? Rows Limitations yellow   ? Shoulder External Rotation 20 reps   ? Shoulder External Rotation Limitations yellow   ? Horizontal ABduction 20 reps   ?  Horizontal ABduction Limitations yellow   ?  ? Neck Exercises: Standing  ? Wall Push Ups 15 reps   ?  ? Neck Exercises: Stretches  ? Other Neck Stretches Door Stretch 10 reps x 10 sec hold; shoulder flexion stretch on lat pull 10 reps x 10 sec hold   ? Other Neck Stretches Cross chest stretch B, 10 reps x 3 sec hold   ? ?  ?  ? ?  ? ? ? ? ? ? ? ? ? ? ? ? ? ? ? PT Long Term Goals - 07/29/21 0922   ? ?  ? PT LONG TERM GOAL #1  ? Title Independent with a HEP.   ? Baseline No knowledge of appropriate ther ex.   ? Time 5   ? Period Weeks   ? Status On-going   ?  ? PT LONG TERM GOAL #2  ? Title Sleep undisturbed 6 hours.   ? Baseline Patient cannot sleep comfortably due to pain.   ?  Time 5   ? Period Weeks   ? Status On-going   ?  ? PT LONG TERM GOAL #3  ? Title Stand 20 minutes with pain not > 2-3/10.   ? Baseline Patient able to stand only about 5 minutes.   ? Time 5   ? Period Weeks   ? Status On-going   ? ?  ?  ? ?  ? ? ? ? ? ? ? ? Plan - 08/03/21 1306   ? ? Clinical Impression Statement Pt arrives for today's treatment session reporting 8/10 cervical and thoracic back pain.  Pt instructed in several stretches to decrease pain and tone in cervical and thoracic spine area.  Pt declined STW today due to increased pain after last treatment session. Pt reported slight decrease in pain and tone after today's treatment session.   ? Personal Factors and Comorbidities Other   ? Examination-Activity Limitations Stand;Sleep   ? Stability/Clinical Decision Making Stable/Uncomplicated   ? Rehab Potential Excellent   ? PT Frequency 2x / week   ? PT Treatment/Interventions Manual techniques;Therapeutic activities;Therapeutic exercise   ? PT Next Visit Plan STW/M, scapular exercises.   ? Consulted and Agree with Plan of Care Patient   ? ?  ?  ? ?  ? ? ?Patient will benefit from skilled therapeutic intervention in order to improve the following deficits and impairments:  Increased muscle spasms, Decreased activity tolerance,  Pain ? ?Visit Diagnosis: ?Pain in thoracic spine ? ? ? ? ?Problem List ?Patient Active Problem List  ? Diagnosis Date Noted  ? Encounter for well woman exam with routine gynecological exam 07/28/2021  ? Urinary frequency 07/28/2021  ? Screen for STD (sexually transmitted disease) 07/28/2021  ? Encounter for initial prescription of contraceptive pills 07/28/2021  ? Back pain without sciatica 07/11/2021  ? Neck pain 07/11/2021  ? Alcohol use, 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[facility]) 04/01/2021  ? Vesicles 02/03/2021  ? Encounter for follow-up 01/05/2021  ? Encounter for initial prescription of vaginal ring hormonal contraceptive 09/28/2020  ? Pregnancy test negative 09/28/2020  ? Encounter for gynecological examination with Papanicolaou smear of cervix 07/21/2020  ? Screening examination for STD (sexually transmitted disease) 07/21/2020  ? Routine general medical examination at a health care facility 07/21/2020  ? Folliculitis 06/23/2020  ? Recurrent boils 06/23/2020  ? Paresthesia of both legs 06/08/2020  ? Acute bilateral low back pain without sciatica 03/29/2020  ? Anxiety 02/23/2020  ? Need for immunization against influenza 02/23/2020  ? Other chest pain 02/23/2020  ? Alopecia 01/29/2020  ? Encounter for female birth control 01/29/2020  ? BV (bacterial vaginosis) 08/06/2019  ? Vaginal discharge 08/06/2019  ? Vaginal irritation 08/06/2019  ? Itching of vulva 08/06/2019  ? History of preterm delivery 12/14/2018  ? Proteinuria 12/06/2018  ? 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 Smoker 07/16/2018  ? Depression with anxiety 07/16/2018  ? Encounter for smoking cessation counseling 05/22/2018  ? PTSD (post-traumatic stress disorder) 05/03/2017  ? 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 ? ?Newman Pies, PTA ?08/03/2021, 1:51 PM ? ?[place] ?[facility] ?[address] ?[place], [place], [address] ?Phone: [phone]    Fax:  [phone] ? ?Name: Tara Colon ?MRN: [id number] ?Date of Birth: Nov 18, 1990 ? ? ? ?

## 2021-08-10 ENCOUNTER — Ambulatory Visit: Payer: Medicaid Other

## 2021-08-10 DIAGNOSIS — M546 Pain in thoracic spine: Secondary | ICD-10-CM

## 2021-08-10 DIAGNOSIS — M5489 Other dorsalgia: Secondary | ICD-10-CM | POA: Diagnosis not present

## 2021-08-10 DIAGNOSIS — M542 Cervicalgia: Secondary | ICD-10-CM | POA: Diagnosis not present

## 2021-08-10 NOTE — Therapy (Signed)
[facility] [address] [place], [place], [address] Phone: [phone]   Fax:  [phone]  Physical Therapy Treatment  Patient Details  Name: Tara Colon MRN: [id number] Date of Birth: 01-Feb-1991 Referring Provider (PT): Lyla Glassing   Encounter Date: 08/10/2021   PT End of Session - 08/10/21 1305     Visit Number 4    Number of Visits 10    Date for PT Re-Evaluation 08/25/21    PT Start Time 1300    PT Stop Time 1346    PT Time Calculation (min) 46 min    Activity Tolerance Patient tolerated treatment well    Behavior During Therapy [facility] for tasks assessed/performed             Past Medical History:  Diagnosis Date   Anxiety    Bipolar affect, depressed ([facility])    BV (bacterial vaginosis)    Depression    HSV (herpes simplex virus) infection    pt reports no outbreaks   Insomnia     Past Surgical History:  Procedure Laterality Date   NO PAST SURGERIES      There were no vitals filed for this visit.   Subjective Assessment - 08/10/21 1304     Subjective Pt arrives for today's treatment session reporting 6/10 neck and shoulder pain.  Pt continues to decline STW due to discomfort.    Pertinent History Bi-Polar.    How long can you stand comfortably? ~5 minutes.    Diagnostic tests X-ray.    Patient Stated Goals Get out of pain.    Currently in Pain? Yes    Pain Score 6     Pain Location Neck    Pain Orientation Mid    Pain Onset 1 to 4 weeks ago                               [facility] Adult PT Treatment/Exercise - 08/10/21 0001       Exercises   Exercises Shoulder;Neck      Neck Exercises: Machines for Strengthening   UBE (Upper Arm Bike) 90 RPM x 8 mins (4 mins forward/backward)    Nustep Lvl 3 x 20 mins      Manual Therapy   Manual Therapy Manual Traction    Manual Traction In supine, holding occiput with gentle pull and hold                          PT Long  Term Goals - 07/29/21 6203       PT LONG TERM GOAL #1   Title Independent with a HEP.    Baseline No knowledge of appropriate ther ex.    Time 5    Period Weeks    Status On-going      PT LONG TERM GOAL #2   Title Sleep undisturbed 6 hours.    Baseline Patient cannot sleep comfortably due to pain.    Time 5    Period Weeks    Status On-going      PT LONG TERM GOAL #3   Title Stand 20 minutes with pain not > 2-3/10.    Baseline Patient able to stand only about 5 minutes.    Time 5    Period Weeks    Status On-going                   Plan -  08/10/21 1306     Clinical Impression Statement Pt arrives for today's treatment session reporting 6/10 cervical and thoracic back pain.  Pt reports that her head feels heavy and she feels as if she needs a "good stretch."  Manual therapy performed with pt positioned in supine with hold on occiput and gentle pull and hold to decrease pain and tone.  Pt reported 4/10 cervical and thoracic back pain at completion of today's treatment session.    Personal Factors and Comorbidities Other    Examination-Activity Limitations Stand;Sleep    Stability/Clinical Decision Making Stable/Uncomplicated    Rehab Potential Excellent    PT Frequency 2x / week    PT Treatment/Interventions Manual techniques;Therapeutic activities;Therapeutic exercise    PT Next Visit Plan STW/M, scapular exercises.    Consulted and Agree with Plan of Care Patient             Patient will benefit from skilled therapeutic intervention in order to improve the following deficits and impairments:  Increased muscle spasms, Decreased activity tolerance, Pain  Visit Diagnosis: Pain in thoracic spine     Problem List Patient Active Problem List   Diagnosis Date Noted   Encounter for well woman exam with routine gynecological exam 07/28/2021   Urinary frequency 07/28/2021   Screen for STD (sexually transmitted disease) 07/28/2021   Encounter for initial  prescription of contraceptive pills 07/28/2021   Back pain without sciatica 07/11/2021   Neck pain 07/11/2021   Alcohol use, 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[facility]) 04/01/2021   Vesicles 02/03/2021   Encounter for follow-up 01/05/2021   Encounter for initial prescription of vaginal ring hormonal contraceptive 09/28/2020   Pregnancy test negative 09/28/2020   Encounter for gynecological examination with Papanicolaou smear of cervix 07/21/2020   Screening examination for STD (sexually transmitted disease) 07/21/2020   Routine general medical examination at a health care facility 07/21/2020   Folliculitis 06/23/2020   Recurrent boils 06/23/2020   Paresthesia of both legs 06/08/2020   Acute bilateral low back pain without sciatica 03/29/2020   Anxiety 02/23/2020   Need for immunization against influenza 02/23/2020   Other chest pain 02/23/2020   Alopecia 01/29/2020   Encounter for female birth control 01/29/2020   BV (bacterial vaginosis) 08/06/2019   Vaginal discharge 08/06/2019   Vaginal irritation 08/06/2019   Itching of vulva 08/06/2019   History of preterm delivery 12/14/2018   Proteinuria 12/06/2018   Marijuana use 07/17/2018   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 [place], [place], [address] Phone: [phone]   Fax:  [phone]  Name: Tara Colon MRN: [id number] Date of Birth: 28-Mar-1990

## 2021-08-17 ENCOUNTER — Ambulatory Visit: Payer: Medicaid Other

## 2021-08-17 DIAGNOSIS — M546 Pain in thoracic spine: Secondary | ICD-10-CM | POA: Diagnosis not present

## 2021-08-17 DIAGNOSIS — M542 Cervicalgia: Secondary | ICD-10-CM | POA: Diagnosis not present

## 2021-08-17 DIAGNOSIS — M5489 Other dorsalgia: Secondary | ICD-10-CM | POA: Diagnosis not present

## 2021-08-17 NOTE — Therapy (Signed)
[facility] [address] [place], [place], [address] Phone: [phone]   Fax:  [phone]  Physical Therapy Treatment  Patient Details  Name: Tara Colon MRN: [id number] Date of Birth: 1991/02/10 Referring Provider (PT): Lyla Glassing   Encounter Date: 08/17/2021   PT End of Session - 08/17/21 1304     Visit Number 5    Number of Visits 10    Date for PT Re-Evaluation 08/25/21    PT Start Time 1300    PT Stop Time 1350    PT Time Calculation (min) 50 min    Activity Tolerance Patient tolerated treatment well    Behavior During Therapy [facility] for tasks assessed/performed             Past Medical History:  Diagnosis Date   Anxiety    Bipolar affect, depressed (HCC)    BV (bacterial vaginosis)    Depression    HSV (herpes simplex virus) infection    pt reports no outbreaks   Insomnia     Past Surgical History:  Procedure Laterality Date   NO PAST SURGERIES      There were no vitals filed for this visit.   Subjective Assessment - 08/17/21 1302     Subjective Pt arrives for today's treatment session reporting 8/10 thoracic and cervical spine pain.  Pt reports that she woke up [place] Day with 10/10 pain without known cause.    Pertinent History Bi-Polar.    How long can you stand comfortably? ~5 minutes.    Diagnostic tests X-ray.    Patient Stated Goals Get out of pain.    Currently in Pain? Yes    Pain Score 8     Pain Location Back    Pain Orientation Upper;Mid    Pain Onset 1 to 4 weeks ago                               [facility] Adult PT Treatment/Exercise - 08/17/21 0001       Exercises   Exercises Shoulder;Neck;Lumbar      Neck Exercises: Machines for Strengthening   UBE (Upper Arm Bike) 90 RPM x 10 mins (5 mins forward/backward)    Nustep Lvl 4 x 20 mins      Lumbar Exercises: Stretches   Prone on Elbows Stretch 5 reps;30 seconds      Lumbar Exercises: Quadruped   [place]/Old  Horse 15 reps    Other Quadruped Lumbar Exercises UE Reach Through 10 reps each direction    Other Quadruped Lumbar Exercises Child pose 5 reps x 30 sec                          PT Long Term Goals - 08/17/21 1307       PT LONG TERM GOAL #1   Title Independent with a HEP.    Baseline No knowledge of appropriate ther ex.    Time 5    Period Weeks    Status On-going      PT LONG TERM GOAL #2   Title Sleep undisturbed 6 hours.    Baseline Patient cannot sleep comfortably due to pain. 08/17/21: only able to sleep about an hour at a time    Time 5    Period Weeks    Status On-going      PT LONG TERM GOAL #3   Title Stand 20 minutes with  pain not > 2-3/10.    Baseline Patient able to stand only about 5 minutes. 08/17/21: able to tolerate standing around 10 mins at a time    Time 5    Period Weeks    Status On-going                   Plan - 08/17/21 1305     Clinical Impression Statement Pt arrives for today's treatment session reporting 8/10 cervical and thoracic back pain.  Pt instructed in quadruped stretches to decrease pain and tone throughout thoracic and cervical spine.  Pt reporting "some" relief from stretches and prone lying.  Pt given printed HEP with stretches and instructed to perform twice a day.  Pt reported decreased pain at completion of today's treatment session, but did not give a number.  Pt making limited progress towards goals at this time.    Personal Factors and Comorbidities Other    Examination-Activity Limitations Stand;Sleep    Stability/Clinical Decision Making Stable/Uncomplicated    Rehab Potential Excellent    PT Frequency 2x / week    PT Treatment/Interventions Manual techniques;Therapeutic activities;Therapeutic exercise    PT Next Visit Plan STW/M, scapular exercises.    Consulted and Agree with Plan of Care Patient             Patient will benefit from skilled therapeutic intervention in order to improve the  following deficits and impairments:  Increased muscle spasms, Decreased activity tolerance, Pain  Visit Diagnosis: Pain in thoracic spine     Problem List Patient Active Problem List   Diagnosis Date Noted   Encounter for well woman exam with routine gynecological exam 07/28/2021   Urinary frequency 07/28/2021   Screen for STD (sexually transmitted disease) 07/28/2021   Encounter for initial prescription of contraceptive pills 07/28/2021   Back pain without sciatica 07/11/2021   Neck pain 07/11/2021   Alcohol use, 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HCC) 04/01/2021   Vesicles 02/03/2021   Encounter for follow-up 01/05/2021   Encounter for initial prescription of vaginal ring hormonal contraceptive 09/28/2020   Pregnancy test negative 09/28/2020   Encounter for gynecological examination with Papanicolaou smear of cervix 07/21/2020   Screening examination for STD (sexually transmitted disease) 07/21/2020   Routine general medical examination at a health care facility 07/21/2020   Folliculitis 06/23/2020   Recurrent boils 06/23/2020   Paresthesia of both legs 06/08/2020   Acute bilateral low back pain without sciatica 03/29/2020   Anxiety 02/23/2020   Need for immunization against influenza 02/23/2020   Other chest pain 02/23/2020   Alopecia 01/29/2020   Encounter for female birth control 01/29/2020   BV (bacterial vaginosis) 08/06/2019   Vaginal discharge 08/06/2019   Vaginal irritation 08/06/2019   Itching of vulva 08/06/2019   History of preterm delivery 12/14/2018   Proteinuria 12/06/2018   Marijuana use 07/17/2018   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

## 2021-08-24 ENCOUNTER — Ambulatory Visit: Payer: Medicaid Other | Attending: Nurse Practitioner

## 2021-08-24 DIAGNOSIS — M546 Pain in thoracic spine: Secondary | ICD-10-CM | POA: Diagnosis not present

## 2021-08-24 NOTE — Therapy (Signed)
[facility] [address] [place], [place], [address] Phone: [phone]   Fax:  [phone]  Physical Therapy Treatment  Patient Details  Name: Tara Colon MRN: [id number] Date of Birth: 01/20/1991 Referring Provider (PT): Lyla Glassing   Encounter Date: 08/24/2021   PT End of Session - 08/24/21 1303     Visit Number 6    Number of Visits 10    Date for PT Re-Evaluation 08/25/21    PT Start Time 1300    PT Stop Time 1347    PT Time Calculation (min) 47 min    Activity Tolerance Patient tolerated treatment well    Behavior During Therapy [facility] for tasks assessed/performed             Past Medical History:  Diagnosis Date   Anxiety    Bipolar affect, depressed (HCC)    BV (bacterial vaginosis)    Depression    HSV (herpes simplex virus) infection    pt reports no outbreaks   Insomnia     Past Surgical History:  Procedure Laterality Date   NO PAST SURGERIES      There were no vitals filed for this visit.   Subjective Assessment - 08/24/21 1301     Subjective Pt arrives for today's treatment session reporting 7/10 thoracic and cervical spine pain.  Pt reports that she has been performing stretches at home, but continues to wake up with pain.    Pertinent History Bi-Polar.    How long can you stand comfortably? ~5 minutes.    Diagnostic tests X-ray.    Patient Stated Goals Get out of pain.    Currently in Pain? Yes    Pain Score 7     Pain Location Back    Pain Orientation Upper;Mid    Pain Onset 1 to 4 weeks ago                               [facility] Adult PT Treatment/Exercise - 08/24/21 0001       Neck Exercises: Machines for Strengthening   UBE (Upper Arm Bike) 90 RPM x 10 mins    Nustep Lvl 4 x 20 mins      Lumbar Exercises: Stretches   Prone on Elbows Stretch Other (comment)   4 mins     Lumbar Exercises: Sidelying   Other Sidelying Lumbar Exercises Open books 3 reps x 30 sec B                           PT Long Term Goals - 08/17/21 1307       PT LONG TERM GOAL #1   Title Independent with a HEP.    Baseline No knowledge of appropriate ther ex.    Time 5    Period Weeks    Status On-going      PT LONG TERM GOAL #2   Title Sleep undisturbed 6 hours.    Baseline Patient cannot sleep comfortably due to pain. 08/17/21: only able to sleep about an hour at a time    Time 5    Period Weeks    Status On-going      PT LONG TERM GOAL #3   Title Stand 20 minutes with pain not > 2-3/10.    Baseline Patient able to stand only about 5 minutes. 08/17/21: able to tolerate standing around 10 mins at a time  Time 5    Period Weeks    Status On-going                   Plan - 08/24/21 1303     Clinical Impression Statement Pt arrives for today's treatment session reporting 7/10 cervical and thoracic back pain.  Pt states that she is performing her exercises/strethces at home, but is frustrated that she continues to wake up with back pain.  Pt instructed in side-lying open book stretch with min cues for proper hold and follow hand with nose.  Stretches and prone lying assist with decrease thoracic pain and tightness, but relief is short lived.  Pt reported 5/10 cervical and thoracic spine pain at completion of today's treatment session.    Personal Factors and Comorbidities Other    Examination-Activity Limitations Stand;Sleep    Stability/Clinical Decision Making Stable/Uncomplicated    Rehab Potential Excellent    PT Frequency 2x / week    PT Treatment/Interventions Manual techniques;Therapeutic activities;Therapeutic exercise    PT Next Visit Plan STW/M, scapular exercises.    Consulted and Agree with Plan of Care Patient             Patient will benefit from skilled therapeutic intervention in order to improve the following deficits and impairments:  Increased muscle spasms, Decreased activity tolerance, Pain  Visit Diagnosis: Pain in  thoracic spine     Problem List Patient Active Problem List   Diagnosis Date Noted   Encounter for well woman exam with routine gynecological exam 07/28/2021   Urinary frequency 07/28/2021   Screen for STD (sexually transmitted disease) 07/28/2021   Encounter for initial prescription of contraceptive pills 07/28/2021   Back pain without sciatica 07/11/2021   Neck pain 07/11/2021   Alcohol use, 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HCC) 04/01/2021   Vesicles 02/03/2021   Encounter for follow-up 01/05/2021   Encounter for initial prescription of vaginal ring hormonal contraceptive 09/28/2020   Pregnancy test negative 09/28/2020   Encounter for gynecological examination with Papanicolaou smear of cervix 07/21/2020   Screening examination for STD (sexually transmitted disease) 07/21/2020   Routine general medical examination at a health care facility 07/21/2020   Folliculitis 06/23/2020   Recurrent boils 06/23/2020   Paresthesia of both legs 06/08/2020   Acute bilateral low back pain without sciatica 03/29/2020   Anxiety 02/23/2020   Need for immunization against influenza 02/23/2020   Other chest pain 02/23/2020   Alopecia 01/29/2020   Encounter for female birth control 01/29/2020   BV (bacterial vaginosis) 08/06/2019   Vaginal discharge 08/06/2019   Vaginal irritation 08/06/2019   Itching of vulva 08/06/2019   History of preterm delivery 12/14/2018   Proteinuria 12/06/2018   Marijuana use 07/17/2018   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

## 2021-08-29 ENCOUNTER — Ambulatory Visit: Payer: Medicaid Other

## 2021-08-29 DIAGNOSIS — M546 Pain in thoracic spine: Secondary | ICD-10-CM | POA: Diagnosis not present

## 2021-08-29 NOTE — Therapy (Signed)
[facility] [address] [place], [place], [address] Phone: [phone]   Fax:  [phone]  Physical Therapy Treatment  Patient Details  Name: Tara Colon MRN: [id number] Date of Birth: February 20, 1991 Referring Provider (PT): Lyla Glassing   Encounter Date: 08/29/2021   PT End of Session - 08/29/21 1308     Visit Number 7    Number of Visits 10    Date for PT Re-Evaluation 08/25/21    PT Start Time 1301    PT Stop Time 1350    PT Time Calculation (min) 49 min    Activity Tolerance Patient tolerated treatment well    Behavior During Therapy [facility] for tasks assessed/performed             Past Medical History:  Diagnosis Date   Anxiety    Bipolar affect, depressed (HCC)    BV (bacterial vaginosis)    Depression    HSV (herpes simplex virus) infection    pt reports no outbreaks   Insomnia     Past Surgical History:  Procedure Laterality Date   NO PAST SURGERIES      There were no vitals filed for this visit.   Subjective Assessment - 08/29/21 1305     Subjective Pt arrives for today's treatment session reporting 8/10 low back pain.  Pt states that she has an appointment with her MD tomorrow.    Pertinent History Bi-Polar.    How long can you stand comfortably? ~5 minutes.    Diagnostic tests X-ray.    Patient Stated Goals Get out of pain.    Currently in Pain? Yes    Pain Score 8     Pain Location Back    Pain Orientation Mid;Lower    Pain Onset 1 to 4 weeks ago                               [facility] Adult PT Treatment/Exercise - 08/29/21 0001       Neck Exercises: Machines for Strengthening   UBE (Upper Arm Bike) 90 RPM x 10 mins    Nustep Lvl 4 x 20 mins      Lumbar Exercises: Stretches   Single Knee to Chest Stretch 5 reps;10 seconds    Lower Trunk Rotation 5 reps;10 seconds    Prone on Elbows Stretch Other (comment)   5 mins     Lumbar Exercises: Supine   Bridge 20 reps;3 seconds                           PT Long Term Goals - 08/29/21 1311       PT LONG TERM GOAL #1   Title Independent with a HEP.    Baseline No knowledge of appropriate ther ex.    Time 5    Period Weeks    Status On-going      PT LONG TERM GOAL #2   Title Sleep undisturbed 6 hours.    Baseline Patient cannot sleep comfortably due to pain. 08/17/21: only able to sleep about an hour at a time; 08/29/21: only able to sleep an hour at a time    Time 5    Period Weeks    Status On-going      PT LONG TERM GOAL #3   Title Stand 20 minutes with pain not > 2-3/10.    Baseline Patient able to stand only about  5 minutes. 08/17/21: able to tolerate standing around 10 mins at a time; 08/29/21: 10 mins at a time    Time 5    Period Weeks    Status On-going                   Plan - 08/29/21 1309     Clinical Impression Statement Pt arrives for today's treatmetn session reporting 8/10 low back pain.  Pt states she does not feel like therapy is helping at this time and has an MD appointment tomorrow.  Pt instructed in low back stretches and exercises to increase ROM and function.  Pt reported 8/10 low back pain at completion of today's treatment session.  Pt encouraged to continue performing stretches at home as previously instructed.    Personal Factors and Comorbidities Other    Examination-Activity Limitations Stand;Sleep    Stability/Clinical Decision Making Stable/Uncomplicated    Rehab Potential Excellent    PT Frequency 2x / week    PT Treatment/Interventions Manual techniques;Therapeutic activities;Therapeutic exercise    PT Next Visit Plan STW/M, scapular exercises.    Consulted and Agree with Plan of Care Patient             Patient will benefit from skilled therapeutic intervention in order to improve the following deficits and impairments:  Increased muscle spasms, Decreased activity tolerance, Pain  Visit Diagnosis: Pain in thoracic spine     Problem  List Patient Active Problem List   Diagnosis Date Noted   Encounter for well woman exam with routine gynecological exam 07/28/2021   Urinary frequency 07/28/2021   Screen for STD (sexually transmitted disease) 07/28/2021   Encounter for initial prescription of contraceptive pills 07/28/2021   Back pain without sciatica 07/11/2021   Neck pain 07/11/2021   Alcohol use, 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HCC) 04/01/2021   Vesicles 02/03/2021   Encounter for follow-up 01/05/2021   Encounter for initial prescription of vaginal ring hormonal contraceptive 09/28/2020   Pregnancy test negative 09/28/2020   Encounter for gynecological examination with Papanicolaou smear of cervix 07/21/2020   Screening examination for STD (sexually transmitted disease) 07/21/2020   Routine general medical examination at a health care facility 07/21/2020   Folliculitis 06/23/2020   Recurrent boils 06/23/2020   Paresthesia of both legs 06/08/2020   Acute bilateral low back pain without sciatica 03/29/2020   Anxiety 02/23/2020   Need for immunization against influenza 02/23/2020   Other chest pain 02/23/2020   Alopecia 01/29/2020   Encounter for female birth control 01/29/2020   BV (bacterial vaginosis) 08/06/2019   Vaginal discharge 08/06/2019   Vaginal irritation 08/06/2019   Itching of vulva 08/06/2019   History of preterm delivery 12/14/2018   Proteinuria 12/06/2018   Marijuana use 07/17/2018   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

## 2021-08-30 ENCOUNTER — Ambulatory Visit: Payer: Medicaid Other | Admitting: Nurse Practitioner

## 2021-08-30 ENCOUNTER — Encounter: Payer: Self-pay | Admitting: Nurse Practitioner

## 2021-08-30 VITALS — BP 109/69 | HR 80 | Temp 98.8°F | Ht 68.0 in | Wt 227.0 lb

## 2021-08-30 DIAGNOSIS — M5489 Other dorsalgia: Secondary | ICD-10-CM | POA: Diagnosis not present

## 2021-08-30 MED ORDER — METHYLPREDNISOLONE ACETATE 80 MG/ML IJ SUSP
80.0000 mg | Freq: Once | INTRAMUSCULAR | Status: AC
  Administered 2021-08-30: 80 mg via INTRAMUSCULAR

## 2021-08-30 MED ORDER — METHOCARBAMOL 750 MG PO TABS: 750.0000 mg | ORAL_TABLET | Freq: Four times a day (QID) | ORAL | 1 refills | Status: DC

## 2021-08-30 MED ORDER — PREDNISONE 20 MG PO TABS: 20.0000 mg | ORAL_TABLET | Freq: Every day | ORAL | 0 refills | Status: DC

## 2021-08-30 NOTE — Patient Instructions (Signed)
Acute Back Pain, Adult Acute back pain is sudden and usually short-lived. It is often caused by an injury to the muscles and tissues in the back. The injury may result from: A muscle, tendon, or ligament getting overstretched or torn. Ligaments are tissues that connect bones to each other. Lifting something improperly can cause a back strain. Wear and tear (degeneration) of the spinal disks. Spinal disks are circular tissue that provide cushioning between the bones of the spine (vertebrae). Twisting motions, such as while playing sports or doing yard work. A hit to the back. Arthritis. You may have a physical exam, lab tests, and imaging tests to find the cause of your pain. Acute back pain usually goes away with rest and home care. Follow these instructions at home: Managing pain, stiffness, and swelling Take over-the-counter and prescription medicines only as told by your health care provider. Treatment may include medicines for pain and inflammation that are taken by mouth or applied to the skin, or muscle relaxants. Your health care provider may recommend applying ice during the first 24-48 hours after your pain starts. To do this: Put ice in a plastic bag. Place a towel between your skin and the bag. Leave the ice on for 20 minutes, 2-3 times a day. Remove the ice if your skin turns bright red. This is very important. If you cannot feel pain, heat, or cold, you have a greater risk of damage to the area. If directed, apply heat to the affected area as often as told by your health care provider. Use the heat source that your health care provider recommends, such as a moist heat pack or a heating pad. Place a towel between your skin and the heat source. Leave the heat on for 20-30 minutes. Remove the heat if your skin turns bright red. This is especially important if you are unable to feel pain, heat, or cold. You have a greater risk of getting burned. Activity  Do not stay in bed. Staying in  bed for more than 1-2 days can delay your recovery. Sit up and stand up straight. Avoid leaning forward when you sit or hunching over when you stand. If you work at a desk, sit close to it so you do not need to lean over. Keep your chin tucked in. Keep your neck drawn back, and keep your elbows bent at a 90-degree angle (right angle). Sit high and close to the steering wheel when you drive. Add lower back (lumbar) support to your car seat, if needed. Take short walks on even surfaces as soon as you are able. Try to increase the length of time you walk each day. Do not sit, drive, or stand in one place for more than 30 minutes at a time. Sitting or standing for long periods of time can put stress on your back. Do not drive or use heavy machinery while taking prescription pain medicine. Use proper lifting techniques. When you bend and lift, use positions that put less stress on your back: Bend your knees. Keep the load close to your body. Avoid twisting. Exercise regularly as told by your health care provider. Exercising helps your back heal faster and helps prevent back injuries by keeping muscles strong and flexible. Work with a physical therapist to make a safe exercise program, as recommended by your health care provider. Do any exercises as told by your physical therapist. Lifestyle Maintain a healthy weight. Extra weight puts stress on your back and makes it difficult to have good   posture. Avoid activities or situations that make you feel anxious or stressed. Stress and anxiety increase muscle tension and can make back pain worse. Learn ways to manage anxiety and stress, such as through exercise. General instructions Sleep on a firm mattress in a comfortable position. Try lying on your side with your knees slightly bent. If you lie on your back, put a pillow under your knees. Keep your head and neck in a straight line with your spine (neutral position) when using electronic equipment like  smartphones or pads. To do this: Raise your smartphone or pad to look at it instead of bending your head or neck to look down. Put the smartphone or pad at the level of your face while looking at the screen. Follow your treatment plan as told by your health care provider. This may include: Cognitive or behavioral therapy. Acupuncture or massage therapy. Meditation or yoga. Contact a health care provider if: You have pain that is not relieved with rest or medicine. You have increasing pain going down into your legs or buttocks. Your pain does not improve after 2 weeks. You have pain at night. You lose weight without trying. You have a fever or chills. You develop nausea or vomiting. You develop abdominal pain. Get help right away if: You develop new bowel or bladder control problems. You have unusual weakness or numbness in your arms or legs. You feel faint. These symptoms may represent a serious problem that is an emergency. Do not wait to see if the symptoms will go away. Get medical help right away. Call your local emergency services (911 in the [place] Do not drive yourself to the hospital. Summary Acute back pain is sudden and usually short-lived. Use proper lifting techniques. When you bend and lift, use positions that put less stress on your back. Take over-the-counter and prescription medicines only as told by your health care provider, and apply heat or ice as told. This information is not intended to replace advice given to you by your health care provider. Make sure you discuss any questions you have with your health care provider. Document Revised: 05/28/2020 Document Reviewed: 05/28/2020 Elsevier Patient Education  2023 [employer]  
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
## 2021-08-31 ENCOUNTER — Ambulatory Visit: Payer: Medicaid Other

## 2021-08-31 DIAGNOSIS — M546 Pain in thoracic spine: Secondary | ICD-10-CM

## 2021-08-31 NOTE — Therapy (Addendum)
[facility] [place], [place], [address] Phone: [phone]   Fax:  [phone]  Physical Therapy Treatment  Patient Details  Name: Tara Colon MRN: [id number] Date of Birth: 1990-03-25 Referring Provider (PT): Lynder Parents   Encounter Date: 08/31/2021   PT End of Session - 08/31/21 1305     Visit Number 8    Number of Visits 10    Date for PT Re-Evaluation 08/25/21    PT Start Time 1300    Activity Tolerance Patient tolerated treatment well    Behavior During Therapy [facility] for tasks assessed/performed             Past Medical History:  Diagnosis Date   Anxiety    Bipolar affect, depressed ([place])    BV (bacterial vaginosis)    Depression    HSV (herpes simplex virus) infection    pt reports no outbreaks   Insomnia     Past Surgical History:  Procedure Laterality Date   NO PAST SURGERIES      There were no vitals filed for this visit.   Subjective Assessment - 08/31/21 1303     Subjective Pt arrives for today's treatment session reporting 8/10 low back pain.  Pt states that she saw her MD yesterday and that she has a referral to ortho with appointment in June.    Pertinent History Bi-Polar.    How long can you stand comfortably? ~5 minutes.    Diagnostic tests X-ray.    Patient Stated Goals Get out of pain.    Currently in Pain? Yes    Pain Score 8     Pain Location Back    Pain Orientation Mid;Lower    Pain Onset 1 to 4 weeks ago                               [facility] Adult PT Treatment/Exercise - 08/31/21 0001       Neck Exercises: Machines for Strengthening   UBE (Upper Arm Bike) 90 RPM x 10 mins    Nustep Lvl 4 x 20 mins      Lumbar Exercises: Stretches   Prone on Elbows Stretch Other (comment)   5 mins     Lumbar Exercises: Sidelying   Other Sidelying Lumbar Exercises Open books 10 reps x 10 sec hold; B                          PT Long Term Goals -  08/31/21 1339       PT LONG TERM GOAL #1   Title Independent with a HEP.    Baseline No knowledge of appropriate ther ex.  08/31/21: performing all stretches at home    Time 5    Period Weeks    Status Achieved      PT LONG TERM GOAL #2   Title Sleep undisturbed 6 hours.    Baseline Patient cannot sleep comfortably due to pain. 08/17/21: only able to sleep about an hour at a time; 08/29/21: only able to sleep an hour at a time    Time 5    Period Weeks    Status On-going      PT LONG TERM GOAL #3   Title Stand 20 minutes with pain not > 2-3/10.    Baseline Patient able to stand only about 5 minutes. 08/17/21: able to tolerate standing around 10 mins at  a time; 08/29/21: 10 mins at a time    Time 5    Period Weeks    Status On-going                   Plan - 08/31/21 1305     Clinical Impression Statement Pt arrives for today's treatment session reporting 8/10 low back pain.  Pt reports that she had an appointment with her PCP yesterday and has gotten a referrel to ortho and has an appointment next Thursday.  Pt is performing all stretches at home and reports minimal, short lived relief.  Pt is making minimal progress towards her goals with continued high level of pain.  Pt states that she wishes to discharge and continue her HEP at home.  Pt reported 7/10 low back pain at completion of today's treatment session. Pt encouraged to call the facility with any questions or concerns.    Personal Factors and Comorbidities Other    Examination-Activity Limitations Stand;Sleep    Stability/Clinical Decision Making Stable/Uncomplicated    Rehab Potential Excellent    PT Frequency 2x / week    PT Treatment/Interventions Manual techniques;Therapeutic activities;Therapeutic exercise    PT Next Visit Plan STW/M, scapular exercises.    Consulted and Agree with Plan of Care Patient             Patient will benefit from skilled therapeutic intervention in order to improve the following  deficits and impairments:  Increased muscle spasms, Decreased activity tolerance, Pain  Visit Diagnosis: Pain in thoracic spine     Problem List Patient Active Problem List   Diagnosis Date Noted   Encounter for well woman exam with routine gynecological exam 07/28/2021   Urinary frequency 07/28/2021   Screen for STD (sexually transmitted disease) 07/28/2021   Encounter for initial prescription of contraceptive pills 07/28/2021   Back pain without sciatica 07/11/2021   Neck pain 07/11/2021   Alcohol use, 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[place]) 04/01/2021   Vesicles 02/03/2021   Encounter for follow-up 01/05/2021   Encounter for initial prescription of vaginal ring hormonal contraceptive 09/28/2020   Pregnancy test negative 09/28/2020   Encounter for gynecological examination with Papanicolaou smear of cervix 07/21/2020   Screening examination for STD (sexually transmitted disease) 07/21/2020   Routine general medical examination at a health care facility 16/12/9602   Folliculitis 54/11/8117   Recurrent boils 06/23/2020   Paresthesia of both legs 06/08/2020   Acute bilateral low back pain without sciatica 03/29/2020   Anxiety 02/23/2020   Need for immunization against influenza 02/23/2020   Other chest pain 02/23/2020   Alopecia 01/29/2020   Encounter for female birth control 01/29/2020   BV (bacterial vaginosis) 08/06/2019   Vaginal discharge 08/06/2019   Vaginal irritation 08/06/2019   Itching of vulva 08/06/2019   History of preterm delivery 12/14/2018   Proteinuria 12/06/2018   Marijuana use 07/17/2018   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   Smoker 07/16/2018   Depression with anxiety 07/16/2018   Encounter for smoking cessation counseling 05/22/2018   PTSD (post-traumatic stress disorder) 05/03/2017   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  Current functional level related to goals /  functional outcomes: See above.   Remaining deficits: Continued c/o pain.     Education / Equipment: HEP.   Patient agrees to discharge. Patient goals were partially met. Patient is being discharged due to lack of progress.    Mali Applegate MPT

## 2021-09-08 ENCOUNTER — Ambulatory Visit (INDEPENDENT_AMBULATORY_CARE_PROVIDER_SITE_OTHER): Payer: Medicaid Other | Admitting: Orthopaedic Surgery

## 2021-09-08 DIAGNOSIS — M545 Low back pain, unspecified: Secondary | ICD-10-CM

## 2021-09-15 ENCOUNTER — Encounter: Payer: Self-pay | Admitting: Family Medicine

## 2021-09-15 ENCOUNTER — Ambulatory Visit: Payer: Medicaid Other | Admitting: Family Medicine

## 2021-09-15 VITALS — BP 106/60 | HR 87 | Temp 97.5°F | Ht 68.0 in | Wt 226.1 lb

## 2021-09-15 DIAGNOSIS — L03115 Cellulitis of right lower limb: Secondary | ICD-10-CM | POA: Diagnosis not present

## 2021-09-15 MED ORDER — CEPHALEXIN 500 MG PO CAPS: 500.0000 mg | ORAL_CAPSULE | Freq: Four times a day (QID) | ORAL | 0 refills | Status: DC

## 2021-09-15 MED ORDER — CEPHALEXIN 500 MG PO CAPS: 500.0000 mg | ORAL_CAPSULE | Freq: Four times a day (QID) | ORAL | 0 refills | Status: AC

## 2021-09-15 NOTE — Progress Notes (Signed)
   Acute Office Visit  Subjective:     Patient ID: Tara Colon, female    DOB: 1991/02/14, 31 y.o.   MRN: [id number]  Chief Complaint  Patient presents with   Insect Bite    HPI Patient is in today for a bug bite. She noticed this yesterday on her right upper thigh. She did not see an insect that caused the bite. She just noticed a small bump on her thigh with tenderness, itching, and erythema. She denies fever or drainage. Denies numbness or tingling.   ROS As per HPI.      Objective:    BP 106/60   Pulse 87   Temp (!) 97.5 F (36.4 C) (Temporal)   Ht 5\' 8"  (1.727 m)   Wt 226 lb 2 oz (102.6 kg)   SpO2 97%   BMI 34.38 kg/m    Physical Exam Vitals and nursing note reviewed.  Constitutional:      General: She is not in acute distress.    Appearance: She is not ill-appearing, toxic-appearing or diaphoretic.  Pulmonary:     Effort: Pulmonary effort is normal. No respiratory distress.  Skin:    Comments: Small pustular lesion to anterior right thighwith surround erythema and tenderness, area of induration palpated under the skin.  Neurological:     General: No focal deficit present.     Mental Status: She is alert and oriented to person, place, and time.     Motor: No weakness.     Gait: Gait normal.  Psychiatric:        Mood and Affect: Mood normal.        Behavior: Behavior normal.     No results found for any visits on 09/15/21.      Assessment & Plan:   Tara Colon was seen today for insect bite.  Diagnoses and all orders for this visit:  Cellulitis of right lower extremity Keflex as below. Warm compresses TID. Return to office for new or worsening symptoms, or if symptoms persist.  -     cephALEXin (KEFLEX) 500 MG capsule; Take 1 capsule (500 mg total) by mouth 4 (four) times daily for 7 days.  The patient indicates understanding of these issues and agrees with the plan.  Merry Proud, FNP

## 2021-09-15 NOTE — Addendum Note (Signed)
Addended by: Gabriel Earing on: 09/15/2021 02:55 PM   Modules accepted: Orders

## 2021-09-22 ENCOUNTER — Ambulatory Visit: Payer: Medicaid Other | Admitting: Orthopaedic Surgery

## 2021-09-23 ENCOUNTER — Ambulatory Visit (HOSPITAL_COMMUNITY)
Admission: RE | Admit: 2021-09-23 | Discharge: 2021-09-23 | Disposition: A | Discharge status: Home / Self Care | Payer: Medicaid Other | Source: Ambulatory Visit | Attending: Orthopaedic Surgery | Admitting: Orthopaedic Surgery

## 2021-09-23 ENCOUNTER — Telehealth: Payer: Self-pay

## 2021-09-23 DIAGNOSIS — M545 Low back pain, unspecified: Secondary | ICD-10-CM | POA: Diagnosis not present

## 2021-09-23 DIAGNOSIS — M549 Dorsalgia, unspecified: Secondary | ICD-10-CM | POA: Diagnosis not present

## 2021-09-23 NOTE — Telephone Encounter (Signed)
Pt has messed up taking her birth control. Pt didn't take pills at the end of the pack. Started a new pack. Pt has 5 pills left. Can get new pack on 7/20. I advised pt to start the new pack with the first pill and use backup the remainder of this pack and through the second pack. Pt voiced understanding. JSY

## 2021-09-23 NOTE — Telephone Encounter (Signed)
Having issues with her pills she has a few questions

## 2021-09-27 ENCOUNTER — Encounter: Payer: Self-pay | Admitting: Nurse Practitioner

## 2021-09-27 ENCOUNTER — Ambulatory Visit: Payer: Medicaid Other | Admitting: Nurse Practitioner

## 2021-09-27 VITALS — BP 108/69 | HR 76 | Temp 98.6°F | Ht 68.0 in | Wt 225.0 lb

## 2021-09-27 DIAGNOSIS — G44209 Tension-type headache, unspecified, not intractable: Secondary | ICD-10-CM | POA: Diagnosis not present

## 2021-09-27 MED ORDER — NAPROXEN 500 MG PO TABS: 500.0000 mg | ORAL_TABLET | Freq: Two times a day (BID) | ORAL | 1 refills | Status: DC

## 2021-09-27 NOTE — Patient Instructions (Signed)
Tension Headache, Adult A tension headache is a feeling of pain, pressure, or aching in the head. It is often felt over the front and sides of the head. Tension headaches can last from 30 minutes to several days. What are the causes? The cause of this condition is not known. Sometimes, tension headaches are brought on by stress, worry (anxiety), or depression. Other things that may set them off include: Alcohol. Too much caffeine or caffeine withdrawal. Colds, flu, or sinus infections. Dental problems. This can include clenching your teeth. Being tired. Holding your head and neck in the same position for a long time, such as while using a computer. Smoking. Arthritis in the neck. What are the signs or symptoms? Feeling pressure around the head. A dull ache in the head. Pain over the front and sides of the head. Feeling sore or tender in the muscles of the head, neck, and shoulders. How is this treated? This condition may be treated with lifestyle changes and with medicines that help relieve symptoms. Follow these instructions at home: Managing pain Take over-the-counter and prescription medicines only as told by your doctor. When you have a headache, lie down in a dark, quiet room. If told, put ice on your head and neck. To do this: Put ice in a plastic bag. Place a towel between your skin and the bag. Leave the ice on for 20 minutes, 2-3 times a day. Take off the ice if your skin turns bright red. This is very important. If you cannot feel pain, heat, or cold, you have a greater risk of damage to the area. If told, put heat on the back of your neck. Do this as often as told by your doctor. Use the heat source that your doctor recommends, such as a moist heat pack or a heating pad. Place a towel between your skin and the heat source. Leave the heat on for 20-30 minutes. Take off the heat if your skin turns bright red. This is very important. If you cannot feel pain, heat, or cold, you  have a greater risk of getting burned. Eating and drinking Eat meals on a regular schedule. If you drink alcohol: Limit how much you have to: 0-1 drink a day for women who are not pregnant. 0-2 drinks a day for men. Know how much alcohol is in your drink. In the [place] one drink equals one 12 oz bottle of beer (355 mL), one 5 oz glass of wine (148 mL), or one 1 oz glass of hard liquor (44 mL). Drink enough fluid to keep your pee (urine) pale yellow. Do not use a lot of caffeine, or stop using caffeine. Lifestyle Get 7-9 hours of sleep each night. Or get the amount of sleep that your doctor tells you to. At bedtime, keep computers, phones, and tablets out of your room. Find ways to lessen your stress. This may include: Exercise. Deep breathing. Yoga. Listening to music. Thinking positive thoughts. Sit up straight. Try to relax your muscles. Do not smoke or use any products that contain nicotine or tobacco. If you need help quitting, ask your doctor. General instructions  Avoid things that can bring on headaches. Keep a headache journal to see what may bring on headaches. For example, write down: What you eat and drink. How much sleep you get. Any change to your diet or medicines. Keep all follow-up visits. Contact a doctor if: Your headache does not get better. Your headache comes back. You have a headache, and sounds,   light, or smells bother you. You feel like you may vomit, or you vomit. Your stomach hurts. Get help right away if: You all of a sudden get a very bad headache with any of these things: A stiff neck. Feeling like you may vomit. Vomiting. Feeling mixed up (confused). Feeling weak in one part or one side of your body. Having trouble seeing or speaking, or both. Feeling short of breath. A rash. Feeling very sleepy. Pain in your eye or ear. Trouble walking or balancing. Feeling like you will faint, or you faint. Summary A tension headache is pain, pressure,  or aching in your head. Tension headaches can last from 30 minutes to several days. Lifestyle changes and medicines may help relieve pain. This information is not intended to replace advice given to you by your health care provider. Make sure you discuss any questions you have with your health care provider. Document Revised: 12/04/2019 Document Reviewed: 12/04/2019 Elsevier Patient Education  2023 Elsevier Inc.  

## 2021-09-27 NOTE — Progress Notes (Signed)
Acute Office Visit  Subjective:     Patient ID: Tara Colon, female    DOB: 1990/07/31, 31 y.o.   MRN: [id number]  Chief Complaint  Patient presents with   Headache   Pressure Behind the Eyes    Headache  This is a new problem. The current episode started in the past 7 days. The problem has been gradually improving. The pain is located in the Frontal and retro-orbital region. The pain does not radiate. The pain quality is not similar to prior headaches. The quality of the pain is described as aching and pulsating. The pain is moderate. Pertinent negatives include no abdominal pain, abnormal behavior, back pain, coughing, dizziness, drainage, ear pain, facial sweating, fever, hearing loss, nausea, neck pain or numbness. Nothing aggravates the symptoms. She has tried nothing for the symptoms. Her past medical history is significant for obesity.   Patient is in today for   Review of Systems  Constitutional: Negative.  Negative for fever.  HENT: Negative.  Negative for ear pain and hearing loss.   Eyes: Negative.   Respiratory: Negative.  Negative for cough.   Cardiovascular: Negative.   Gastrointestinal:  Negative for abdominal pain and nausea.  Genitourinary: Negative.   Musculoskeletal:  Negative for back pain and neck pain.  Skin: Negative.   Neurological:  Positive for headaches. Negative for dizziness and numbness.  Psychiatric/Behavioral: Negative.    All other systems reviewed and are negative.       Objective:    BP 108/69   Pulse 76   Temp 98.6 F (37 C)   Ht 5\' 8"  (1.727 m)   Wt 225 lb (102.1 kg)   LMP 09/24/2021 (Approximate)   SpO2 98%   BMI 34.21 kg/m  BP Readings from Last 3 Encounters:  09/27/21 108/69  09/15/21 106/60  08/30/21 109/69   Wt Readings from Last 3 Encounters:  09/27/21 225 lb (102.1 kg)  09/15/21 226 lb 2 oz (102.6 kg)  08/30/21 227 lb (103 kg)      Physical Exam Vitals and nursing note reviewed.  Constitutional:       Appearance: She is well-developed.  HENT:     Head: Normocephalic.  Cardiovascular:     Rate and Rhythm: Normal rate and regular rhythm.     Pulses: Normal pulses.     Heart sounds: Normal heart sounds.  Pulmonary:     Effort: Pulmonary effort is normal.     Breath sounds: Normal breath sounds.  Abdominal:     General: Bowel sounds are normal.     Palpations: Abdomen is soft.  Musculoskeletal:        General: Normal range of motion.  Skin:    General: Skin is warm.     Findings: No erythema or rash.  Neurological:     General: No focal deficit present.     Mental Status: She is alert and oriented to person, place, and time.  Psychiatric:        Behavior: Behavior normal.     No results found for any visits on 09/27/21.      Assessment & Plan:  Patient presents with tension headache symptoms present in the past 1 week.  With pressure behind eyes.  In clinic today patient states symptoms are getting better.  Advised patient to take medication as prescribed.  Started patient on naproxen 500 mg tablet by mouth twice daily as needed.  Increase hydration and increase rest with at least 8 hours of sleep.  Patient  knows to follow-up with worsening unresolved symptoms. Problem List Items Addressed This Visit   None Visit Diagnoses     Tension headache    -  Primary   Relevant Medications   naproxen (NAPROSYN) 500 MG tablet       Meds ordered this encounter  Medications   naproxen (NAPROSYN) 500 MG tablet    Sig: Take 1 tablet (500 mg total) by mouth 2 (two) times daily with a meal.    Dispense:  30 tablet    Refill:  1    Order Specific Question:   Supervising Provider    Answer:   Mechele Claude [phone]    Return if symptoms worsen or fail to improve.  Daryll Drown, NP

## 2021-09-29 ENCOUNTER — Ambulatory Visit (INDEPENDENT_AMBULATORY_CARE_PROVIDER_SITE_OTHER): Payer: Medicaid Other | Admitting: Orthopaedic Surgery

## 2021-09-29 DIAGNOSIS — M545 Low back pain, unspecified: Secondary | ICD-10-CM
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
## 2021-10-18 ENCOUNTER — Ambulatory Visit: Payer: Medicaid Other | Admitting: Nurse Practitioner

## 2021-10-18 ENCOUNTER — Encounter: Payer: Self-pay | Admitting: Nurse Practitioner

## 2021-10-18 VITALS — BP 134/67 | HR 66 | Temp 97.8°F | Ht 68.0 in | Wt 228.0 lb

## 2021-10-18 DIAGNOSIS — R300 Dysuria: Secondary | ICD-10-CM

## 2021-10-18 DIAGNOSIS — N898 Other specified noninflammatory disorders of vagina: Secondary | ICD-10-CM | POA: Diagnosis not present

## 2021-10-18 DIAGNOSIS — K047 Periapical abscess without sinus: Secondary | ICD-10-CM | POA: Diagnosis not present

## 2021-10-18 LAB — URINALYSIS, COMPLETE
Bilirubin, UA: NEGATIVE
Glucose, UA: NEGATIVE
Ketones, UA: NEGATIVE
Leukocytes,UA: NEGATIVE
Nitrite, UA: NEGATIVE
Specific Gravity, UA: >1.03 — ABNORMAL HIGH (ref 1.005–1.030)
Urobilinogen, Ur: 0.2 mg/dL (ref 0.2–1.0)
pH, UA: 5.5 (ref 5.0–7.5)

## 2021-10-18 LAB — MICROSCOPIC EXAMINATION
Bacteria, UA: NONE SEEN
Renal Epithel, UA: NONE SEEN /hpf
WBC, UA: NONE SEEN /hpf (ref 0–5)

## 2021-10-18 LAB — WET PREP FOR TRICH, YEAST, CLUE
Clue Cell Exam: NEGATIVE
Trichomonas Exam: NEGATIVE
Yeast Exam: NEGATIVE

## 2021-10-18 MED ORDER — DOXYCYCLINE HYCLATE 100 MG PO TABS: 100.0000 mg | ORAL_TABLET | Freq: Two times a day (BID) | ORAL | 0 refills | Status: DC

## 2021-10-18 NOTE — Patient Instructions (Signed)
Dental Abscess  A dental abscess is an area of pus in or around a tooth. It comes from an infection. It can cause pain and other symptoms. Treatment will help with symptoms and prevent the infection from spreading. What are the causes? This condition is caused by an infection in or around the tooth. This can be from: Very bad tooth decay (cavities). A bad injury to the tooth, such as a broken or chipped tooth. What increases the risk? The risk to get an abscess is higher in males. It is also more likely in people who: Have dental decay. Have very bad gum disease. Eat sugary snacks between meals. Use tobacco. Have diabetes. Have a weak disease-fighting system (immune system). Do not brush their teeth regularly. What are the signs or symptoms? Some mild symptoms are: Tenderness. Bad breath. Fever. A sharp, sour taste in the mouth. Pain in and around the infected tooth. Worse symptoms of this condition include: Swollen neck glands. Chills. Pus draining around the tooth. Swelling and redness around the tooth, the mouth, or the face. Very bad pain in and around the tooth. The worst symptoms can include: Difficulty swallowing. Difficulty opening your mouth. Feeling like you may vomit or vomiting. How is this treated? This is treated by getting rid of the infection. Your dentist will discuss ways to do this, including: Antibiotic medicines. Antibacterial mouth rinse. An incision in the abscess to drain out the pus. A root canal. Removing the tooth. Follow these instructions at home: Medicines Take over-the-counter and prescription medicines only as told by your dentist. If you were prescribed an antibiotic medicine, take it as told by your dentist. Do not stop taking it even if you start to feel better. If you were prescribed a gel that has numbing medicine in it, use it exactly as told. Ask your dentist if you should avoid driving or using machines while you are taking your  medicine. General instructions Rinse your mouth often with salt water. To make salt water, dissolve -1 tsp (3-6 g) of salt in 1 cup (237 mL) of warm water. Eat a soft diet while your mouth is healing. Drink enough fluid to keep your pee (urine) pale yellow. Do not apply heat to the outside of your mouth. Do not smoke or use any products that contain nicotine or tobacco. If you need help quitting, ask your dentist. Keep all follow-up visits. Prevent an abscess Brush your teeth every morning and every night. Use fluoride toothpaste. Floss your teeth each day. Get dental cleanings as often as told by your dentist. Think about getting dental sealant put on teeth that have deep holes (decay). Drink water that has fluoride in it. Most tap water has fluoride. Check the label on bottled water to see if it has fluoride in it. Drink water instead of sugary drinks. Eat healthy meals and snacks. Wear a mouth guard or face shield when you play sports. Contact a doctor if: Your pain is worse and medicine does not help. Get help right away if: You have a fever or chills. Your symptoms suddenly get worse. You have a very bad headache. You have problems breathing or swallowing. You have trouble opening your mouth. You have swelling in your neck or close to your eye. These symptoms may be an emergency. Get help right away. Call your local emergency services (911 in the [place] Do not wait to see if the symptoms will go away. Do not drive yourself to the hospital. Summary A dental abscess   is an area of pus in or around a tooth. It is caused by an infection. Treatment will help with symptoms and prevent the infection from spreading. Take over-the-counter and prescription medicines only as told by your dentist. To prevent an abscess, take good care of your teeth. Brush your teeth every morning and night. Use floss every day. Get dental cleanings as often as told by your dentist. This information is  not intended to replace advice given to you by your health care provider. Make sure you discuss any questions you have with your health care provider. Document Revised: 05/13/2020 Document Reviewed: 05/13/2020 Elsevier Patient Education  2023 Elsevier Inc.  

## 2021-10-18 NOTE — Progress Notes (Signed)
Acute Office Visit  Subjective:     Patient ID: Tara Colon, female    DOB: [birth year], 31 y.o.   MRN: [id number]  Chief Complaint  Patient presents with   Abdominal Pain   Dental Pain    Started saturday    Abdominal Pain This is a new problem. The current episode started yesterday. The onset quality is gradual. The problem occurs constantly. The problem has been unchanged. The pain is located in the LUQ and RLQ. The pain is at a severity of 3/10. The pain is mild. The quality of the pain is aching. The abdominal pain does not radiate. Pertinent negatives include no anorexia, arthralgias, constipation, diarrhea or nausea. Nothing aggravates the pain. The pain is relieved by Nothing. She has tried nothing for the symptoms.  Dental Pain  This is a new problem. The current episode started in the past 7 days. The problem occurs constantly. The problem has been unchanged. The pain is at a severity of 5/10. The pain is moderate. She has tried NSAIDs for the symptoms. The treatment provided mild relief.     Review of Systems  Constitutional: Negative.   HENT: Negative.    Eyes: Negative.   Respiratory: Negative.    Cardiovascular: Negative.   Gastrointestinal:  Positive for abdominal pain. Negative for anorexia, constipation, diarrhea and nausea.  Musculoskeletal: Negative.  Negative for arthralgias.  Skin: Negative.   All other systems reviewed and are negative.       Objective:    BP 134/67   Pulse 66   Temp 97.8 F (36.6 C)   Ht 5\' 8"  (1.727 m)   Wt 228 lb (103.4 kg)   LMP 09/24/2021 (Approximate)   SpO2 97%   BMI 34.67 kg/m  BP Readings from Last 3 Encounters:  10/18/21 134/67  09/27/21 108/69  09/15/21 106/60   Wt Readings from Last 3 Encounters:  10/18/21 228 lb (103.4 kg)  09/27/21 225 lb (102.1 kg)  09/15/21 226 lb 2 oz (102.6 kg)      Physical Exam Vitals and nursing note reviewed.  Constitutional:      Appearance: She is well-developed.  HENT:      Head: Normocephalic.     Right Ear: External ear normal.     Left Ear: External ear normal.     Mouth/Throat:     Mouth: Mucous membranes are moist.     Dentition: Dental abscesses present.     Pharynx: Oropharynx is clear.      Comments: Left upper molar Eyes:     Conjunctiva/sclera: Conjunctivae normal.  Cardiovascular:     Pulses: Normal pulses.     Heart sounds: Normal heart sounds.  Pulmonary:     Effort: Pulmonary effort is normal.     Breath sounds: Normal breath sounds.  Abdominal:     General: Bowel sounds are normal.  Skin:    General: Skin is warm.     Findings: No rash.  Neurological:     General: No focal deficit present.     Mental Status: She is alert and oriented to person, place, and time.  Psychiatric:        Behavior: Behavior normal.     No results found for any visits on 10/18/21.      Assessment & Plan:  Patient presents with dental abscess in the left upper molar with tenderness and swelling.  Patient's dental appointment is not until October.  Started patient on doxycycline for 7 days.  Tylenol/ibuprofen for pain.  Take all medication as prescribed follow-up with worsening unresolved symptoms.  For vaginal discharge completed wet prep results pending.  We will treat patient based on lab results.   Problem List Items Addressed This Visit   None Visit Diagnoses     Foul smelling vaginal discharge    -  Primary   Relevant Orders   WET PREP FOR TRICH, YEAST, CLUE   Ct, Ng, Mycoplasmas NAA, Urine   Dysuria       Relevant Orders   Urine Culture   Urinalysis, Complete   Dental abscess       Relevant Medications   doxycycline (VIBRA-TABS) 100 MG tablet       Meds ordered this encounter  Medications   doxycycline (VIBRA-TABS) 100 MG tablet    Sig: Take 1 tablet (100 mg total) by mouth 2 (two) times daily.    Dispense:  14 tablet    Refill:  0    Order Specific Question:   Supervising Provider    Answer:   Mechele Claude [phone]     Return if symptoms worsen or fail to improve.  Daryll Drown, NP

## 2021-10-20 LAB — CT, NG, MYCOPLASMAS NAA, URINE
Chlamydia trachomatis, NAA: NEGATIVE
Mycoplasma genitalium NAA: NEGATIVE
Mycoplasma hominis NAA: NEGATIVE
Neisseria gonorrhoeae, NAA: NEGATIVE
Ureaplasma spp NAA: POSITIVE — AB

## 2021-10-20 LAB — URINE CULTURE

## 2021-10-25 ENCOUNTER — Ambulatory Visit: Payer: Medicaid Other | Attending: Orthopaedic Surgery | Admitting: Physical Therapy

## 2021-10-25 DIAGNOSIS — M5459 Other low back pain: Secondary | ICD-10-CM | POA: Diagnosis not present

## 2021-10-25 DIAGNOSIS — M546 Pain in thoracic spine: Secondary | ICD-10-CM | POA: Insufficient documentation
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
## 2021-10-28 ENCOUNTER — Telehealth: Payer: Self-pay | Admitting: Nurse Practitioner

## 2021-10-28 ENCOUNTER — Ambulatory Visit: Payer: Medicaid Other | Admitting: Adult Health

## 2021-10-28 ENCOUNTER — Encounter: Payer: Self-pay | Admitting: Adult Health

## 2021-10-28 VITALS — BP 122/78 | HR 78 | Ht 68.0 in | Wt 229.0 lb

## 2021-10-28 DIAGNOSIS — Z3041 Encounter for surveillance of contraceptive pills: Secondary | ICD-10-CM | POA: Diagnosis not present

## 2021-10-28 NOTE — Progress Notes (Signed)
  Subjective:     Patient ID: Tara Colon, female   DOB: 11/21/1990, 31 y.o.   MRN: [id number]  HPI Tara Colon is a 31 year old black female,single, [id number] in for follow up on starting lo Loestrin in May and she likes them.  Lab Results  Component Value Date   DIAGPAP  07/21/2020    - Negative for intraepithelial lesion or malignancy (NILM)   HPVHIGH Negative 07/21/2020   PCP is Arnetha Massy NP  Review of Systems Periods good,likes lo loestrin,no complaints    Reviewed past medical,surgical, social and family history. Reviewed medications and allergies.  Objective:   Physical Exam BP 122/78 (BP Location: Right Arm, Patient Position: Sitting, Cuff Size: Normal)   Pulse 78   Ht 5\' 8"  (1.727 m)   Wt 229 lb (103.9 kg)   LMP 10/19/2021   BMI 34.82 kg/m     Skin warm and dry.  Lungs: clear to ausculation bilaterally. Cardiovascular: regular rate and rhythm.   Upstream - 10/28/21 0931       Pregnancy Intention Screening   Does the patient want to become pregnant in the next year? No    Does the patient's partner want to become pregnant in the next year? No    Would the patient like to discuss contraceptive options today? No      Contraception Wrap Up   Current Method Oral Contraceptive    End Method Oral Contraceptive             Assessment:     1. Encounter for surveillance of contraceptive pills She likes lo Loestrin, BP is good, and lost 4 lbs  Continue lo Loestrin, has refills    Plan:     Follow up prn Physical in May 2024

## 2021-10-28 NOTE — Telephone Encounter (Signed)
Our records and the [facility] records only show patient having one HPV vaccine.  Patient does not have a record of her vaccines and cannot provide proof of vaccination, but she is past the age that she can get the HPV vaccine, so I advised patient of this and told her if she could get a copy of her vaccine records we could enter them into her chart but we cannot administer the vaccine because of her age.

## 2021-11-08 ENCOUNTER — Ambulatory Visit: Payer: Medicaid Other

## 2021-11-08 DIAGNOSIS — M546 Pain in thoracic spine: Secondary | ICD-10-CM

## 2021-11-08 DIAGNOSIS — M5459 Other low back pain: Secondary | ICD-10-CM
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
## 2021-11-10 ENCOUNTER — Ambulatory Visit: Payer: Medicaid Other

## 2021-11-10 DIAGNOSIS — M546 Pain in thoracic spine: Secondary | ICD-10-CM | POA: Diagnosis not present

## 2021-11-10 DIAGNOSIS — M5459 Other low back pain: Secondary | ICD-10-CM | POA: Diagnosis not present
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
## 2021-11-14 ENCOUNTER — Encounter: Payer: Self-pay | Admitting: Physical Therapy

## 2021-11-14 ENCOUNTER — Ambulatory Visit: Payer: Medicaid Other | Admitting: Physical Therapy

## 2021-11-14 DIAGNOSIS — M546 Pain in thoracic spine: Secondary | ICD-10-CM

## 2021-11-14 DIAGNOSIS — M5459 Other low back pain: Secondary | ICD-10-CM
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
## 2021-11-17 ENCOUNTER — Ambulatory Visit: Payer: Medicaid Other

## 2021-11-17 DIAGNOSIS — M5459 Other low back pain: Secondary | ICD-10-CM | POA: Diagnosis not present

## 2021-11-17 DIAGNOSIS — M546 Pain in thoracic spine: Secondary | ICD-10-CM
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
## 2021-11-23 ENCOUNTER — Ambulatory Visit: Payer: Medicaid Other | Attending: Orthopaedic Surgery

## 2021-11-23 DIAGNOSIS — M546 Pain in thoracic spine: Secondary | ICD-10-CM | POA: Diagnosis not present

## 2021-11-23 DIAGNOSIS — M5459 Other low back pain: Secondary | ICD-10-CM | POA: Diagnosis not present
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
## 2021-11-28 ENCOUNTER — Telehealth: Payer: Self-pay | Admitting: Nurse Practitioner

## 2021-11-29 NOTE — Telephone Encounter (Signed)
Unfortunately I am unable to excuse her from jury duty at this time, as this is her civil duty and unless patient is having a mental break down and can not function she may contact the courts and have them agree that a doctors not will be sufficient for the excuse. Please let me know how else I can help

## 2021-12-01 ENCOUNTER — Ambulatory Visit: Payer: Medicaid Other | Admitting: *Deleted

## 2021-12-01 ENCOUNTER — Encounter: Payer: Self-pay | Admitting: *Deleted

## 2021-12-01 DIAGNOSIS — M5459 Other low back pain: Secondary | ICD-10-CM | POA: Diagnosis not present

## 2021-12-01 DIAGNOSIS — M546 Pain in thoracic spine: Secondary | ICD-10-CM
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
## 2021-12-16 ENCOUNTER — Encounter: Payer: Self-pay | Admitting: Physical Therapy

## 2021-12-16 ENCOUNTER — Ambulatory Visit: Payer: Medicaid Other | Admitting: Physical Therapy

## 2021-12-16 DIAGNOSIS — M5459 Other low back pain: Secondary | ICD-10-CM | POA: Diagnosis not present

## 2021-12-16 DIAGNOSIS — M546 Pain in thoracic spine: Secondary | ICD-10-CM | POA: Diagnosis not present
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
## 2021-12-30 ENCOUNTER — Ambulatory Visit: Payer: Medicaid Other | Admitting: Nurse Practitioner

## 2021-12-30 ENCOUNTER — Encounter: Payer: Self-pay | Admitting: Nurse Practitioner

## 2021-12-30 VITALS — BP 122/86 | HR 74 | Temp 98.8°F | Ht 68.0 in | Wt 220.4 lb

## 2021-12-30 DIAGNOSIS — M549 Dorsalgia, unspecified: Secondary | ICD-10-CM | POA: Diagnosis not present

## 2021-12-30 DIAGNOSIS — G8929 Other chronic pain: Secondary | ICD-10-CM | POA: Diagnosis not present

## 2021-12-30 NOTE — Patient Instructions (Signed)
Chronic Back Pain When back pain lasts longer than 3 months, it is called chronic back pain. Pain may get worse at certain times (flare-ups). There are things you can do at home to manage your pain. Follow these instructions at home: Pay attention to any changes in your symptoms. Take these actions to help with your pain: Managing pain and stiffness     If told, put ice on the painful area. Your doctor may tell you to use ice for 24-48 hours after the flare-up starts. To do this: Put ice in a plastic bag. Place a towel between your skin and the bag. Leave the ice on for 20 minutes, 2-3 times a day. If told, put heat on the painful area. Do this as often as told by your doctor. Use the heat source that your doctor recommends, such as a moist heat pack or a heating pad. Place a towel between your skin and the heat source. Leave the heat on for 20-30 minutes. Take off the heat if your skin turns bright red. This is especially important if you are unable to feel pain, heat, or cold. You may have a greater risk of getting burned. Soak in a warm bath. This can help relieve pain. Activity  Avoid bending and other activities that make pain worse. When standing: Keep your upper back and neck straight. Keep your shoulders pulled back. Avoid slouching. When sitting: Keep your back straight. Relax your shoulders. Do not round your shoulders or pull them backward. Do not sit or stand in one place for long periods of time. Take short rest breaks during the day. Lying down or standing is usually better than sitting. Resting can help relieve pain. When sitting or lying down for a long time, do some mild activity or stretching. This will help to prevent stiffness and pain. Get regular exercise. Ask your doctor what activities are safe for you. Do not lift anything that is heavier than 10 lb (4.5 kg) or the limit that you are told, until your doctor says that it is safe. To prevent injury when you lift  things: Bend your knees. Keep the weight close to your body. Avoid twisting. Sleep on a firm mattress. Try lying on your side with your knees slightly bent. If you lie on your back, put a pillow under your knees. Medicines Treatment may include medicines for pain and swelling taken by mouth or put on the skin, prescription pain medicine, or muscle relaxants. Take over-the-counter and prescription medicines only as told by your doctor. Ask your doctor if the medicine prescribed to you: Requires you to avoid driving or using machinery. Can cause trouble pooping (constipation). You may need to take these actions to prevent or treat trouble pooping: Drink enough fluid to keep your pee (urine) pale yellow. Take over-the-counter or prescription medicines. Eat foods that are high in fiber. These include beans, whole grains, and fresh fruits and vegetables. Limit foods that are high in fat and sugars. These include fried or sweet foods. General instructions Do not use any products that contain nicotine or tobacco, such as cigarettes, e-cigarettes, and chewing tobacco. If you need help quitting, ask your doctor. Keep all follow-up visits as told by your doctor. This is important. Contact a doctor if: Your pain does not get better with rest or medicine. Your pain gets worse, or you have new pain. You have a high fever. You lose weight very quickly. You have trouble doing your normal activities. Get help right away   if: One or both of your legs or feet feel weak. One or both of your legs or feet lose feeling (have numbness). You have trouble controlling when you poop (have a bowel movement) or pee (urinate). You have bad back pain and: You feel like you may vomit (nauseous), or you vomit. You have pain in your belly (abdomen). You have shortness of breath. You faint. Summary When back pain lasts longer than 3 months, it is called chronic back pain. Pain may get worse at certain times  (flare-ups). Use ice and heat as told by your doctor. Your doctor may tell you to use ice after flare-ups. This information is not intended to replace advice given to you by your health care provider. Make sure you discuss any questions you have with your health care provider. Document Revised: 04/16/2019 Document Reviewed: 04/16/2019 Elsevier Patient Education  2023 [employer]  

## 2021-12-30 NOTE — Progress Notes (Signed)
Acute Office Visit  Subjective:     Patient ID: Tara Colon, female    DOB: Sep 09, 1990, 31 y.o.   MRN: [id number]  Chief Complaint  Patient presents with   Back Pain    Still having some pain about 5 out 10    Back Pain This is a chronic problem. The current episode started more than 1 year ago. The problem occurs constantly. The problem is unchanged. The pain is present in the lumbar spine. The quality of the pain is described as aching. The pain is at a severity of 7/10. The pain is severe. The pain is The same all the time. Stiffness is present All day. Pertinent negatives include no fever. She has tried muscle relaxant and NSAIDs for the symptoms. The treatment provided mild relief.     Review of Systems  Constitutional: Negative.  Negative for fever.  HENT: Negative.    Respiratory: Negative.    Cardiovascular: Negative.   Gastrointestinal: Negative.   Genitourinary: Negative.   Musculoskeletal:  Positive for back pain.  Skin: Negative.   All other systems reviewed and are negative.       Objective:    BP 122/86   Pulse 74   Temp 98.8 F (37.1 C)   Ht 5\' 8"  (1.727 m)   Wt 220 lb 6.4 oz (100 kg)   LMP 12/07/2021   SpO2 95%   BMI 33.51 kg/m  BP Readings from Last 3 Encounters:  12/30/21 122/86  10/28/21 122/78  10/18/21 134/67   Wt Readings from Last 3 Encounters:  12/30/21 220 lb 6.4 oz (100 kg)  10/28/21 229 lb (103.9 kg)  10/18/21 228 lb (103.4 kg)      Physical Exam Vitals and nursing note reviewed.  Constitutional:      Appearance: Normal appearance.  HENT:     Head: Normocephalic.     Right Ear: External ear normal.     Left Ear: External ear normal.     Nose: Nose normal.     Mouth/Throat:     Mouth: Mucous membranes are moist.     Pharynx: Oropharynx is clear.  Eyes:     Conjunctiva/sclera: Conjunctivae normal.  Cardiovascular:     Rate and Rhythm: Normal rate and regular rhythm.     Pulses: Normal pulses.  Pulmonary:      Effort: Pulmonary effort is normal.  Abdominal:     General: Bowel sounds are normal.  Musculoskeletal:        General: No swelling.     Lumbar back: Tenderness present. Decreased range of motion.  Skin:    General: Skin is warm.     Findings: No erythema or rash.  Neurological:     General: No focal deficit present.     Mental Status: She is alert and oriented to person, place, and time.     No results found for any visits on 12/30/21.      Assessment & Plan:  Back pain not well control, patient is currently on Naproxen 500 mg by mouth 2 times daily, and Robaxin 750 mg tablet by mouth. Patient also completed physical therapy. Symptoms improved but not completed. Referral sent to orthopedic, continue Robaxin, discontinue Naproxen, and start ibuprofen 800 mg as needed.   Follow up as needed if symptoms do not improve.    Problem List Items Addressed This Visit   None Visit Diagnoses     Other chronic back pain    -  Primary   Relevant Orders  DG Lumbar Spine 2-3 Views   AMB referral to orthopedics       No orders of the defined types were placed in this encounter.   Return if symptoms worsen or fail to improve.  Ivy Lynn, NP

## 2022-01-19 ENCOUNTER — Other Ambulatory Visit (INDEPENDENT_AMBULATORY_CARE_PROVIDER_SITE_OTHER): Payer: Medicaid Other

## 2022-01-19 ENCOUNTER — Other Ambulatory Visit (HOSPITAL_COMMUNITY)
Admission: RE | Admit: 2022-01-19 | Discharge: 2022-01-19 | Disposition: A | Discharge status: Home / Self Care | Payer: Medicaid Other | Source: Ambulatory Visit | Attending: Obstetrics & Gynecology | Admitting: Obstetrics & Gynecology

## 2022-01-19 DIAGNOSIS — N898 Other specified noninflammatory disorders of vagina: Secondary | ICD-10-CM

## 2022-01-19 DIAGNOSIS — Z113 Encounter for screening for infections with a predominantly sexual mode of transmission: Secondary | ICD-10-CM | POA: Insufficient documentation

## 2022-01-19 NOTE — Progress Notes (Signed)
   NURSE VISIT- VAGINITIS/STD screen  SUBJECTIVE:  Tara Colon is a 31 y.o. [id number] GYN patientfemale here for a vaginal swab for vaginitis screening.  She reports the following symptoms: discharge described as white for 1 month. Denies abnormal vaginal bleeding, significant pelvic pain, fever, or UTI symptoms. Requests all STD screening today including blood work.  OBJECTIVE:  LMP 12/07/2021   Appears well, in no apparent distress  ASSESSMENT: Vaginal swab for vaginitis screening/STD screen  PLAN: Self-collected vaginal probe for Gonorrhea, Chlamydia, Trichomonas, Bacterial Vaginosis, Yeast sent to lab Hep C, Hep B, RPR and HIV ordered per pt request Treatment: to be determined once results are received Follow-up as needed if symptoms persist/worsen, or new symptoms develop  Alice Rieger  01/19/2022 3:00 PM

## 2022-01-20 ENCOUNTER — Encounter: Payer: Self-pay | Admitting: Family Medicine

## 2022-01-20 ENCOUNTER — Ambulatory Visit (INDEPENDENT_AMBULATORY_CARE_PROVIDER_SITE_OTHER): Payer: Medicaid Other | Admitting: Family Medicine

## 2022-01-20 ENCOUNTER — Ambulatory Visit: Payer: Medicaid Other | Admitting: Family Medicine

## 2022-01-20 DIAGNOSIS — J0110 Acute frontal sinusitis, unspecified: Secondary | ICD-10-CM

## 2022-01-20 LAB — HEPATITIS B SURFACE ANTIGEN: Hepatitis B Surface Ag: NEGATIVE

## 2022-01-20 LAB — HIV ANTIBODY (ROUTINE TESTING W REFLEX): HIV Screen 4th Generation wRfx: NONREACTIVE

## 2022-01-20 LAB — HEPATITIS C ANTIBODY: Hep C Virus Ab: NONREACTIVE

## 2022-01-20 LAB — RPR: RPR Ser Ql: NONREACTIVE

## 2022-01-20 MED ORDER — FLUTICASONE PROPIONATE 50 MCG/ACT NA SUSP: 2.0000 | Freq: Every day | NASAL | 6 refills | Status: DC

## 2022-01-20 MED ORDER — DOXYCYCLINE HYCLATE 100 MG PO TABS: 100.0000 mg | ORAL_TABLET | Freq: Two times a day (BID) | ORAL | 0 refills | Status: AC
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
## 2022-01-23 ENCOUNTER — Other Ambulatory Visit: Payer: Self-pay | Admitting: Adult Health

## 2022-01-23 LAB — CERVICOVAGINAL ANCILLARY ONLY
Bacterial Vaginitis (gardnerella): POSITIVE — AB
Candida Glabrata: NEGATIVE
Candida Vaginitis: NEGATIVE
Chlamydia: NEGATIVE
Comment: NEGATIVE
Comment: NEGATIVE
Comment: NEGATIVE
Comment: NEGATIVE
Comment: NEGATIVE
Comment: NORMAL
Neisseria Gonorrhea: NEGATIVE
Trichomonas: NEGATIVE

## 2022-01-23 MED ORDER — METRONIDAZOLE 500 MG PO TABS: 500.0000 mg | ORAL_TABLET | Freq: Two times a day (BID) | ORAL | 0 refills | Status: DC

## 2022-01-23 NOTE — Progress Notes (Signed)
+  BV on vaginal swab will rx flagyl,no sex or alcohol while taking  ?

## 2022-01-24 ENCOUNTER — Telehealth: Payer: Self-pay | Admitting: Nurse Practitioner

## 2022-01-24 NOTE — Telephone Encounter (Signed)
Left vm for cb

## 2022-01-25 NOTE — Telephone Encounter (Signed)
Patient returning call. No additional info given, Please call back.

## 2022-01-25 NOTE — Telephone Encounter (Signed)
Called pt back left vm for cb

## 2022-01-26 ENCOUNTER — Encounter: Payer: Self-pay | Admitting: Orthopaedic Surgery

## 2022-01-26 ENCOUNTER — Ambulatory Visit (INDEPENDENT_AMBULATORY_CARE_PROVIDER_SITE_OTHER): Payer: Medicaid Other | Admitting: Orthopaedic Surgery

## 2022-01-26 VITALS — Ht 68.0 in | Wt 226.0 lb

## 2022-01-26 DIAGNOSIS — Z5321 Procedure and treatment not carried out due to patient leaving prior to being seen by health care provider: Secondary | ICD-10-CM

## 2022-01-31 DIAGNOSIS — Z5321 Procedure and treatment not carried out due to patient leaving prior to being seen by health care provider: Secondary | ICD-10-CM | POA: Insufficient documentation

## 2022-01-31 NOTE — Progress Notes (Signed)
Pt rescheduled

## 2022-02-02 ENCOUNTER — Encounter: Payer: Self-pay | Admitting: Orthopaedic Surgery

## 2022-02-02 ENCOUNTER — Ambulatory Visit (INDEPENDENT_AMBULATORY_CARE_PROVIDER_SITE_OTHER): Payer: Medicaid Other | Admitting: Orthopaedic Surgery

## 2022-02-02 VITALS — Ht 68.0 in | Wt 226.0 lb

## 2022-02-02 DIAGNOSIS — M5489 Other dorsalgia: Secondary | ICD-10-CM | POA: Diagnosis not present
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
## 2022-02-08 NOTE — Telephone Encounter (Signed)
Several attempts have been made to contact patient. This encounter will be closed.  

## 2022-03-03 ENCOUNTER — Ambulatory Visit: Payer: Medicaid Other | Admitting: Nurse Practitioner

## 2022-03-03 ENCOUNTER — Encounter: Payer: Self-pay | Admitting: Nurse Practitioner

## 2022-03-03 VITALS — BP 101/65 | HR 66 | Temp 97.2°F | Resp 20 | Ht 68.0 in | Wt 221.0 lb

## 2022-03-03 DIAGNOSIS — R300 Dysuria: Secondary | ICD-10-CM | POA: Diagnosis not present

## 2022-03-03 LAB — MICROSCOPIC EXAMINATION
RBC, Urine: NONE SEEN /hpf (ref 0–2)
Renal Epithel, UA: NONE SEEN /hpf
WBC, UA: NONE SEEN /hpf (ref 0–5)

## 2022-03-03 LAB — URINALYSIS, COMPLETE
Bilirubin, UA: NEGATIVE
Glucose, UA: NEGATIVE
Ketones, UA: NEGATIVE
Leukocytes,UA: NEGATIVE
Nitrite, UA: NEGATIVE
Specific Gravity, UA: 1.025 (ref 1.005–1.030)
Urobilinogen, Ur: 0.2 mg/dL (ref 0.2–1.0)
pH, UA: 5.5 (ref 5.0–7.5)

## 2022-03-03 MED ORDER — FLUCONAZOLE 150 MG PO TABS: 150.0000 mg | ORAL_TABLET | Freq: Once | ORAL | 0 refills | Status: AC

## 2022-03-03 NOTE — Progress Notes (Signed)
   Subjective:    Patient ID: Tara Colon, female    DOB: 24-May-1990, 31 y.o.   MRN: [id number]   Chief Complaint: urinary symptoms.  Patient saw GYN 2 weeks ago and was dx with BBV. Now sheis having urinary symptoms.  Dysuria  This is a new problem. Episode onset: monday. The problem occurs every urination. The problem has been waxing and waning. The quality of the pain is described as burning. The pain is at a severity of 0/10. There has been no fever. She is Not sexually active. Associated symptoms include a discharge, hesitancy and urgency. Pertinent negatives include no chills.       Review of Systems  Constitutional:  Negative for chills and fever.  Gastrointestinal:  Negative for abdominal pain.  Genitourinary:  Positive for dysuria, hesitancy and urgency.       Objective:   Physical Exam Vitals reviewed.  Constitutional:      Appearance: Normal appearance.  Cardiovascular:     Rate and Rhythm: Normal rate and regular rhythm.     Heart sounds: Normal heart sounds.  Pulmonary:     Breath sounds: Normal breath sounds.  Abdominal:     Tenderness: There is no right CVA tenderness or left CVA tenderness.  Skin:    General: Skin is warm and dry.  Neurological:     General: No focal deficit present.     Mental Status: She is alert and oriented to person, place, and time.  Psychiatric:        Mood and Affect: Mood normal.        Behavior: Behavior normal.     BP 101/65   Pulse 66   Temp (!) 97.2 F (36.2 C) (Temporal)   Resp 20   Ht 5\' 8"  (1.727 m)   Wt 221 lb (100.2 kg)   SpO2 97%   BMI 33.60 kg/m   Urine clear       Assessment & Plan:   Rex Magee in today with chief complaint of No chief complaint on file.   1. Dysuria Force fluids RTO prn - Urinalysis, Complete - Urine Culture    The above assessment and management plan was discussed with the patient. The patient verbalized understanding of and has agreed to the management plan.  Patient is aware to call the clinic if symptoms persist or worsen. Patient is aware when to return to the clinic for a follow-up visit. Patient educated on when it is appropriate to go to the emergency department.   Mary-Margaret [name], FNP

## 2022-03-03 NOTE — Patient Instructions (Signed)

## 2022-03-06 LAB — URINE CULTURE

## 2022-03-06 NOTE — Telephone Encounter (Signed)
Pt calling for results. She is aware provider has not addressed.

## 2022-03-23 ENCOUNTER — Ambulatory Visit: Payer: Medicaid Other | Admitting: Family Medicine

## 2022-03-23 ENCOUNTER — Encounter: Payer: Self-pay | Admitting: Family Medicine

## 2022-03-23 ENCOUNTER — Other Ambulatory Visit (HOSPITAL_COMMUNITY)
Admission: RE | Admit: 2022-03-23 | Discharge: 2022-03-23 | Disposition: A | Discharge status: Home / Self Care | Payer: Medicaid Other | Source: Ambulatory Visit | Attending: Family Medicine | Admitting: Family Medicine

## 2022-03-23 VITALS — BP 120/70 | HR 77 | Temp 97.2°F | Ht 68.0 in | Wt 214.0 lb

## 2022-03-23 DIAGNOSIS — N898 Other specified noninflammatory disorders of vagina: Secondary | ICD-10-CM

## 2022-03-23 DIAGNOSIS — R350 Frequency of micturition: Secondary | ICD-10-CM | POA: Diagnosis not present

## 2022-03-23 DIAGNOSIS — N760 Acute vaginitis: Secondary | ICD-10-CM

## 2022-03-23 DIAGNOSIS — B9689 Other specified bacterial agents as the cause of diseases classified elsewhere: Secondary | ICD-10-CM | POA: Diagnosis not present

## 2022-03-23 LAB — MICROSCOPIC EXAMINATION
Renal Epithel, UA: NONE SEEN /hpf
WBC, UA: NONE SEEN /hpf (ref 0–5)

## 2022-03-23 LAB — URINALYSIS, ROUTINE W REFLEX MICROSCOPIC
Bilirubin, UA: NEGATIVE
Glucose, UA: NEGATIVE
Ketones, UA: NEGATIVE
Leukocytes,UA: NEGATIVE
Nitrite, UA: NEGATIVE
Protein,UA: NEGATIVE
Specific Gravity, UA: 1.01 (ref 1.005–1.030)
Urobilinogen, Ur: 0.2 mg/dL (ref 0.2–1.0)
pH, UA: 6 (ref 5.0–7.5)

## 2022-03-23 LAB — WET PREP FOR TRICH, YEAST, CLUE
Clue Cell Exam: POSITIVE — AB
Trichomonas Exam: NEGATIVE
Yeast Exam: NEGATIVE

## 2022-03-23 MED ORDER — METRONIDAZOLE 500 MG PO TABS: 500.0000 mg | ORAL_TABLET | Freq: Two times a day (BID) | ORAL | 0 refills | Status: AC
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
## 2022-03-23 NOTE — Patient Instructions (Signed)
Healthy vaginal hygiene practices   -  Avoid sleeper pajamas. Nightgowns allow air to circulate.  Sleep without underpants whenever possible.  -  Wear cotton underpants during the day. Double-rinse underwear after washing to avoid residual irritants. Do not use fabric softeners for underwear and swimsuits.  - Avoid tights, leotards, leggings, "skinny" jeans, and other tight-fitting clothing. Skirts and loose-fitting pants allow air to circulate.  - Avoid pantyliners.  Instead use tampons or cotton pads.  - Daily warm bathing is helpful:     - Soak in clean water (no soap) for 10 to 15 minutes.     - Use soap to wash regions other than the genital area just before getting out of the tub or drain water and take a shower to wash your body. Limit use of any soap on genital areas. Use   fragance-free soaps.     - Rinse the genital area well and gently pat dry.  Don't rub.  Hair dryer to assist with drying can be used only if on cool setting.     - Do not use bubble baths or perfumed soaps.  - Do not use any feminine sprays, douches or powders.  These contain chemicals that will irritate the skin.  - If the genital area is tender or swollen, cool compresses may relieve the discomfort. Unscented wet wipes can be used instead of toilet paper for wiping.   - Emollients, such as Vaseline, may help protect skin and can be applied to the irritated area.  - Always remember to wipe front-to-back after bowel movements. Pat dry after urination.  - Do not sit in wet swimsuits for long periods of time after swimming  

## 2022-03-24 LAB — URINE CYTOLOGY ANCILLARY ONLY
Bacterial Vaginitis-Urine: NEGATIVE
Candida Urine: NEGATIVE
Chlamydia: NEGATIVE
Comment: NEGATIVE
Comment: NEGATIVE
Comment: NORMAL
Neisseria Gonorrhea: NEGATIVE
Trichomonas: POSITIVE — AB

## 2022-03-28 NOTE — Addendum Note (Signed)
Addended by: Nigel Berthold C on: 03/28/2022 10:44 AM   Modules accepted: Orders

## 2022-03-31 ENCOUNTER — Other Ambulatory Visit: Payer: Medicaid Other

## 2022-03-31 DIAGNOSIS — N898 Other specified noninflammatory disorders of vagina: Secondary | ICD-10-CM | POA: Diagnosis not present

## 2022-03-31 LAB — WET PREP FOR TRICH, YEAST, CLUE
Clue Cell Exam: NEGATIVE
Trichomonas Exam: NEGATIVE
Yeast Exam: NEGATIVE

## 2022-04-12 ENCOUNTER — Telehealth (INDEPENDENT_AMBULATORY_CARE_PROVIDER_SITE_OTHER): Payer: Medicaid Other | Admitting: Family Medicine

## 2022-04-12 ENCOUNTER — Encounter: Payer: Self-pay | Admitting: Family Medicine

## 2022-04-12 DIAGNOSIS — J0141 Acute recurrent pansinusitis: Secondary | ICD-10-CM

## 2022-04-12 MED ORDER — DOXYCYCLINE HYCLATE 100 MG PO TABS: 100.0000 mg | ORAL_TABLET | Freq: Two times a day (BID) | ORAL | 0 refills | Status: AC

## 2022-04-12 MED ORDER — FLUTICASONE PROPIONATE 50 MCG/ACT NA SUSP: 2.0000 | Freq: Every day | NASAL | 6 refills | Status: DC
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
## 2022-04-20 DIAGNOSIS — R21 Rash and other nonspecific skin eruption: Secondary | ICD-10-CM | POA: Diagnosis not present

## 2022-04-20 DIAGNOSIS — B09 Unspecified viral infection characterized by skin and mucous membrane lesions: Secondary | ICD-10-CM | POA: Diagnosis not present

## 2022-05-03 ENCOUNTER — Encounter: Payer: Self-pay | Admitting: Family Medicine

## 2022-05-03 ENCOUNTER — Ambulatory Visit: Payer: Medicaid Other | Admitting: Family Medicine

## 2022-05-03 VITALS — BP 123/69 | HR 71 | Temp 97.0°F | Ht 68.0 in | Wt 213.0 lb

## 2022-05-03 DIAGNOSIS — F418 Other specified anxiety disorders: Secondary | ICD-10-CM | POA: Diagnosis not present

## 2022-05-03 DIAGNOSIS — L209 Atopic dermatitis, unspecified: Secondary | ICD-10-CM | POA: Insufficient documentation

## 2022-05-03 DIAGNOSIS — Z6832 Body mass index (BMI) 32.0-32.9, adult: Secondary | ICD-10-CM | POA: Diagnosis not present

## 2022-05-03 DIAGNOSIS — E559 Vitamin D deficiency, unspecified: Secondary | ICD-10-CM | POA: Diagnosis not present

## 2022-05-03 MED ORDER — TRIAMCINOLONE ACETONIDE 0.1 % EX CREA: 1.0000 | TOPICAL_CREAM | Freq: Two times a day (BID) | CUTANEOUS | 0 refills | Status: DC

## 2022-05-03 MED ORDER — HYDROXYZINE HCL 10 MG PO TABS: 10.0000 mg | ORAL_TABLET | Freq: Three times a day (TID) | ORAL | 3 refills | Status: DC | PRN

## 2022-05-03 NOTE — Patient Instructions (Signed)
CeraVe or Cetaphil in a jar
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
## 2022-05-04 LAB — CBC WITH DIFFERENTIAL/PLATELET
Basophils Absolute: 0 10*3/uL (ref 0.0–0.2)
Basos: 1 %
EOS (ABSOLUTE): 0.2 10*3/uL (ref 0.0–0.4)
Eos: 4 %
Hematocrit: 40.4 % (ref 34.0–46.6)
Hemoglobin: 13.1 g/dL (ref 11.1–15.9)
Immature Grans (Abs): 0 10*3/uL (ref 0.0–0.1)
Immature Granulocytes: 0 %
Lymphocytes Absolute: 1.6 10*3/uL (ref 0.7–3.1)
Lymphs: 33 %
MCH: 30.1 pg (ref 26.6–33.0)
MCHC: 32.4 g/dL (ref 31.5–35.7)
MCV: 93 fL (ref 79–97)
Monocytes Absolute: 0.7 10*3/uL (ref 0.1–0.9)
Monocytes: 15 %
Neutrophils Absolute: 2.3 10*3/uL (ref 1.4–7.0)
Neutrophils: 47 %
Platelets: 289 10*3/uL (ref 150–450)
RBC: 4.35 x10E6/uL (ref 3.77–5.28)
RDW: 12.5 % (ref 11.7–15.4)
WBC: 4.7 10*3/uL (ref 3.4–10.8)

## 2022-05-04 LAB — CMP14+EGFR
ALT: 24 IU/L (ref 0–32)
AST: 21 IU/L (ref 0–40)
Albumin/Globulin Ratio: 1.8 (ref 1.2–2.2)
Albumin: 4.3 g/dL (ref 4.0–5.0)
Alkaline Phosphatase: 90 IU/L (ref 44–121)
BUN/Creatinine Ratio: 16 (ref 9–23)
BUN: 11 mg/dL (ref 6–20)
Bilirubin Total: <0.2 mg/dL (ref 0.0–1.2)
CO2: 23 mmol/L (ref 20–29)
Calcium: 9.7 mg/dL (ref 8.7–10.2)
Chloride: 105 mmol/L (ref 96–106)
Creatinine, Ser: 0.69 mg/dL (ref 0.57–1.00)
Globulin, Total: 2.4 g/dL (ref 1.5–4.5)
Glucose: 92 mg/dL (ref 70–99)
Potassium: 4.2 mmol/L (ref 3.5–5.2)
Sodium: 142 mmol/L (ref 134–144)
Total Protein: 6.7 g/dL (ref 6.0–8.5)
eGFR: 120 mL/min/{1.73_m2} (ref >59)

## 2022-05-04 LAB — VITAMIN D 25 HYDROXY (VIT D DEFICIENCY, FRACTURES): Vit D, 25-Hydroxy: 20.7 ng/mL — ABNORMAL LOW (ref 30.0–100.0)

## 2022-05-04 LAB — LIPID PANEL
Chol/HDL Ratio: 2.1 ratio (ref 0.0–4.4)
Cholesterol, Total: 118 mg/dL (ref 100–199)
HDL: 57 mg/dL (ref >39)
LDL Chol Calc (NIH): 52 mg/dL (ref 0–99)
Triglycerides: 30 mg/dL (ref 0–149)
VLDL Cholesterol Cal: 9 mg/dL (ref 5–40)

## 2022-05-04 LAB — THYROID PANEL WITH TSH
Free Thyroxine Index: 2.2 (ref 1.2–4.9)
T3 Uptake Ratio: 28 % (ref 24–39)
T4, Total: 7.9 ug/dL (ref 4.5–12.0)
TSH: 1.41 u[IU]/mL (ref 0.450–4.500)

## 2022-05-09 ENCOUNTER — Other Ambulatory Visit (HOSPITAL_COMMUNITY)
Admission: RE | Admit: 2022-05-09 | Discharge: 2022-05-09 | Disposition: A | Discharge status: Home / Self Care | Payer: Medicaid Other | Source: Ambulatory Visit | Attending: Obstetrics & Gynecology | Admitting: Obstetrics & Gynecology

## 2022-05-09 ENCOUNTER — Other Ambulatory Visit (INDEPENDENT_AMBULATORY_CARE_PROVIDER_SITE_OTHER): Payer: Medicaid Other | Admitting: *Deleted

## 2022-05-09 DIAGNOSIS — N898 Other specified noninflammatory disorders of vagina: Secondary | ICD-10-CM | POA: Diagnosis not present

## 2022-05-09 DIAGNOSIS — Z113 Encounter for screening for infections with a predominantly sexual mode of transmission: Secondary | ICD-10-CM | POA: Diagnosis not present

## 2022-05-09 NOTE — Progress Notes (Signed)
   NURSE VISIT- VAGINITIS/STD/POC  SUBJECTIVE:  Tara Colon is a 32 y.o. [id number] GYN patientfemale here for a vaginal swab for STD screen.  She reports the following symptoms: odor for several days. Denies abnormal vaginal bleeding, significant pelvic pain, fever, or UTI symptoms.  OBJECTIVE:  LMP 04/20/2022   Appears well, in no apparent distress  ASSESSMENT: Vaginal swab for STD screen  PLAN: Self-collected vaginal probe for Gonorrhea, Chlamydia, Trichomonas, Bacterial Vaginosis, Yeast sent to lab Treatment: to be determined once results are received Follow-up as needed if symptoms persist/worsen, or new symptoms develop  Janece Canterbury  05/09/2022 1:59 PM

## 2022-05-10 LAB — HEPATITIS B SURFACE ANTIGEN: Hepatitis B Surface Ag: NEGATIVE

## 2022-05-10 LAB — RPR: RPR Ser Ql: NONREACTIVE

## 2022-05-10 LAB — HIV ANTIBODY (ROUTINE TESTING W REFLEX): HIV Screen 4th Generation wRfx: NONREACTIVE

## 2022-05-11 LAB — CERVICOVAGINAL ANCILLARY ONLY
Bacterial Vaginitis (gardnerella): POSITIVE — AB
Candida Glabrata: NEGATIVE
Candida Vaginitis: POSITIVE — AB
Chlamydia: NEGATIVE
Comment: NEGATIVE
Comment: NEGATIVE
Comment: NEGATIVE
Comment: NEGATIVE
Comment: NEGATIVE
Comment: NORMAL
Neisseria Gonorrhea: NEGATIVE
Trichomonas: NEGATIVE

## 2022-05-12 ENCOUNTER — Other Ambulatory Visit: Payer: Self-pay | Admitting: Adult Health

## 2022-05-12 ENCOUNTER — Telehealth: Payer: Self-pay | Admitting: Family Medicine

## 2022-05-12 MED ORDER — METRONIDAZOLE 500 MG PO TABS: 500.0000 mg | ORAL_TABLET | Freq: Two times a day (BID) | ORAL | 0 refills | Status: DC

## 2022-05-12 MED ORDER — FLUCONAZOLE 150 MG PO TABS: ORAL_TABLET | ORAL | 1 refills | Status: DC

## 2022-05-12 NOTE — Progress Notes (Signed)
+  BV and yeast on vaginal swab, will rx flagyl and diflucan, no sex or alcohol while taking

## 2022-05-12 NOTE — Telephone Encounter (Signed)
Patient aware to keep calling OBGYN and if she wants to be treated for BV she will need to have an office visit at our office.

## 2022-05-12 NOTE — Telephone Encounter (Signed)
Pt called requesting to speak with nurse regarding her PAP results from her OBGYN. Says she tested positive for BV and no one from the office will call her back to prescribe her something for it.

## 2022-05-18 DIAGNOSIS — R079 Chest pain, unspecified: Secondary | ICD-10-CM | POA: Diagnosis not present

## 2022-05-18 DIAGNOSIS — Z1152 Encounter for screening for COVID-19: Secondary | ICD-10-CM | POA: Diagnosis not present

## 2022-05-18 DIAGNOSIS — Z20822 Contact with and (suspected) exposure to covid-19: Secondary | ICD-10-CM | POA: Diagnosis not present

## 2022-05-18 DIAGNOSIS — E559 Vitamin D deficiency, unspecified: Secondary | ICD-10-CM | POA: Diagnosis not present

## 2022-05-18 DIAGNOSIS — R531 Weakness: Secondary | ICD-10-CM | POA: Diagnosis not present

## 2022-05-18 DIAGNOSIS — F17210 Nicotine dependence, cigarettes, uncomplicated: Secondary | ICD-10-CM | POA: Diagnosis not present

## 2022-06-26 DIAGNOSIS — N898 Other specified noninflammatory disorders of vagina: Secondary | ICD-10-CM | POA: Diagnosis not present

## 2022-06-30 DIAGNOSIS — Z880 Allergy status to penicillin: Secondary | ICD-10-CM | POA: Diagnosis not present

## 2022-06-30 DIAGNOSIS — Z7722 Contact with and (suspected) exposure to environmental tobacco smoke (acute) (chronic): Secondary | ICD-10-CM | POA: Diagnosis not present

## 2022-06-30 DIAGNOSIS — R109 Unspecified abdominal pain: Secondary | ICD-10-CM | POA: Diagnosis not present

## 2022-06-30 DIAGNOSIS — R1084 Generalized abdominal pain: Secondary | ICD-10-CM | POA: Diagnosis not present

## 2022-06-30 DIAGNOSIS — R110 Nausea: Secondary | ICD-10-CM | POA: Diagnosis not present

## 2022-06-30 DIAGNOSIS — Z87891 Personal history of nicotine dependence: Secondary | ICD-10-CM | POA: Diagnosis not present

## 2022-06-30 DIAGNOSIS — K529 Noninfective gastroenteritis and colitis, unspecified: Secondary | ICD-10-CM | POA: Diagnosis not present

## 2022-08-02 ENCOUNTER — Encounter: Payer: Self-pay | Admitting: Adult Health

## 2022-08-02 ENCOUNTER — Ambulatory Visit (INDEPENDENT_AMBULATORY_CARE_PROVIDER_SITE_OTHER): Payer: Medicaid Other | Admitting: Adult Health

## 2022-08-02 ENCOUNTER — Other Ambulatory Visit (HOSPITAL_COMMUNITY)
Admission: RE | Admit: 2022-08-02 | Discharge: 2022-08-02 | Disposition: A | Discharge status: Home / Self Care | Payer: Medicaid Other | Source: Ambulatory Visit | Attending: Adult Health | Admitting: Adult Health

## 2022-08-02 VITALS — BP 104/66 | HR 71 | Ht 68.0 in | Wt 198.0 lb

## 2022-08-02 DIAGNOSIS — Z30015 Encounter for initial prescription of vaginal ring hormonal contraceptive: Secondary | ICD-10-CM | POA: Diagnosis not present

## 2022-08-02 DIAGNOSIS — Z01419 Encounter for gynecological examination (general) (routine) without abnormal findings: Secondary | ICD-10-CM

## 2022-08-02 DIAGNOSIS — F419 Anxiety disorder, unspecified: Secondary | ICD-10-CM

## 2022-08-02 DIAGNOSIS — Z3202 Encounter for pregnancy test, result negative: Secondary | ICD-10-CM

## 2022-08-02 DIAGNOSIS — N6321 Unspecified lump in the left breast, upper outer quadrant: Secondary | ICD-10-CM | POA: Diagnosis not present

## 2022-08-02 DIAGNOSIS — Z113 Encounter for screening for infections with a predominantly sexual mode of transmission: Secondary | ICD-10-CM | POA: Diagnosis not present

## 2022-08-02 LAB — POCT URINE PREGNANCY: Preg Test, Ur: NEGATIVE

## 2022-08-02 MED ORDER — ETONOGESTREL-ETHINYL ESTRADIOL 0.12-0.015 MG/24HR VA RING: VAGINAL_RING | VAGINAL | 12 refills | Status: DC

## 2022-08-02 MED ORDER — SERTRALINE HCL 50 MG PO TABS: 50.0000 mg | ORAL_TABLET | Freq: Every day | ORAL | 3 refills | Status: DC

## 2022-08-02 NOTE — Progress Notes (Signed)
Patient ID: Tara Colon, female   DOB: 15-Jun-1990, 32 y.o.   MRN: [id number] History of Present Illness: [name] is a 32 year old black female,single, G2P1102, in for a well woman gyn exam and wants to discuss birth control. She wants to be checked for BV  too.   Last pap was negative HPV,NILM 07/21/20.  PCP is Gilford Silvius NP  Current Medications, Allergies, Past Medical History, Past Surgical History, Family History and Social History were reviewed in [name] record.     Review of Systems: Patient denies any headaches, hearing loss, fatigue, blurred vision, shortness of breath, chest pain, abdominal pain, problems with bowel movements, urination, or intercourse. No joint pain or mood swings.     Physical Exam:BP 104/66 (BP Location: Left Arm, Patient Position: Sitting, Cuff Size: Normal)   Pulse 71   Ht 5\' 8"  (1.727 m)   Wt 198 lb (89.8 kg)   LMP 07/24/2022   BMI 30.11 kg/m  UPT is negative  General:  Well developed, well nourished, no acute distress Skin:  Warm and dry Neck:  Midline trachea, normal thyroid, good ROM, no lymphadenopathy Lungs; Clear to auscultation bilaterally Breast:  No dominant palpable mass, retraction, or nipple discharge, on the right, on the left bas oval mass at 2 0'clock, 4 FB from nipple, both nipples chronically inverted Cardiovascular: Regular rate and rhythm Abdomen:  Soft, non tender, no hepatosplenomegaly Pelvic:  External genitalia is normal in appearance, no lesions.  The vagina is normal in appearance. Urethra has no lesions or masses. The cervix is bulbous.  Uterus is felt to be normal size, shape, and contour.  No adnexal masses or tenderness noted.Bladder is non tender, no masses felt.CV swab obtained. Rectal: Deferred Extremities/musculoskeletal:  No swelling or varicosities noted, no clubbing or cyanosis Psych:  No mood changes, alert and cooperative,seems happy AA is 1 Fall risk is low    08/02/2022   10:44 AM  05/03/2022   10:39 AM 03/03/2022   10:48 AM  Depression screen PHQ 2/9  Decreased Interest 0 1 1  Down, Depressed, Hopeless 1 1 2   PHQ - 2 Score 1 2 3   Altered sleeping 2 0 3  Tired, decreased energy 1 0 3  Change in appetite 0 2 0  Feeling bad or failure about yourself  0 0 1  Trouble concentrating 0 0 0  Moving slowly or fidgety/restless 0 0 0  Suicidal thoughts 0 0 0  PHQ-9 Score 4 4 10   Difficult doing work/chores  Somewhat difficult Somewhat difficult       08/02/2022   10:45 AM 05/03/2022   10:40 AM 03/03/2022   10:49 AM 03/03/2022   10:35 AM  GAD 7 : Generalized Anxiety Score  Nervous, Anxious, on Edge 1 3 0 0  Control/stop worrying 2 3 1 1   Worry too much - different things 3 3 1 1   Trouble relaxing 2 1 1 1   Restless 0 0 0 0  Easily annoyed or irritable 2 3 1 1   Afraid - awful might happen 0 0 0 0  Total GAD 7 Score 10 13 4 4   Anxiety Difficulty  Very difficult Somewhat difficult Somewhat difficult      Upstream - 08/02/22 1052       Pregnancy Intention Screening   Does the patient want to become pregnant in the next year? No    Does the patient's partner want to become pregnant in the next year? No    Would the  patient like to discuss contraceptive options today? Yes      Contraception Wrap Up   Current Method Abstinence    End Method Vaginal Ring    Contraception Counseling Provided Yes    How was the end contraceptive method provided? Prescription             Examination chaperoned by Malachy Mood LPN  Impression and Plan: 1. Pregnancy examination or test, negative result  - POCT urine pregnancy  2. Encounter for well woman exam with routine gynecological exam Pap and physical in 1 year  3. Screen for STD (sexually transmitted disease) CV swab sent for GC/CHL,BV yeast and trich at her request Will check HIV and RPR too  - Cervicovaginal ancillary only( [place]) - RPR - HIV Antibody (routine testing w rflx)  4. Encounter for initial  prescription of vaginal ring hormonal contraceptive She denies MI,stroke, DVT,breast cancer or migraine with aura, and wants the ring has used in the past, will rx nuva ring  Can place ring today, use condoms for 1 month   Meds ordered this encounter  Medications   etonogestrel-ethinyl estradiol (NUVARING) 0.12-0.015 MG/24HR vaginal ring    Sig: Insert vaginally and leave in place for 3 consecutive weeks, then remove for 1 week.    Dispense:  1 each    Refill:  12    Order Specific Question:   Supervising Provider    Answer:   Duane Lope H [[id number]]   sertraline (ZOLOFT) 50 MG tablet    Sig: Take 1 tablet (50 mg total) by mouth daily.    Dispense:  30 tablet    Refill:  3    Order Specific Question:   Supervising Provider    Answer:   Duane Lope H [[id number]]   Follow up in 10 weeks for ROS   5. Mass of upper outer quadrant of left breast Diagnostic mammogram and [place] scheduled at [facility] for 08/08/22 at 2:40 pm - [place] LIMITED ULTRASOUND INCLUDING AXILLA RIGHT BREAST; Future - MM 3D DIAGNOSTIC MAMMOGRAM BILATERAL BREAST; Future - [place] LIMITED ULTRASOUND INCLUDING AXILLA LEFT BREAST ; Future  6. Anxiety +anxiety Has taken meds in the past Will rx Zoloft 50 mg 1 daily Follow up in 10 weeks for ROS

## 2022-08-03 ENCOUNTER — Other Ambulatory Visit: Payer: Self-pay | Admitting: Adult Health

## 2022-08-03 LAB — HIV ANTIBODY (ROUTINE TESTING W REFLEX): HIV Screen 4th Generation wRfx: NONREACTIVE

## 2022-08-03 LAB — CERVICOVAGINAL ANCILLARY ONLY
Bacterial Vaginitis (gardnerella): POSITIVE — AB
Candida Glabrata: NEGATIVE
Candida Vaginitis: NEGATIVE
Chlamydia: NEGATIVE
Comment: NEGATIVE
Comment: NEGATIVE
Comment: NEGATIVE
Comment: NEGATIVE
Comment: NEGATIVE
Comment: NORMAL
Neisseria Gonorrhea: NEGATIVE
Trichomonas: NEGATIVE

## 2022-08-03 LAB — RPR: RPR Ser Ql: NONREACTIVE

## 2022-08-03 MED ORDER — METRONIDAZOLE 500 MG PO TABS: 500.0000 mg | ORAL_TABLET | Freq: Two times a day (BID) | ORAL | 0 refills | Status: DC

## 2022-08-08 ENCOUNTER — Ambulatory Visit (HOSPITAL_COMMUNITY)
Admission: RE | Admit: 2022-08-08 | Discharge: 2022-08-08 | Disposition: A | Discharge status: Home / Self Care | Payer: Medicaid Other | Source: Ambulatory Visit | Attending: Adult Health | Admitting: Adult Health

## 2022-08-08 DIAGNOSIS — N6321 Unspecified lump in the left breast, upper outer quadrant: Secondary | ICD-10-CM

## 2022-08-08 DIAGNOSIS — N6489 Other specified disorders of breast: Secondary | ICD-10-CM | POA: Diagnosis not present

## 2022-08-08 DIAGNOSIS — R922 Inconclusive mammogram: Secondary | ICD-10-CM | POA: Diagnosis not present

## 2022-08-21 DIAGNOSIS — N760 Acute vaginitis: Secondary | ICD-10-CM | POA: Diagnosis not present

## 2022-08-21 DIAGNOSIS — B9689 Other specified bacterial agents as the cause of diseases classified elsewhere: Secondary | ICD-10-CM | POA: Diagnosis not present

## 2022-08-25 ENCOUNTER — Ambulatory Visit (INDEPENDENT_AMBULATORY_CARE_PROVIDER_SITE_OTHER): Payer: BC Managed Care – PPO | Admitting: *Deleted

## 2022-08-25 ENCOUNTER — Other Ambulatory Visit: Payer: Self-pay | Admitting: Obstetrics & Gynecology

## 2022-08-25 VITALS — BP 113/68 | HR 67

## 2022-08-25 DIAGNOSIS — N898 Other specified noninflammatory disorders of vagina: Secondary | ICD-10-CM

## 2022-08-25 DIAGNOSIS — N926 Irregular menstruation, unspecified: Secondary | ICD-10-CM

## 2022-08-25 DIAGNOSIS — Z3201 Encounter for pregnancy test, result positive: Secondary | ICD-10-CM

## 2022-08-25 DIAGNOSIS — O219 Vomiting of pregnancy, unspecified: Secondary | ICD-10-CM

## 2022-08-25 LAB — POCT URINE PREGNANCY: Preg Test, Ur: POSITIVE — AB

## 2022-08-25 MED ORDER — DOXYLAMINE-PYRIDOXINE 10-10 MG PO TBEC
2.0000 | DELAYED_RELEASE_TABLET | Freq: Every day | ORAL | 6 refills | Status: AC
Start: 2022-08-25 — End: 2022-09-24

## 2022-08-25 NOTE — Progress Notes (Addendum)
   NURSE VISIT- PREGNANCY CONFIRMATION   SUBJECTIVE:  Tara Colon is a 32 y.o. [phone] female at [redacted]w[redacted]d by certain LMP of Patient's last menstrual period was 07/24/2022. Here for pregnancy confirmation.  Home pregnancy test: positive x 3   She reports nausea and cramping.  She is taking prenatal vitamins.    OBJECTIVE:  BP 113/68 (BP Location: Right Arm, Patient Position: Sitting, Cuff Size: Normal)   Pulse 67   LMP 07/24/2022   Appears well, in no apparent distress  Results for orders placed or performed in visit on 08/25/22 (from the past 24 hour(s))  POCT urine pregnancy   Collection Time: 08/25/22 10:58 AM  Result Value Ref Range   Preg Test, Ur Positive (A) Negative    ASSESSMENT: Positive pregnancy test, [redacted]w[redacted]d by LMP    PLAN: Schedule for dating ultrasound in 4 weeks Prenatal vitamins: continue   Nausea medicines: requested-note routed to Myna Hidalgo, DO to send prescription   OB packet given: Yes  Jobe Marker  08/25/2022 11:37 AM   -Diclegis sent in -pt to call if no improvement.  Chart reviewed for nurse visit. Agree with plan of care.  Myna Hidalgo, DO

## 2022-08-25 NOTE — Addendum Note (Signed)
Addended by: Sharon Seller on: 08/25/2022 11:51 AM   Modules accepted: Orders

## 2022-08-29 ENCOUNTER — Encounter (HOSPITAL_COMMUNITY): Payer: Self-pay | Admitting: *Deleted

## 2022-08-29 ENCOUNTER — Other Ambulatory Visit: Payer: Self-pay

## 2022-08-29 ENCOUNTER — Emergency Department (HOSPITAL_COMMUNITY)
Admission: EM | Admit: 2022-08-29 | Discharge: 2022-08-29 | Discharge status: Against Medical Advice | Payer: BC Managed Care – PPO | Attending: Emergency Medicine | Admitting: Emergency Medicine

## 2022-08-29 DIAGNOSIS — Z5321 Procedure and treatment not carried out due to patient leaving prior to being seen by health care provider: Secondary | ICD-10-CM | POA: Diagnosis not present

## 2022-08-29 DIAGNOSIS — O209 Hemorrhage in early pregnancy, unspecified: Secondary | ICD-10-CM | POA: Diagnosis not present

## 2022-08-29 DIAGNOSIS — Z3A01 Less than 8 weeks gestation of pregnancy: Secondary | ICD-10-CM | POA: Insufficient documentation

## 2022-08-29 LAB — URINALYSIS, ROUTINE W REFLEX MICROSCOPIC
Bacteria, UA: NONE SEEN
Bilirubin Urine: NEGATIVE
Glucose, UA: NEGATIVE mg/dL
Ketones, ur: NEGATIVE mg/dL
Leukocytes,Ua: NEGATIVE
Nitrite: NEGATIVE
Protein, ur: 30 mg/dL — AB
RBC / HPF: >50 RBC/hpf (ref 0–5)
Specific Gravity, Urine: 1.024 (ref 1.005–1.030)
pH: 6 (ref 5.0–8.0)

## 2022-08-29 LAB — HCG, QUANTITATIVE, PREGNANCY: hCG, Beta Chain, Quant, S: 3604 m[IU]/mL — ABNORMAL HIGH (ref <5)

## 2022-08-29 NOTE — ED Triage Notes (Signed)
Pt with vaginal bleeding that started today, with clots per pt. Pt states she was [redacted] weeks pregnant.

## 2022-08-29 NOTE — ED Notes (Signed)
Went to check on this pt and she was not in the room. Spoke to registration and unaware if pt walked by them.

## 2022-08-29 NOTE — ED Notes (Signed)
Pt unable to be located. PA notified.

## 2022-09-01 LAB — NUSWAB BV AND CANDIDA, NAA

## 2022-09-01 LAB — SPECIMEN STATUS REPORT

## 2022-09-03 LAB — NUSWAB BV AND CANDIDA, NAA
Candida albicans, NAA: NEGATIVE
Candida glabrata, NAA: NEGATIVE

## 2022-09-15 ENCOUNTER — Other Ambulatory Visit: Payer: BC Managed Care – PPO

## 2022-10-11 ENCOUNTER — Ambulatory Visit: Payer: Medicaid Other | Admitting: Adult Health

## 2022-11-01 ENCOUNTER — Ambulatory Visit: Payer: Medicaid Other | Admitting: Family Medicine

## 2022-11-01 ENCOUNTER — Ambulatory Visit: Payer: Medicaid Other | Admitting: Adult Health

## 2022-11-02 ENCOUNTER — Ambulatory Visit (INDEPENDENT_AMBULATORY_CARE_PROVIDER_SITE_OTHER): Payer: BC Managed Care – PPO | Admitting: Family Medicine

## 2022-11-02 ENCOUNTER — Encounter: Payer: Self-pay | Admitting: Family Medicine

## 2022-11-02 VITALS — BP 111/72 | HR 70 | Temp 97.6°F | Ht 68.0 in | Wt 189.4 lb

## 2022-11-02 DIAGNOSIS — J0100 Acute maxillary sinusitis, unspecified: Secondary | ICD-10-CM | POA: Diagnosis not present

## 2022-11-02 MED ORDER — GUAIFENESIN ER 600 MG PO TB12
600.0000 mg | ORAL_TABLET | Freq: Two times a day (BID) | ORAL | 0 refills | Status: AC
Start: 2022-11-02 — End: 2022-11-12

## 2022-11-02 MED ORDER — DOXYCYCLINE HYCLATE 100 MG PO TABS
100.0000 mg | ORAL_TABLET | Freq: Two times a day (BID) | ORAL | 0 refills | Status: AC
Start: 2022-11-02 — End: 2022-11-12

## 2022-11-02 NOTE — Progress Notes (Signed)
Subjective:  Patient ID: Tara Colon, female    DOB: 1991/01/25, 32 y.o.   MRN: [id number]  Patient Care Team: Sonny Masters, FNP as PCP - General (Family Medicine) Moss Mc, RN as Registered Nurse Annamarie Dawley, LPN as Licensed Practical Nurse Colen Darling, LPN   Chief Complaint:  facial pressure and Headache (1 week )   HPI: Tara Colon is a 32 y.o. female presenting on 11/02/2022 for facial pressure and Headache (1 week )   Sinus Problem This is a new problem. The current episode started 1 to 4 weeks ago. The problem has been gradually worsening since onset. There has been no fever. The pain is moderate. Associated symptoms include congestion, coughing, ear pain, headaches and sinus pressure. Pertinent negatives include no chills, diaphoresis, hoarse voice, neck pain, shortness of breath, sneezing, sore throat or swollen glands. Past treatments include nasal decongestants. The treatment provided no relief.        Relevant past medical, surgical, family, and social history reviewed and updated as indicated.  Allergies and medications reviewed and updated. Data reviewed: Chart in Epic.   Past Medical History:  Diagnosis Date   Anxiety    Bipolar affect, depressed (HCC)    BV (bacterial vaginosis)    Depression    HSV (herpes simplex virus) infection    pt reports no outbreaks   Insomnia     Past Surgical History:  Procedure Laterality Date   NO PAST SURGERIES      Social History   Socioeconomic History   Marital status: Single    Spouse name: Not on file   Number of children: Not on file   Years of education: Not on file   Highest education level: Not on file  Occupational History   Not on file  Tobacco Use   Smoking status: Every Day    Current packs/day: 1.00    Average packs/day: 1 pack/day for 12.0 years (12.0 ttl pk-yrs)    Types: Cigarettes   Smokeless tobacco: Never  Vaping Use   Vaping status: Some Days  Substance and  Sexual Activity   Alcohol use: Yes    Alcohol/week: 14.0 standard drinks of alcohol    Types: 14 Cans of beer per week    Comment: occ   Drug use: Not Currently    Types: Marijuana   Sexual activity: Yes    Birth control/protection: None  Other Topics Concern   Not on file  Social History Narrative   Not on file   Social Determinants of Health   Financial Resource Strain: Low Risk  (08/02/2022)   Overall Financial Resource Strain (CARDIA)    Difficulty of Paying Living Expenses: Not hard at all  Food Insecurity: Food Insecurity Present (08/02/2022)   Hunger Vital Sign    Worried About Running Out of Food in the Last Year: Never true    Ran Out of Food in the Last Year: Sometimes true  Transportation Needs: No Transportation Needs (08/02/2022)   PRAPARE - Administrator, Civil Service (Medical): No    Lack of Transportation (Non-Medical): No  Physical Activity: Sufficiently Active (08/02/2022)   Exercise Vital Sign    Days of Exercise per Week: 7 days    Minutes of Exercise per Session: 100 min  Stress: Stress Concern Present (08/02/2022)   [employer] of Occupational Health - Occupational Stress Questionnaire    Feeling of Stress : Rather much  Social Connections: Socially Isolated (08/02/2022)  Social Advertising account executive [NHANES]    Frequency of Communication with Friends and Family: More than three times a week    Frequency of Social Gatherings with Friends and Family: More than three times a week    Attends Religious Services: Never    Database administrator or Organizations: No    Attends Banker Meetings: Never    Marital Status: Never married  Intimate Partner Violence: Not At Risk (08/02/2022)   Humiliation, Afraid, Rape, and Kick questionnaire    Fear of Current or Ex-Partner: No    Emotionally Abused: No    Physically Abused: No    Sexually Abused: No    Outpatient Encounter Medications as of 11/02/2022  Medication Sig    doxycycline (VIBRA-TABS) 100 MG tablet Take 1 tablet (100 mg total) by mouth 2 (two) times daily for 10 days. 1 po bid   fluticasone (FLONASE) 50 MCG/ACT nasal spray Place 2 sprays into both nostrils daily.   guaiFENesin (MUCINEX) 600 MG 12 hr tablet Take 1 tablet (600 mg total) by mouth 2 (two) times daily for 10 days.   hydrOXYzine (ATARAX) 10 MG tablet Take 1 tablet (10 mg total) by mouth 3 (three) times daily as needed.   Prenatal Vit-Fe Fumarate-FA (PRENATAL VITAMIN PO) Take by mouth.   sertraline (ZOLOFT) 50 MG tablet Take 1 tablet (50 mg total) by mouth daily.   No facility-administered encounter medications on file as of 11/02/2022.    Allergies  Allergen Reactions   Amoxicillin Rash    "feel bad"   Bactrim [Sulfamethoxazole-Trimethoprim] Rash    "feels bad"     Review of Systems  Constitutional:  Positive for activity change, appetite change and fatigue. Negative for chills, diaphoresis, fever and unexpected weight change.  HENT:  Positive for congestion, ear pain, postnasal drip, sinus pressure and sinus pain. Negative for dental problem, drooling, ear discharge, facial swelling, hearing loss, hoarse voice, mouth sores, nosebleeds, rhinorrhea, sneezing, sore throat, tinnitus, trouble swallowing and voice change.   Eyes:  Negative for photophobia.  Respiratory:  Positive for cough. Negative for apnea, choking, chest tightness, shortness of breath, wheezing and stridor.   Cardiovascular:  Negative for chest pain, palpitations and leg swelling.  Gastrointestinal:  Negative for abdominal pain.  Genitourinary:  Negative for decreased urine volume and difficulty urinating.  Musculoskeletal:  Negative for neck pain.  Neurological:  Positive for headaches. Negative for dizziness, tremors, seizures, syncope, facial asymmetry, speech difficulty, weakness, light-headedness and numbness.  Psychiatric/Behavioral:  Negative for confusion.   All other systems reviewed and are negative.        Objective:  BP 111/72   Pulse 70   Temp 97.6 F (36.4 C) (Temporal)   Ht 5\' 8"  (1.727 m)   Wt 189 lb 6.4 oz (85.9 kg)   LMP 07/24/2022   SpO2 98%   BMI 28.80 kg/m    Wt Readings from Last 3 Encounters:  11/02/22 189 lb 6.4 oz (85.9 kg)  08/29/22 198 lb (89.8 kg)  08/02/22 198 lb (89.8 kg)    Physical Exam Vitals and nursing note reviewed.  Constitutional:      General: She is not in acute distress.    Appearance: Normal appearance. She is well-developed. She is not ill-appearing or diaphoretic.  HENT:     Head: Normocephalic and atraumatic.     Right Ear: Hearing, ear canal and external ear normal. A middle ear effusion is present.     Left Ear: Hearing, ear canal  and external ear normal. A middle ear effusion is present.     Nose: Congestion present. No rhinorrhea.     Right Turbinates: Enlarged.     Left Turbinates: Enlarged.     Right Sinus: Maxillary sinus tenderness present. No frontal sinus tenderness.     Left Sinus: Maxillary sinus tenderness present. No frontal sinus tenderness.     Mouth/Throat:     Lips: Pink.     Mouth: Mucous membranes are moist.     Pharynx: Posterior oropharyngeal erythema and postnasal drip present. No pharyngeal swelling, oropharyngeal exudate or uvula swelling.  Eyes:     Conjunctiva/sclera: Conjunctivae normal.     Pupils: Pupils are equal, round, and reactive to light.  Cardiovascular:     Rate and Rhythm: Normal rate and regular rhythm.     Heart sounds: Normal heart sounds.  Pulmonary:     Effort: Pulmonary effort is normal.     Breath sounds: Normal breath sounds.  Musculoskeletal:     Cervical back: Normal range of motion and neck supple.     Right lower leg: No edema.     Left lower leg: No edema.  Skin:    General: Skin is warm and dry.     Capillary Refill: Capillary refill takes less than 2 seconds.  Neurological:     General: No focal deficit present.     Mental Status: She is alert and oriented to person, place,  and time.  Psychiatric:        Mood and Affect: Mood normal.        Behavior: Behavior normal.        Thought Content: Thought content normal.        Judgment: Judgment normal.     Results for orders placed or performed during the hospital encounter of 08/29/22  hCG, quantitative, pregnancy  Result Value Ref Range   hCG, Beta Chain, Quant, S 3,604 (H) <5 mIU/mL  Urinalysis, Routine w reflex microscopic -Urine, Clean Catch  Result Value Ref Range   Color, Urine YELLOW YELLOW   APPearance HAZY (A) CLEAR   Specific Gravity, Urine 1.024 1.005 - 1.030   pH 6.0 5.0 - 8.0   Glucose, UA NEGATIVE NEGATIVE mg/dL   Hgb urine dipstick LARGE (A) NEGATIVE   Bilirubin Urine NEGATIVE NEGATIVE   Ketones, ur NEGATIVE NEGATIVE mg/dL   Protein, ur 30 (A) NEGATIVE mg/dL   Nitrite NEGATIVE NEGATIVE   Leukocytes,Ua NEGATIVE NEGATIVE   RBC / HPF >50 0 - 5 RBC/hpf   WBC, UA 0-5 0 - 5 WBC/hpf   Bacteria, UA NONE SEEN NONE SEEN   Squamous Epithelial / HPF 6-10 0 - 5 /HPF   Mucus PRESENT        Pertinent labs & imaging results that were available during my care of the patient were reviewed by me and considered in my medical decision making.  Assessment & Plan:  Chris was seen today for facial pressure and headache.  Diagnoses and all orders for this visit:  Acute non-recurrent maxillary sinusitis Continue Flonase and add below as pt has been symptomatic for over 7 days. Increase water intake. Report new, worsening, or persistent symptoms.  -     guaiFENesin (MUCINEX) 600 MG 12 hr tablet; Take 1 tablet (600 mg total) by mouth 2 (two) times daily for 10 days. -     doxycycline (VIBRA-TABS) 100 MG tablet; Take 1 tablet (100 mg total) by mouth 2 (two) times daily for 10 days. 1 po  bid     Continue all other maintenance medications.  Follow up plan: Return if symptoms worsen or fail to improve.   Continue healthy lifestyle choices, including diet (rich in fruits, vegetables, and lean proteins,  and low in salt and simple carbohydrates) and exercise (at least 30 minutes of moderate physical activity daily).  Educational handout given for sinusitis   The above assessment and management plan was discussed with the patient. The patient verbalized understanding of and has agreed to the management plan. Patient is aware to call the clinic if they develop any new symptoms or if symptoms persist or worsen. Patient is aware when to return to the clinic for a follow-up visit. Patient educated on when it is appropriate to go to the emergency department.   Kari Baars, FNP-C [facility] [phone]

## 2022-11-29 ENCOUNTER — Other Ambulatory Visit (INDEPENDENT_AMBULATORY_CARE_PROVIDER_SITE_OTHER): Payer: BC Managed Care – PPO

## 2022-11-29 ENCOUNTER — Other Ambulatory Visit (HOSPITAL_COMMUNITY)
Admission: RE | Admit: 2022-11-29 | Discharge: 2022-11-29 | Disposition: A | Discharge status: Home / Self Care | Payer: BC Managed Care – PPO | Source: Ambulatory Visit | Attending: Obstetrics & Gynecology | Admitting: Obstetrics & Gynecology

## 2022-11-29 DIAGNOSIS — Z113 Encounter for screening for infections with a predominantly sexual mode of transmission: Secondary | ICD-10-CM

## 2022-11-29 NOTE — Progress Notes (Signed)
   NURSE VISIT- VAGINITIS/STD/POC  SUBJECTIVE:  Tara Colon is a 32 y.o. [id number] GYN patientfemale here for a vaginal swab for STD screen.  She reports the following symptoms: none  Denies abnormal vaginal bleeding, significant pelvic pain, fever, or UTI symptoms.  OBJECTIVE:  LMP 07/24/2022   Appears well, in no apparent distress  ASSESSMENT: Vaginal swab for STD screen  PLAN: Self-collected vaginal probe for Gonorrhea, Chlamydia, Trichomonas, Bacterial Vaginosis, Yeast sent to lab Patient given lab slip for HIV, RPR, Hepatitis B and C per her request. Treatment: to be determined once results are received Follow-up as needed if symptoms persist/worsen, or new symptoms develop  Caralyn Guile  11/29/2022 8:43 AM

## 2022-11-30 ENCOUNTER — Other Ambulatory Visit: Payer: BC Managed Care – PPO

## 2022-11-30 LAB — CERVICOVAGINAL ANCILLARY ONLY
Bacterial Vaginitis (gardnerella): NEGATIVE
Candida Glabrata: NEGATIVE
Candida Vaginitis: NEGATIVE
Chlamydia: NEGATIVE
Comment: NEGATIVE
Comment: NEGATIVE
Comment: NEGATIVE
Comment: NEGATIVE
Comment: NEGATIVE
Comment: NORMAL
Neisseria Gonorrhea: NEGATIVE
Trichomonas: NEGATIVE

## 2022-11-30 LAB — HEPATITIS C ANTIBODY: Hep C Virus Ab: NONREACTIVE

## 2022-11-30 LAB — RPR: RPR Ser Ql: NONREACTIVE

## 2022-11-30 LAB — HEPATITIS B SURFACE ANTIGEN: Hepatitis B Surface Ag: NEGATIVE

## 2022-11-30 LAB — HIV ANTIBODY (ROUTINE TESTING W REFLEX): HIV Screen 4th Generation wRfx: NONREACTIVE

## 2022-12-04 ENCOUNTER — Ambulatory Visit (INDEPENDENT_AMBULATORY_CARE_PROVIDER_SITE_OTHER): Payer: BC Managed Care – PPO

## 2022-12-04 DIAGNOSIS — Z23 Encounter for immunization: Secondary | ICD-10-CM

## 2022-12-06 ENCOUNTER — Encounter: Payer: Self-pay | Admitting: *Deleted

## 2022-12-11 ENCOUNTER — Telehealth: Payer: Self-pay | Admitting: Adult Health

## 2022-12-11 ENCOUNTER — Other Ambulatory Visit: Payer: Self-pay | Admitting: Adult Health

## 2022-12-11 NOTE — Telephone Encounter (Signed)
Call could not be completed. [employer] will not cover nuva ring, generic.if pt wants will try name brand.

## 2022-12-15 IMAGING — DX DG CHEST 2V
2 series · 2 of 2 positions shown · non-contrast
Comparison: February 14, 2020.

CLINICAL DATA: Cough.

EXAM:
CHEST - 2 VIEW

[w chest pa]
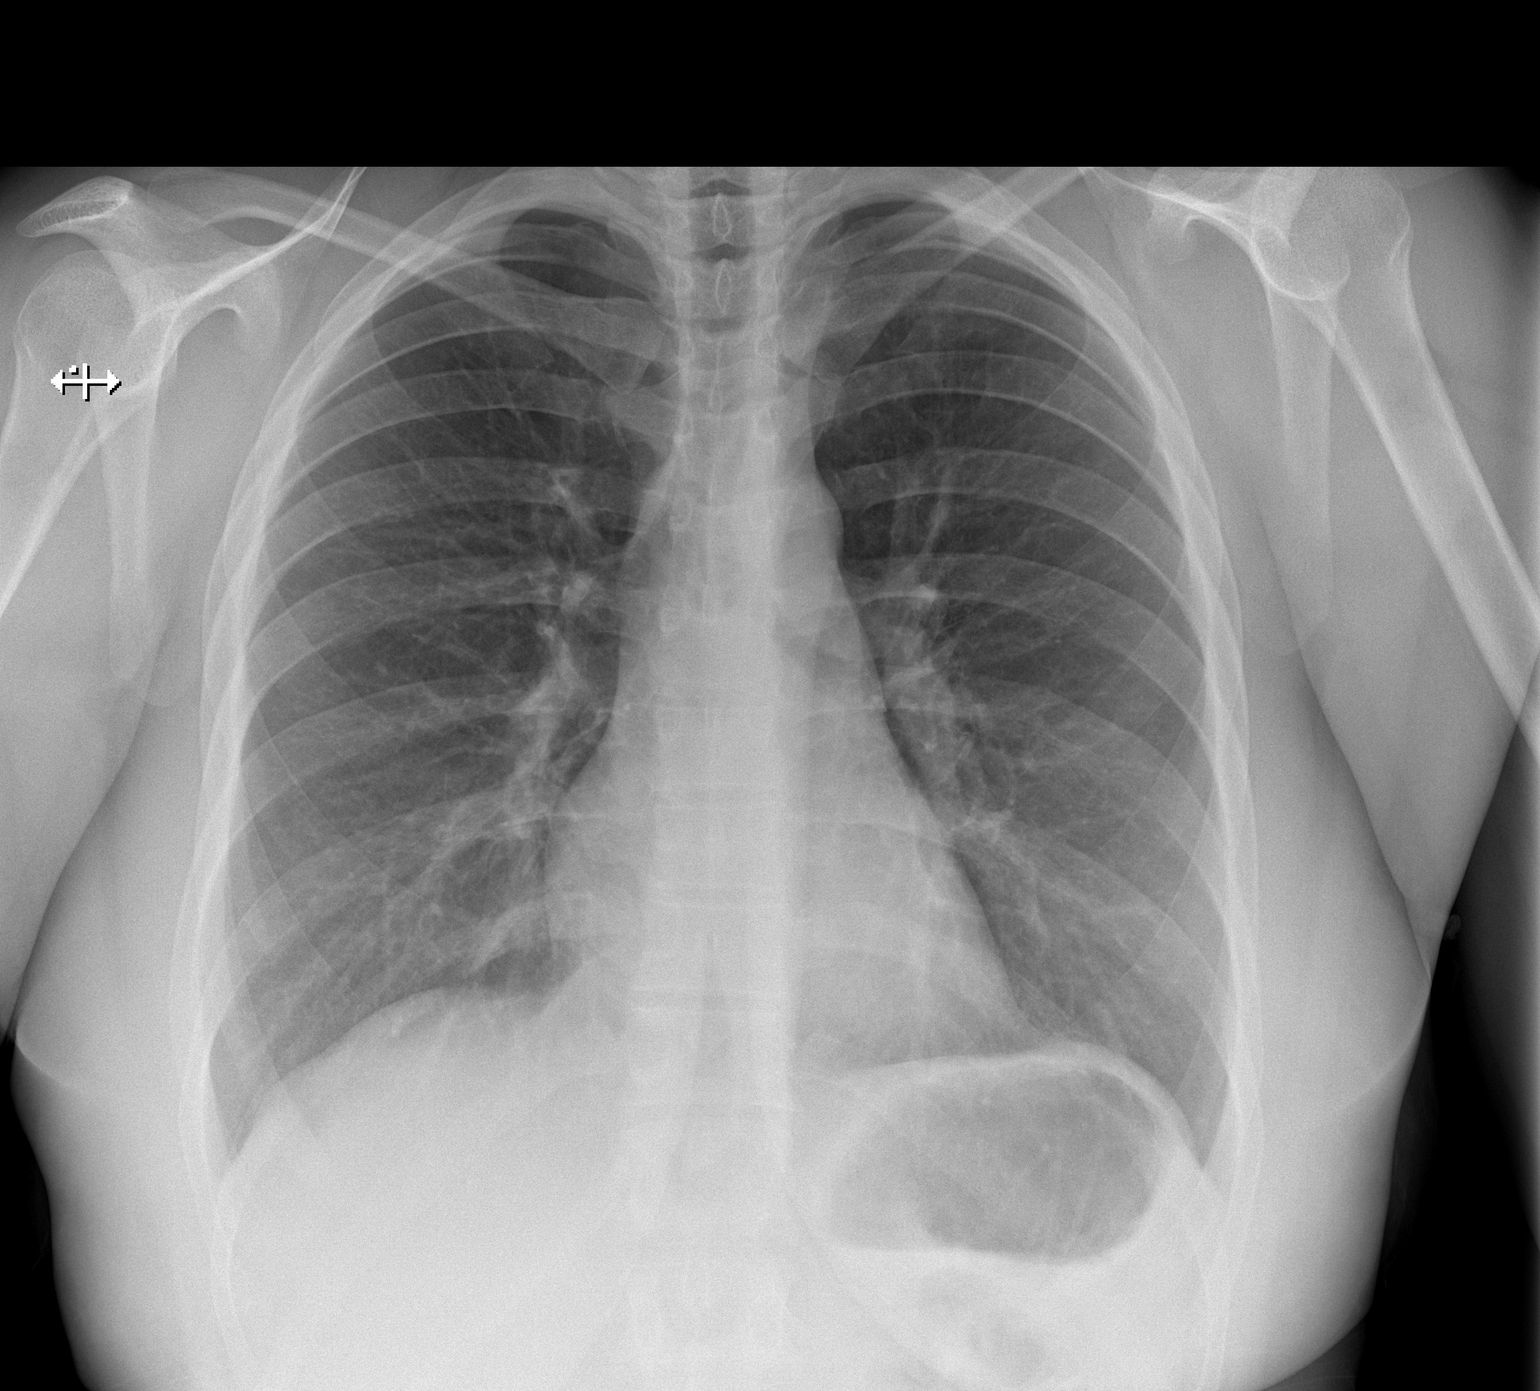

[w chest lat]
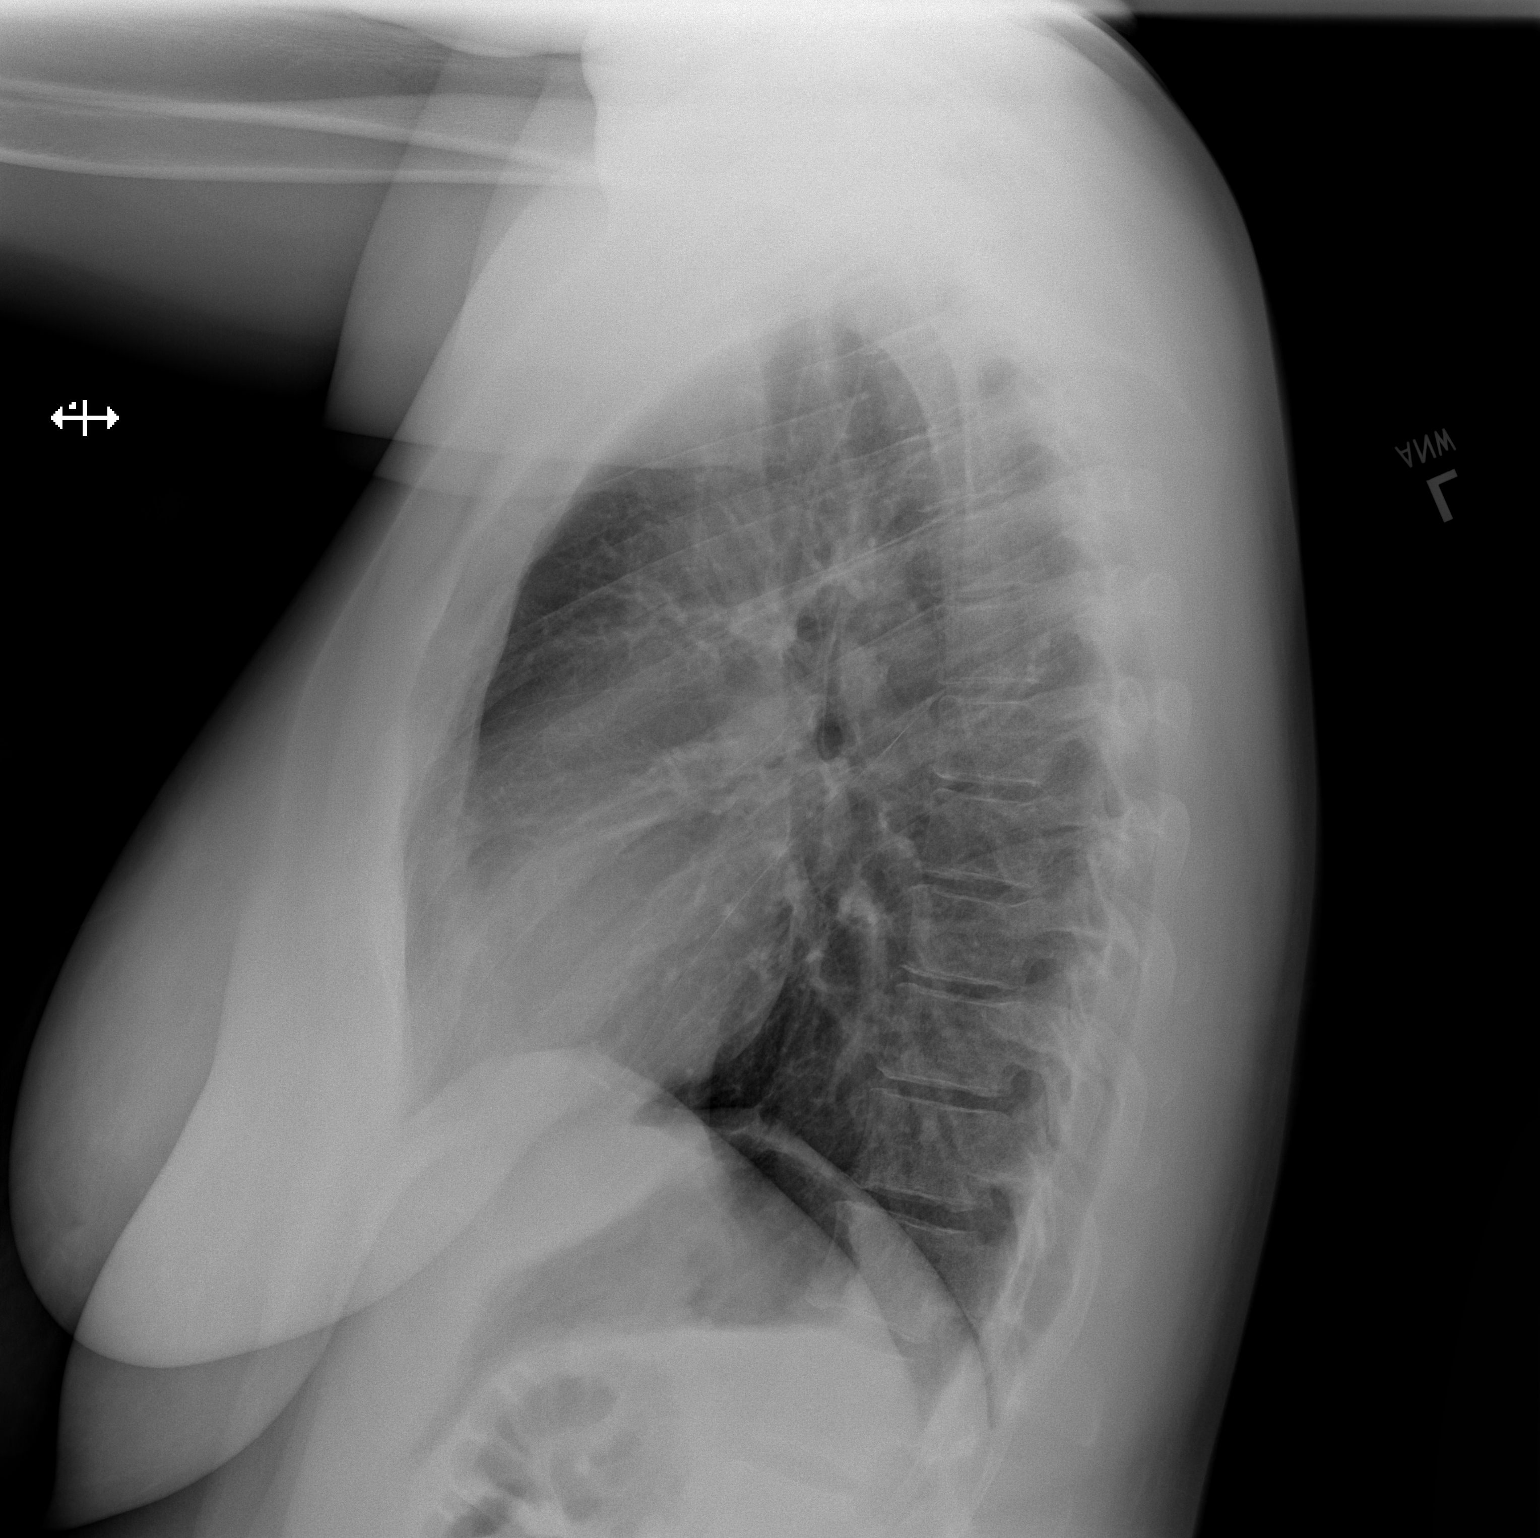

[2 of 2 positions shown; findings below may reference images not displayed]

FINDINGS: The heart size and mediastinal contours are within normal limits.
Both lungs are clear. The visualized skeletal structures are
unremarkable.
IMPRESSION: No active cardiopulmonary disease.

## 2023-01-03 ENCOUNTER — Ambulatory Visit (INDEPENDENT_AMBULATORY_CARE_PROVIDER_SITE_OTHER): Payer: BC Managed Care – PPO | Admitting: Nurse Practitioner

## 2023-01-03 ENCOUNTER — Encounter: Payer: Self-pay | Admitting: Nurse Practitioner

## 2023-01-03 VITALS — BP 107/65 | HR 80 | Temp 97.3°F | Ht 68.0 in | Wt 191.2 lb

## 2023-01-03 DIAGNOSIS — J0110 Acute frontal sinusitis, unspecified: Secondary | ICD-10-CM

## 2023-01-03 MED ORDER — DOXYCYCLINE HYCLATE 100 MG PO TABS
100.0000 mg | ORAL_TABLET | Freq: Two times a day (BID) | ORAL | 0 refills | Status: DC
Start: 2023-01-03 — End: 2023-01-17

## 2023-01-03 NOTE — Progress Notes (Signed)
Acute Office Visit  Subjective:     Patient ID: Tara Colon, female    DOB: 1991-03-01, 32 y.o.   MRN: [id number]  Chief Complaint  Patient presents with   Nasal Congestion    Nasal congestion and pressure in face for about 2 weeks    HPI Tara Colon is 32 yrs old African American female present on 01/03/23 for acute visit concerns for sinusitis She was on the phone during the encounter, got upset when I ask her to get HCG or sign a waiver because I was prescribing her Doxy " No one here never ask me for pregnancy, not sure why you need it that". Explain to her that I just want to be safe.  " I want you to remove that I am pregnant on my chart because I had to call my dr in [place] last week to let them know I was not pregnant".  Sinusitis: Patient presents with chronic sinusitis. The patient reports chronic sinus infections for 2 weeks.  Her symptoms include nasal congestion, clear rhinorrhea.  There has not been a history of nasal congestion, purulent rhinorrhea. There has not been a history of chronic otitis media or pharyngotonsillitis.  Prior antibiotic therapy has included none. Other medications have included nothing additional.  She does not have had allergy testing which was not done.  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   Smoker 07/16/2018   Depression with anxiety 07/16/2018   Marijuana use 07/17/2018   History of preterm delivery 12/14/2018   Alopecia 01/29/2020   Anxiety 02/23/2020   Encounter for initial prescription of vaginal ring hormonal contraceptive 09/28/2020   Alcohol use, 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HCC) 04/01/2021   Screen for STD (sexually transmitted disease) 07/28/2021   Atopic dermatitis in adult 05/03/2022   BMI 32.0-32.9,adult 05/03/2022    Encounter for well woman exam with routine gynecological exam 08/02/2022   Pregnancy examination or test, negative result 08/02/2022   Mass of upper outer quadrant of left breast 08/02/2022   Resolved Ambulatory Problems    Diagnosis Date Noted   Bacterial vaginosis 02/20/2017   Dysuria 02/20/2017   Female fertility problems 02/11/2018   Encounter for smoking cessation counseling 05/22/2018   Less than [redacted] weeks gestation of pregnancy 05/22/2018   Pregnancy examination or test, positive result 05/22/2018   Supervision of normal pregnancy 07/16/2018   GBS bacteriuria 10/29/2018   Supervision of high risk pregnancy, antepartum 12/06/2018   Proteinuria 12/06/2018   BV (bacterial vaginosis) 08/06/2019   Vaginal discharge 08/06/2019   Vaginal irritation 08/06/2019   Itching of vulva 08/06/2019   Encounter for female birth control 01/29/2020   Need for immunization against influenza 02/23/2020   Other chest pain 02/23/2020   Acute bilateral low back pain without sciatica 03/29/2020   Paresthesia of both legs 06/08/2020   Folliculitis 06/23/2020   Recurrent boils 06/23/2020   Encounter for gynecological examination with Papanicolaou smear of cervix 07/21/2020   Screening examination for STD (sexually transmitted disease) 07/21/2020   Routine general medical examination at a health care facility 07/21/2020   Pregnancy test negative 09/28/2020   Encounter for follow-up 01/05/2021   Vesicles 02/03/2021   Back pain without sciatica 07/11/2021   Neck pain 07/11/2021   Encounter for well woman exam with routine gynecological exam 07/28/2021   Urinary frequency  07/28/2021   Encounter for initial prescription of contraceptive pills 07/28/2021   Encounter for surveillance of contraceptive pills 10/28/2021   Proc/trtmt not crd out d/t pt lv bef seen by [facility] 01/31/2022   Past Medical History:  Diagnosis Date   Bipolar affect, depressed (HCC)    Depression    HSV (herpes simplex  virus) infection    Insomnia     Review of Systems  Constitutional:  Negative for chills and fever.  HENT:  Positive for congestion.   Respiratory:  Negative for cough and shortness of breath.   Cardiovascular:  Negative for chest pain and leg swelling.  Gastrointestinal:  Negative for blood in stool, melena, nausea and vomiting.  Genitourinary:  Negative for frequency and urgency.  Neurological:  Negative for dizziness and headaches.  Endo/Heme/Allergies:  Negative for environmental allergies and polydipsia. Does not bruise/bleed easily.  Psychiatric/Behavioral:  The patient does not have insomnia.    Negative unless indicated in HPI    Objective:    BP 107/65   Pulse 80   Temp (!) 97.3 F (36.3 C) (Temporal)   Ht 5\' 8"  (1.727 m)   Wt 191 lb 3.2 oz (86.7 kg)   LMP 07/24/2022   SpO2 99%   BMI 29.07 kg/m  BP Readings from Last 3 Encounters:  01/03/23 107/65  11/02/22 111/72  08/29/22 124/71   Wt Readings from Last 3 Encounters:  01/03/23 191 lb 3.2 oz (86.7 kg)  11/02/22 189 lb 6.4 oz (85.9 kg)  08/29/22 198 lb (89.8 kg)      Physical Exam Vitals and nursing note reviewed.  Constitutional:      Appearance: Normal appearance.  HENT:     Nose: Congestion and rhinorrhea present.     Right Sinus: Frontal sinus tenderness present.     Left Sinus: Frontal sinus tenderness present.  Cardiovascular:     Rate and Rhythm: Normal rate and regular rhythm.  Pulmonary:     Effort: Pulmonary effort is normal.     Breath sounds: Normal breath sounds.  Skin:    General: Skin is warm and dry.     Findings: No rash.  Neurological:     Mental Status: She is alert and oriented to person, place, and time. Mental status is at baseline.  Psychiatric:        Mood and Affect: Mood normal.        Behavior: Behavior normal.        Thought Content: Thought content normal.        Judgment: Judgment normal.     No results found for any visits on 01/03/23.      Assessment & Plan:   Acute frontal sinusitis, recurrence not specified -     Doxycycline Hyclate; Take 1 tablet (100 mg total) by mouth 2 (two) times daily.  Dispense: 20 tablet; Refill: 0   Tara Colon is a 32 yrs old female seen fro sinusitis, no acute distress Doxy 100 mg 1-tab BID for 10 days ( see EMR for sign pregnancy waiver) Continue Flonase for nasal congestion Increase hydration  Encourage healthy lifestyle choices, including diet (rich in fruits, vegetables, and lean proteins, and low in salt and simple carbohydrates) and exercise (at least 30 minutes of moderate physical activity daily).     The above assessment and management plan was discussed with the patient. The patient verbalized understanding of and has agreed to the management plan. Patient is aware to call the clinic if they develop any new  symptoms or if symptoms persist or worsen. Patient is aware when to return to the clinic for a follow-up visit. Patient educated on when it is appropriate to go to the emergency department.  Return if symptoms worsen or fail to improve.  Arrie Aran Santa Lighter, DNP [facility] [address] [place], [place] [address] [phone]

## 2023-01-11 ENCOUNTER — Other Ambulatory Visit (HOSPITAL_COMMUNITY)
Admission: RE | Admit: 2023-01-11 | Discharge: 2023-01-11 | Disposition: A | Discharge status: Home / Self Care | Payer: BC Managed Care – PPO | Source: Ambulatory Visit | Attending: Adult Health | Admitting: Adult Health

## 2023-01-11 ENCOUNTER — Ambulatory Visit: Payer: BC Managed Care – PPO | Admitting: Adult Health

## 2023-01-11 ENCOUNTER — Encounter: Payer: Self-pay | Admitting: Adult Health

## 2023-01-11 VITALS — BP 114/74 | HR 79 | Ht 68.0 in | Wt 190.5 lb

## 2023-01-11 DIAGNOSIS — Z113 Encounter for screening for infections with a predominantly sexual mode of transmission: Secondary | ICD-10-CM | POA: Insufficient documentation

## 2023-01-11 DIAGNOSIS — Z01419 Encounter for gynecological examination (general) (routine) without abnormal findings: Secondary | ICD-10-CM | POA: Insufficient documentation

## 2023-01-11 DIAGNOSIS — Z3201 Encounter for pregnancy test, result positive: Secondary | ICD-10-CM

## 2023-01-11 DIAGNOSIS — R599 Enlarged lymph nodes, unspecified: Secondary | ICD-10-CM | POA: Diagnosis not present

## 2023-01-11 LAB — POCT URINE PREGNANCY: Preg Test, Ur: NEGATIVE

## 2023-01-11 NOTE — Progress Notes (Signed)
Subjective:     Patient ID: Tara Colon, female   DOB: 20-Oct-1990, 32 y.o.   MRN: [id number]  HPI Tara Colon is a 32 year old black female, single, [id number] in complaining of knot in left groin area, that was hair bump and used warm compress, but still feels knot.  She had miscarriage in June and had UPT that was + today, but she said she had period in September and October. Last sex was first part of September.  On doxycycline for sinus infection from PCP.      Component Value Date/Time   DIAGPAP  07/21/2020 0909    - Negative for intraepithelial lesion or malignancy (NILM)   HPVHIGH Negative 07/21/2020 0909   ADEQPAP  07/21/2020 0909    Satisfactory for evaluation; transformation zone component ABSENT.   PCP is L Rakes NP   Review of Systems + knot in left groin area +UPT   Reviewed past medical,surgical, social and family history. Reviewed medications and allergies.  Objective:   Physical Exam BP 114/74 (BP Location: Left Arm, Patient Position: Sitting, Cuff Size: Normal)   Pulse 79   Ht 5\' 8"  (1.727 m)   Wt 190 lb 8 oz (86.4 kg)   LMP 12/31/2022 (Approximate)   Breastfeeding No   BMI 28.97 kg/m  UPT is + Skin warm and dry.Pelvic: external genitalia is normal in appearance, has rubbery feeling lymph in left groin, vagina: white discharge without odor,urethra has no lesions or masses noted, cervix:smooth and bulbous, uterus: normal size, shape and contour, non tender, no masses felt, adnexa: no masses or tenderness noted. Bladder is non tender and no masses felt. CV swab obtained.     Upstream - 01/11/23 1451       Pregnancy Intention Screening   Does the patient want to become pregnant in the next year? No    Does the patient's partner want to become pregnant in the next year? No    Would the patient like to discuss contraceptive options today? No      Contraception Wrap Up   Current Method Abstinence    End Method Abstinence    Contraception Counseling Provided No     How was the end contraceptive method provided? N/A            Examination chaperoned by Malachy Mood LPN Assessment:     1. Pregnancy examination or test, positive result +UPT will check QHCG  Blood type is A+ Had period in September and October per pt  - POCT urine pregnancy - Beta hCG quant (ref lab)  2. Palpable lymph node Has +lymph node in left groin area  - CBC w/Diff  3. Screen for STD (sexually transmitted disease) CV swab sent for GC/CHL,trich,BV and yeast  Had non reactive HIV and RPR 11/29/22 - Cervicovaginal ancillary only( [place])     Plan:     Follow up TBD

## 2023-01-12 ENCOUNTER — Other Ambulatory Visit: Payer: Self-pay | Admitting: Adult Health

## 2023-01-12 DIAGNOSIS — Z3201 Encounter for pregnancy test, result positive: Secondary | ICD-10-CM

## 2023-01-12 LAB — CBC WITH DIFFERENTIAL/PLATELET
Basophils Absolute: 0 10*3/uL (ref 0.0–0.2)
Basos: 1 %
EOS (ABSOLUTE): 0.1 10*3/uL (ref 0.0–0.4)
Eos: 3 %
Hematocrit: 42.7 % (ref 34.0–46.6)
Hemoglobin: 14.3 g/dL (ref 11.1–15.9)
Immature Grans (Abs): 0 10*3/uL (ref 0.0–0.1)
Immature Granulocytes: 0 %
Lymphocytes Absolute: 2.2 10*3/uL (ref 0.7–3.1)
Lymphs: 44 %
MCH: 31.6 pg (ref 26.6–33.0)
MCHC: 33.5 g/dL (ref 31.5–35.7)
MCV: 95 fL (ref 79–97)
Monocytes Absolute: 0.4 10*3/uL (ref 0.1–0.9)
Monocytes: 8 %
Neutrophils Absolute: 2.2 10*3/uL (ref 1.4–7.0)
Neutrophils: 44 %
Platelets: 284 10*3/uL (ref 150–450)
RBC: 4.52 x10E6/uL (ref 3.77–5.28)
RDW: 11.9 % (ref 11.7–15.4)
WBC: 4.9 10*3/uL (ref 3.4–10.8)

## 2023-01-12 LAB — BETA HCG QUANT (REF LAB): hCG Quant: 2 m[IU]/mL

## 2023-01-15 ENCOUNTER — Telehealth: Payer: Self-pay

## 2023-01-15 LAB — CERVICOVAGINAL ANCILLARY ONLY
Bacterial Vaginitis (gardnerella): NEGATIVE
Candida Glabrata: NEGATIVE
Candida Vaginitis: NEGATIVE
Chlamydia: NEGATIVE
Comment: NEGATIVE
Comment: NEGATIVE
Comment: NEGATIVE
Comment: NEGATIVE
Comment: NEGATIVE
Comment: NORMAL
Neisseria Gonorrhea: NEGATIVE
Trichomonas: NEGATIVE

## 2023-01-15 NOTE — Telephone Encounter (Signed)
Medicaid Managed Care   Unsuccessful Outreach Note  01/15/2023 Name: Keya Jezewski MRN: [id number] DOB: 05-Feb-1991  Referred by: Sonny Masters, FNP Reason for referral : No chief complaint on file.   An unsuccessful telephone outreach was attempted today. The patient was referred to the case management team for assistance with care management and care coordination.   Follow Up Plan: If patient returns call to provider office, please advise to call Embedded Care Management Care Guide Nicholes Rough* at [phone]*  Nicholes Rough, CMA Care Guide [employer]

## 2023-01-17 ENCOUNTER — Ambulatory Visit (INDEPENDENT_AMBULATORY_CARE_PROVIDER_SITE_OTHER): Payer: BC Managed Care – PPO | Admitting: Family Medicine

## 2023-01-17 ENCOUNTER — Encounter: Payer: Self-pay | Admitting: Family Medicine

## 2023-01-17 VITALS — BP 100/59 | HR 81 | Temp 98.4°F | Ht 68.0 in | Wt 192.0 lb

## 2023-01-17 DIAGNOSIS — K2970 Gastritis, unspecified, without bleeding: Secondary | ICD-10-CM | POA: Diagnosis not present

## 2023-01-17 MED ORDER — DICYCLOMINE HCL 10 MG PO CAPS
10.0000 mg | ORAL_CAPSULE | Freq: Two times a day (BID) | ORAL | 0 refills | Status: DC
Start: 2023-01-17 — End: 2023-04-26

## 2023-01-17 MED ORDER — ONDANSETRON HCL 4 MG PO TABS: 4.0000 mg | ORAL_TABLET | Freq: Three times a day (TID) | ORAL | 0 refills | Status: DC | PRN

## 2023-01-17 NOTE — Progress Notes (Signed)
Acute Office Visit  Subjective:     Patient ID: Tara Colon, female    DOB: Sep 05, 1990, 32 y.o.   MRN: [id number]  Chief Complaint  Patient presents with   Abdominal Pain    Abdominal Pain This is a new problem. Episode onset: 2 days. The onset quality is sudden. The problem has been unchanged. The pain is located in the generalized abdominal region. The quality of the pain is cramping. The abdominal pain does not radiate. Associated symptoms include belching, flatus and nausea. Pertinent negatives include no anorexia, arthralgias, constipation, diarrhea, dysuria, fever, frequency, headaches, hematochezia, hematuria, melena, myalgias, vomiting or weight loss. Nothing aggravates the pain. The pain is relieved by Nothing. She has tried acetaminophen for the symptoms. The treatment provided no relief. There is no history of abdominal surgery, gallstones, GERD, irritable bowel syndrome, pancreatitis or ulcerative colitis.   Reports that she had similar symptoms earlier this year and was seen in the ER for this. Was dx with gastritis. She was given bentyl and zofran with good relief. No recent travel, no sick contacts.   Review of Systems  Constitutional:  Negative for fever and weight loss.  Gastrointestinal:  Positive for abdominal pain, flatus and nausea. Negative for anorexia, constipation, diarrhea, hematochezia, melena and vomiting.  Genitourinary:  Negative for dysuria, frequency and hematuria.  Musculoskeletal:  Negative for arthralgias and myalgias.  Neurological:  Negative for headaches.        Objective:    BP (!) 100/59   Pulse 81   Temp 98.4 F (36.9 C) (Temporal)   Ht 5\' 8"  (1.727 m)   Wt 192 lb (87.1 kg)   LMP 12/31/2022 (Approximate)   SpO2 99%   BMI 29.19 kg/m    Physical Exam Vitals and nursing note reviewed.  Constitutional:      General: She is not in acute distress.    Appearance: She is well-developed. She is not ill-appearing, toxic-appearing or  diaphoretic.  HENT:     Head: Normocephalic and atraumatic.     Mouth/Throat:     Mouth: Mucous membranes are moist.     Pharynx: Oropharynx is clear.  Eyes:     General: No scleral icterus.    Extraocular Movements: Extraocular movements intact.  Cardiovascular:     Rate and Rhythm: Regular rhythm.     Heart sounds: Normal heart sounds. No murmur heard. Pulmonary:     Effort: Pulmonary effort is normal. No respiratory distress.     Breath sounds: Normal breath sounds. No wheezing, rhonchi or rales.  Abdominal:     General: Bowel sounds are normal. There is no distension.     Palpations: Abdomen is soft. There is no mass.     Tenderness: There is no abdominal tenderness. There is no right CVA tenderness, left CVA tenderness, guarding or rebound. Negative signs include Murphy's sign and McBurney's sign.  Skin:    General: Skin is warm and dry.  Neurological:     General: No focal deficit present.     Mental Status: She is alert and oriented to person, place, and time.  Psychiatric:        Mood and Affect: Mood normal.        Behavior: Behavior normal.     No results found for any visits on 01/17/23.      Assessment & Plan:   Tara Colon was seen today for abdominal pain.  Diagnoses and all orders for this visit:  Viral gastritis Discussed symptomatic care and return precautions.  -  dicyclomine (BENTYL) 10 MG capsule; Take 1 capsule (10 mg total) by mouth 2 (two) times daily for 10 days. -     ondansetron (ZOFRAN) 4 MG tablet; Take 1 tablet (4 mg total) by mouth every 8 (eight) hours as needed for nausea or vomiting.  The patient indicates understanding of these issues and agrees with the plan.   Gabriel Earing, FNP

## 2023-01-26 DIAGNOSIS — J329 Chronic sinusitis, unspecified: Secondary | ICD-10-CM | POA: Diagnosis not present

## 2023-01-26 DIAGNOSIS — B9689 Other specified bacterial agents as the cause of diseases classified elsewhere: Secondary | ICD-10-CM | POA: Diagnosis not present

## 2023-01-26 DIAGNOSIS — N760 Acute vaginitis: Secondary | ICD-10-CM | POA: Diagnosis not present

## 2023-01-26 DIAGNOSIS — R519 Headache, unspecified: Secondary | ICD-10-CM | POA: Diagnosis not present

## 2023-02-05 DIAGNOSIS — H47322 Drusen of optic disc, left eye: Secondary | ICD-10-CM | POA: Diagnosis not present

## 2023-02-05 DIAGNOSIS — Z8669 Personal history of other diseases of the nervous system and sense organs: Secondary | ICD-10-CM | POA: Diagnosis not present

## 2023-02-06 ENCOUNTER — Telehealth: Payer: Self-pay | Admitting: Adult Health

## 2023-02-06 MED ORDER — ETONOGESTREL-ETHINYL ESTRADIOL 0.12-0.015 MG/24HR VA RING: VAGINAL_RING | VAGINAL | 4 refills | Status: DC

## 2023-02-06 NOTE — Telephone Encounter (Signed)
Left message will rx nuva ring, use condoms for 1 month with ring

## 2023-02-06 NOTE — Telephone Encounter (Signed)
Patient called to see if she needed to repeat her quants to get her nuvaring refilled. She stated she just had period from 11/14-11/18. Please advise.

## 2023-02-06 NOTE — Addendum Note (Signed)
Addended by: Cyril Mourning A on: 02/06/2023 12:33 PM   Modules accepted: Orders

## 2023-02-17 DIAGNOSIS — Z20822 Contact with and (suspected) exposure to covid-19: Secondary | ICD-10-CM | POA: Diagnosis not present

## 2023-02-17 DIAGNOSIS — B349 Viral infection, unspecified: Secondary | ICD-10-CM | POA: Diagnosis not present

## 2023-02-18 ENCOUNTER — Emergency Department (HOSPITAL_COMMUNITY): Payer: BC Managed Care – PPO

## 2023-02-18 ENCOUNTER — Other Ambulatory Visit: Payer: Self-pay

## 2023-02-18 ENCOUNTER — Emergency Department (HOSPITAL_COMMUNITY)
Admission: EM | Admit: 2023-02-18 | Discharge: 2023-02-18 | Disposition: A | Discharge status: Home / Self Care | Payer: BC Managed Care – PPO | Attending: Emergency Medicine | Admitting: Emergency Medicine

## 2023-02-18 DIAGNOSIS — B9789 Other viral agents as the cause of diseases classified elsewhere: Secondary | ICD-10-CM | POA: Diagnosis not present

## 2023-02-18 DIAGNOSIS — J069 Acute upper respiratory infection, unspecified: Secondary | ICD-10-CM | POA: Insufficient documentation

## 2023-02-18 DIAGNOSIS — Z20822 Contact with and (suspected) exposure to covid-19: Secondary | ICD-10-CM | POA: Insufficient documentation

## 2023-02-18 DIAGNOSIS — R059 Cough, unspecified: Secondary | ICD-10-CM | POA: Diagnosis not present

## 2023-02-18 LAB — RESP PANEL BY RT-PCR (RSV, FLU A&B, COVID)  RVPGX2
Influenza A by PCR: NEGATIVE
Influenza B by PCR: NEGATIVE
Resp Syncytial Virus by PCR: NEGATIVE
SARS Coronavirus 2 by RT PCR: NEGATIVE

## 2023-02-18 NOTE — ED Triage Notes (Signed)
Pt arrived POV c/o running nose, cough phelm, pressure in face with no fevers since Thanksgiving. Pt stated she was tested for covid at Urgent care yesterday and it was negative. Pt was told she had a virus.

## 2023-02-18 NOTE — ED Provider Notes (Signed)
[place] EMERGENCY DEPARTMENT AT [facility] Provider Note   CSN: [id number] Arrival date & time: 02/18/23  4098     History  Chief Complaint  Patient presents with   Cough    Tara Colon is a 32 y.o. female resents with concern for productive cough, and runny nose for 4 days. She reports green phlegm.  Denies any fevers, sore throat, nausea or vomiting.  She was tested for COVID at urgent care yesterday and told it was negative and that she had a virus.`   Cough      Home Medications Prior to Admission medications   Medication Sig Start Date End Date Taking? Authorizing Provider  dicyclomine (BENTYL) 10 MG capsule Take 1 capsule (10 mg total) by mouth 2 (two) times daily for 10 days. 01/17/23 01/27/23  Gabriel Earing, FNP  etonogestrel-ethinyl estradiol (NUVARING) 0.12-0.015 MG/24HR vaginal ring Insert vaginally and leave in place for 3 consecutive weeks, then remove for 1 week. 02/06/23   Adline Potter, NP  fluticasone (FLONASE) 50 MCG/ACT nasal spray Place 2 sprays into both nostrils daily. 04/12/22   Sonny Masters, FNP  hydrOXYzine (ATARAX) 10 MG tablet Take 1 tablet (10 mg total) by mouth 3 (three) times daily as needed. 05/03/22   Sonny Masters, FNP  ondansetron (ZOFRAN) 4 MG tablet Take 1 tablet (4 mg total) by mouth every 8 (eight) hours as needed for nausea or vomiting. 01/17/23   Gabriel Earing, FNP  sertraline (ZOLOFT) 50 MG tablet Take 1 tablet (50 mg total) by mouth daily. 08/02/22   Adline Potter, NP      Allergies    Amoxicillin and Bactrim [sulfamethoxazole-trimethoprim]    Review of Systems   Review of Systems  Respiratory:  Positive for cough.     Physical Exam Updated Vital Signs BP 119/74   Pulse 84   Temp 98.2 F (36.8 C) (Oral)   Resp 17   Ht 5\' 8"  (1.727 m)   Wt 89.8 kg   LMP 02/01/2023 (Approximate)   SpO2 99%   BMI 30.11 kg/m  Physical Exam Vitals and nursing note reviewed.  Constitutional:      General:  She is not in acute distress.    Appearance: She is well-developed.  HENT:     Head: Normocephalic and atraumatic.     Nose: Congestion and rhinorrhea present.     Comments: Edematous nasal turbinates    Mouth/Throat:     Comments: Posterior oropharynx without any erythema Eyes:     Conjunctiva/sclera: Conjunctivae normal.  Cardiovascular:     Rate and Rhythm: Normal rate and regular rhythm.     Heart sounds: No murmur heard. Pulmonary:     Effort: Pulmonary effort is normal. No respiratory distress.     Breath sounds: Normal breath sounds.  Abdominal:     Palpations: Abdomen is soft.     Tenderness: There is no abdominal tenderness.  Musculoskeletal:        General: No swelling.     Cervical back: Neck supple.  Skin:    General: Skin is warm and dry.     Capillary Refill: Capillary refill takes less than 2 seconds.  Neurological:     Mental Status: She is alert.  Psychiatric:        Mood and Affect: Mood normal.     ED Results / Procedures / Treatments   Labs (all labs ordered are listed, but only abnormal results are displayed) Labs Reviewed  RESP  PANEL BY RT-PCR (RSV, FLU A&B, COVID)  [id number]    EKG None  Radiology DG Chest 2 View  Result Date: 02/18/2023 CLINICAL DATA:  Cough EXAM: CHEST - 2 VIEW COMPARISON:  05/18/2022 FINDINGS: The heart size and mediastinal contours are within normal limits. Both lungs are clear. The visualized skeletal structures are unremarkable. IMPRESSION: No active cardiopulmonary disease. Electronically Signed   By: Duanne Guess D.O.   On: 02/18/2023 10:20    Procedures Procedures    Medications Ordered in ED Medications - No data to display  ED Course/ Medical Decision Making/ A&P                                 Medical Decision Making Amount and/or Complexity of Data Reviewed Radiology: ordered.     Differential diagnosis includes but is not limited to COVID, flu, RSV, viral URI, pneumonia, strep pharyngitis, viral  pharyngitis  ED Course:  Patient overall well-appearing, vital signs stable. Lungs clear to auscultation. She does have nasal congestion on exam. COVID, flu, RSV is negative.  Chest x-ray without any acute abnormalities. Low suspicion for pneumonia at this time. Suspect viral URI.  Patient appropriate for discharge home at this time.   Impression: Viral URI  Disposition:  The patient was discharged home with instructions to continue symptom control with current medications.  Follow-up with PCP if not improved within the next week. Return precautions given.  Lab Tests: I Ordered, and personally interpreted labs.  The pertinent results include:   Flu, covid, RSV negative  Imaging Studies ordered: I ordered imaging studies including chest x-ray I independently visualized the imaging with scope of interpretation limited to determining acute life threatening conditions related to emergency care. Imaging showed no consolidations, no pleural effusions I agree with the radiologist interpretation   External records from outside source obtained and reviewed including urgent care note from yesterday where she was tested for COVID, flu, RSV which were all negative.              Final Clinical Impression(s) / ED Diagnoses Final diagnoses:  Viral URI with cough    Rx / DC Orders ED Discharge Orders     None         Arabella Merles, PA-C 02/18/23 1054    Rondel Baton, MD 02/22/23 [phone]

## 2023-02-18 NOTE — Discharge Instructions (Signed)
You appear to have an upper respiratory infection (URI). An upper respiratory tract infection, or cold, is a viral infection of the air passages leading to the lungs. It should improve gradually after 5-7 days. You may have a lingering cough that lasts for 2- 4 weeks after the infection.  Your flu, covid, and RSV test were negative today   Your chest x-ray did not show any signs of pneumonia.  Home care instructions:  You can take Tylenol and/or Ibuprofen as directed on the packaging for fever reduction and pain relief.    For cough: honey 1/2 to 1 teaspoon (you can dilute the honey in water or another fluid).  You can also use guaifenesin and dextromethorphan for cough which are over-the-counter medications. You can use a humidifier for chest congestion and cough.  If you don't have a humidifier, you can sit in the bathroom with the hot shower running.      For sore throat: try warm salt water gargles, cepacol lozenges, throat spray, warm tea or water with lemon/honey, popsicles or ice, or OTC cold relief medicine for throat discomfort.    For congestion: Flonase 1-2 sprays in each nostril daily.    It is important to stay hydrated: drink plenty of fluids (water, gatorade/powerade/pedialyte, juices, or teas) to keep your throat moisturized and help further relieve irritation/discomfort.   Your illness is contagious and can be spread to others, especially during the first 3 or 4 days. It cannot be cured by antibiotics or other medicines. Take basic precautions such as washing your hands often, covering your mouth when you cough or sneeze, and avoiding public places where you could spread your illness to others.   Please continue drinking plenty of fluids.  Use over-the-counter medicines as needed as directed on packaging for symptom relief.  You may also use ibuprofen or tylenol as directed on packaging for pain or fever.  Do not take multiple medicines containing Tylenol or acetaminophen to avoid  taking too much of this medication.  Follow-up instructions: Please follow-up with your primary care provider in the next week for further evaluation of your symptoms if you are not feeling better.   Return instructions:  Please return to the Emergency Department if you experience worsening symptoms.  RETURN IMMEDIATELY IF you develop shortness of breath, confusion or altered mental status, a new rash, become dizzy, faint, or poorly responsive, or are unable to be cared for at home. Please return if you have persistent vomiting and cannot keep down fluids or develop a fever that is not controlled by tylenol or motrin.   Please return if you have any other emergent concerns.

## 2023-03-07 DIAGNOSIS — H31092 Other chorioretinal scars, left eye: Secondary | ICD-10-CM | POA: Diagnosis not present

## 2023-03-07 DIAGNOSIS — H47323 Drusen of optic disc, bilateral: Secondary | ICD-10-CM | POA: Diagnosis not present

## 2023-03-07 DIAGNOSIS — H35412 Lattice degeneration of retina, left eye: Secondary | ICD-10-CM | POA: Diagnosis not present

## 2023-03-07 DIAGNOSIS — H43823 Vitreomacular adhesion, bilateral: Secondary | ICD-10-CM | POA: Diagnosis not present

## 2023-03-29 DIAGNOSIS — R1110 Vomiting, unspecified: Secondary | ICD-10-CM | POA: Diagnosis not present

## 2023-03-29 DIAGNOSIS — R197 Diarrhea, unspecified: Secondary | ICD-10-CM | POA: Diagnosis not present

## 2023-03-29 DIAGNOSIS — R109 Unspecified abdominal pain: Secondary | ICD-10-CM | POA: Diagnosis not present

## 2023-04-03 ENCOUNTER — Emergency Department (HOSPITAL_COMMUNITY)
Admission: EM | Admit: 2023-04-03 | Discharge: 2023-04-03 | Discharge status: Against Medical Advice | Payer: BC Managed Care – PPO | Attending: Emergency Medicine | Admitting: Emergency Medicine

## 2023-04-03 ENCOUNTER — Emergency Department (HOSPITAL_COMMUNITY)
Admission: EM | Admit: 2023-04-03 | Discharge: 2023-04-03 | Disposition: A | Discharge status: Home / Self Care | Payer: BC Managed Care – PPO | Attending: Emergency Medicine | Admitting: Emergency Medicine

## 2023-04-03 ENCOUNTER — Other Ambulatory Visit: Payer: Self-pay

## 2023-04-03 ENCOUNTER — Encounter (HOSPITAL_COMMUNITY): Payer: Self-pay

## 2023-04-03 DIAGNOSIS — E860 Dehydration: Secondary | ICD-10-CM | POA: Diagnosis not present

## 2023-04-03 DIAGNOSIS — R1030 Lower abdominal pain, unspecified: Secondary | ICD-10-CM | POA: Insufficient documentation

## 2023-04-03 DIAGNOSIS — R531 Weakness: Secondary | ICD-10-CM | POA: Insufficient documentation

## 2023-04-03 DIAGNOSIS — Z5321 Procedure and treatment not carried out due to patient leaving prior to being seen by health care provider: Secondary | ICD-10-CM | POA: Insufficient documentation

## 2023-04-03 LAB — URINALYSIS, ROUTINE W REFLEX MICROSCOPIC
Bilirubin Urine: NEGATIVE
Glucose, UA: NEGATIVE mg/dL
Ketones, ur: NEGATIVE mg/dL
Leukocytes,Ua: NEGATIVE
Nitrite: NEGATIVE
Protein, ur: 30 mg/dL — AB
Specific Gravity, Urine: 1.034 — ABNORMAL HIGH (ref 1.005–1.030)
pH: 5 (ref 5.0–8.0)

## 2023-04-03 LAB — BASIC METABOLIC PANEL
Anion gap: 5 (ref 5–15)
BUN: 15 mg/dL (ref 6–20)
CO2: 25 mmol/L (ref 22–32)
Calcium: 8.5 mg/dL — ABNORMAL LOW (ref 8.9–10.3)
Chloride: 104 mmol/L (ref 98–111)
Creatinine, Ser: 0.67 mg/dL (ref 0.44–1.00)
GFR, Estimated: >60 mL/min (ref >60)
Glucose, Bld: 92 mg/dL (ref 70–99)
Potassium: 3.6 mmol/L (ref 3.5–5.1)
Sodium: 134 mmol/L — ABNORMAL LOW (ref 135–145)

## 2023-04-03 LAB — CBG MONITORING, ED: Glucose-Capillary: 92 mg/dL (ref 70–99)

## 2023-04-03 MED ORDER — SODIUM CHLORIDE 0.9 % IV BOLUS
1000.0000 mL | Freq: Once | INTRAVENOUS | Status: AC
  Administered 2023-04-03: 1000 mL via INTRAVENOUS

## 2023-04-03 NOTE — ED Provider Notes (Signed)
 [place] EMERGENCY DEPARTMENT AT [facility] Provider Note   CSN: [id number] Arrival date & time: 04/03/23  1601     History  Chief Complaint  Patient presents with   dehydrated    Tara Colon is a 33 y.o. female presenting for evaluation and treatment of dehydration status post gastroenteritis which she suspects may have been due to norovirus.  She was seen at an outside emergency department 5 days ago during which time she received IV fluids, labs and CT imaging because she also had some lower abdominal pain, all these test were negative, she was sent home with Zofran  and she reports her nausea is under better control and her diarrhea has resolved but she continues to feel very weak and dehydrated.  She is not a diabetic.  She is concerned because she has been having increased thirst and therefore has been increased her fluid intake but has not had an increase in her urine output.  She states she has urinated about 5 times today, small amounts and darker than normal.  She denies dysuria, and as mentioned diarrhea has resolved.  She is concerned that nobody has tested her for norovirus.  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  She continues to take Zofran  as needed for nausea.  The history is provided by the patient.       Home Medications Prior to Admission medications   Medication Sig Start Date End Date Taking? Authorizing Provider  dicyclomine  (BENTYL ) 10 MG capsule Take 1 capsule (10 mg total) by mouth 2 (two) times daily for 10 days. 01/17/23 01/27/23  Joesph Annabella HERO, FNP  etonogestrel -ethinyl estradiol  (NUVARING) 0.12-0.015 MG/24HR vaginal ring Insert vaginally and leave in place for 3 consecutive weeks, then remove for 1 week. 02/06/23   Griffin, Jennifer A, NP  fluticasone  (FLONASE ) 50 MCG/ACT nasal spray Place 2 sprays into both nostrils daily. 04/12/22   Severa Rock HERO, FNP   hydrOXYzine  (ATARAX ) 10 MG tablet Take 1 tablet (10 mg total) by mouth 3 (three) times daily as needed. 05/03/22   Severa Rock HERO, FNP  ondansetron  (ZOFRAN ) 4 MG tablet Take 1 tablet (4 mg total) by mouth every 8 (eight) hours as needed for nausea or vomiting. 01/17/23   Joesph Annabella HERO, FNP  sertraline  (ZOLOFT ) 50 MG tablet Take 1 tablet (50 mg total) by mouth daily. 08/02/22   Signa Delon LABOR, NP      Allergies    Amoxicillin  and Bactrim  [sulfamethoxazole -trimethoprim ]    Review of Systems   Review of Systems  Constitutional:  Positive for fatigue. Negative for fever.  HENT:  Negative for congestion and sore throat.   Eyes: Negative.   Respiratory:  Negative for chest tightness and shortness of breath.   Cardiovascular:  Negative for chest pain.  Gastrointestinal:  Positive for diarrhea, nausea and vomiting. Negative for abdominal pain.  Genitourinary:  Positive for decreased urine volume. Negative for dysuria.  Musculoskeletal:  Negative for arthralgias, joint swelling and neck pain.  Skin: Negative.  Negative for rash and wound.  Neurological:  Positive for weakness. Negative for dizziness, light-headedness, numbness and headaches.  Psychiatric/Behavioral: Negative.      Physical Exam Updated Vital Signs BP 114/72 (BP Location: Right Arm)   Pulse 78   Temp 97.9 F (36.6 C) (Oral)   Resp 15   Ht 5' 8 (1.727 m)   Wt 90 kg   SpO2 99%  BMI 30.17 kg/m  Physical Exam Vitals and nursing note reviewed.  Constitutional:      Appearance: She is well-developed.  HENT:     Head: Normocephalic and atraumatic.     Mouth/Throat:     Mouth: Mucous membranes are moist.     Pharynx: Oropharynx is clear.  Eyes:     Conjunctiva/sclera: Conjunctivae normal.  Cardiovascular:     Rate and Rhythm: Normal rate and regular rhythm.     Heart sounds: Normal heart sounds.  Pulmonary:     Effort: Pulmonary effort is normal.     Breath sounds: Normal breath sounds. No wheezing.   Abdominal:     General: Bowel sounds are normal.     Palpations: Abdomen is soft.     Tenderness: There is no abdominal tenderness. There is no guarding.  Musculoskeletal:        General: Normal range of motion.     Cervical back: Normal range of motion.  Skin:    General: Skin is warm and dry.  Neurological:     General: No focal deficit present.     Mental Status: She is alert.     ED Results / Procedures / Treatments   Labs (all labs ordered are listed, but only abnormal results are displayed) Labs Reviewed  URINALYSIS, ROUTINE W REFLEX MICROSCOPIC - Abnormal; Notable for the following components:      Result Value   Specific Gravity, Urine 1.034 (*)    Hgb urine dipstick SMALL (*)    Protein, ur 30 (*)    Bacteria, UA RARE (*)    All other components within normal limits  BASIC METABOLIC PANEL - Abnormal; Notable for the following components:   Sodium 134 (*)    Calcium 8.5 (*)    All other components within normal limits  CBG MONITORING, ED    EKG None  Radiology No results found.  Procedures Procedures    Medications Ordered in ED Medications  sodium chloride  0.9 % bolus 1,000 mL (0 mLs Intravenous Stopped 04/03/23 2155)    ED Course/ Medical Decision Making/ A&P                                 Medical Decision Making Patient presenting with perceived dehydration given reduced urinary volume and darker than normal urine status post what sounds like a viral gastroenteritis which is now improving.  Since the diarrhea has resolved I did not feel it would be helpful to screen for norovirus at this time.  She was given IV fluids while we awaited basic blood work including electrolytes and kidney function.  Her urinalysis was reviewed and is borderline concentrated with a specific gravity of 1.034, no infection noted.  Her labs have also resulted and are reassuring as she has a normal BUN and creatinine, her electrolytes are normal except for borderline sodium at  134.  She was advised to continue resting, increase fluid intake, Zofran  as needed, parent follow-up with her primary provider if her symptoms are not improving with this treatment plan.  No acute emergent findings identified.  Amount and/or Complexity of Data Reviewed Labs: ordered.           Final Clinical Impression(s) / ED Diagnoses Final diagnoses:  Weakness    Rx / DC Orders ED Discharge Orders     None         Tobby Fawcett, PA-C 04/04/23 1535  Freddi Hamilton, MD 04/06/23 [phone]

## 2023-04-03 NOTE — ED Triage Notes (Signed)
 Pt arrived via POV c/o possible dehydration. Pt reports being evaluated recently for N/V and was given zofran which seemed to help. Pt reports her urine has been "more dark". Pt endorses dull abdominal pain/cramping.

## 2023-04-03 NOTE — Discharge Instructions (Signed)
 Based on your lab tests tonight including normal electrolytes and kidney function,  you are not overly dehydrated.  I do suspect you have fatigue from your recent illness, but this should improve with rest and time.  I do encourage oral rehydration and healthy food choices as you recover from your GI virus.

## 2023-04-03 NOTE — ED Notes (Signed)
 Pt lying in bed stated she has been throwing up for the past several days and had episodes of diarrhea. Denied seeing any blood in stools. Pt is A&O x4 and ambulatory.

## 2023-04-06 ENCOUNTER — Encounter: Payer: Self-pay | Admitting: Family Medicine

## 2023-04-06 ENCOUNTER — Ambulatory Visit (INDEPENDENT_AMBULATORY_CARE_PROVIDER_SITE_OTHER): Payer: BC Managed Care – PPO | Admitting: Family Medicine

## 2023-04-06 VITALS — BP 126/73 | HR 60 | Temp 97.4°F | Ht 68.0 in | Wt 202.0 lb

## 2023-04-06 DIAGNOSIS — L819 Disorder of pigmentation, unspecified: Secondary | ICD-10-CM | POA: Diagnosis not present

## 2023-04-06 NOTE — Progress Notes (Signed)
Subjective:  Patient ID: Tara Colon, female    DOB: Sep 11, 1990, 33 y.o.   MRN: [id number]  Patient Care Team: Sonny Masters, FNP as PCP - General (Family Medicine) Moss Mc, RN as Registered Nurse Annamarie Dawley, LPN as Licensed Practical Nurse Colen Darling, LPN   Chief Complaint:  brown spots  (Patient states over the last few years she has been getting brown spots on bottom of bilateral feet )   HPI: Tara Colon is a 33 y.o. female presenting on 04/06/2023 for brown spots  (Patient states over the last few years she has been getting brown spots on bottom of bilateral feet )   Discussed the use of AI scribe software for clinical note transcription with the patient, who gave verbal consent to proceed.  History of Present Illness   The patient has been noticing the development of brown spots on their feet over the years. Recently, about a month ago, they noticed some new spots. The spots are not associated with any pain or discomfort. The patient initially thought the spots were due to stepping on something and healing, but they have continued to appear. The patient has seen a dermatologist in the past, but that was for hair-related issues and not for these spots. There have been no other changes in hair or skin, and no abnormal moles. The patient denies any family history of skin conditions. The patient also denies any burning or tingling sensation in the feet. The brown discolorations are solely on the bottom of the feet.          Relevant past medical, surgical, family, and social history reviewed and updated as indicated.  Allergies and medications reviewed and updated. Data reviewed: Chart in Epic.   Past Medical History:  Diagnosis Date   Anxiety    Bipolar affect, depressed (HCC)    BV (bacterial vaginosis)    Depression    HSV (herpes simplex virus) infection    pt reports no outbreaks   Insomnia     Past Surgical History:  Procedure  Laterality Date   NO PAST SURGERIES      Social History   Socioeconomic History   Marital status: Single    Spouse name: Not on file   Number of children: Not on file   Years of education: Not on file   Highest education level: Not on file  Occupational History   Not on file  Tobacco Use   Smoking status: Every Day    Current packs/day: 1.00    Average packs/day: 1 pack/day for 12.0 years (12.0 ttl pk-yrs)    Types: Cigarettes   Smokeless tobacco: Never  Vaping Use   Vaping status: Former  Substance and Sexual Activity   Alcohol use: Yes    Alcohol/week: 14.0 standard drinks of alcohol    Types: 14 Cans of beer per week    Comment: occ   Drug use: Not Currently    Types: Marijuana   Sexual activity: Not Currently    Birth control/protection: None  Other Topics Concern   Not on file  Social History Narrative   Not on file   Social Drivers of Health   Financial Resource Strain: Low Risk  (08/02/2022)   Overall Financial Resource Strain (CARDIA)    Difficulty of Paying Living Expenses: Not hard at all  Food Insecurity: Food Insecurity Present (08/02/2022)   Hunger Vital Sign    Worried About Running Out of Food in the Last  Year: Never true    Ran Out of Food in the Last Year: Sometimes true  Transportation Needs: No Transportation Needs (08/02/2022)   PRAPARE - Administrator, Civil Service (Medical): No    Lack of Transportation (Non-Medical): No  Physical Activity: Sufficiently Active (08/02/2022)   Exercise Vital Sign    Days of Exercise per Week: 7 days    Minutes of Exercise per Session: 100 min  Stress: Stress Concern Present (08/02/2022)   [employer] of Occupational Health - Occupational Stress Questionnaire    Feeling of Stress : Rather much  Social Connections: Socially Isolated (08/02/2022)   Social Connection and Isolation Panel [NHANES]    Frequency of Communication with Friends and Family: More than three times a week    Frequency of  Social Gatherings with Friends and Family: More than three times a week    Attends Religious Services: Never    Database administrator or Organizations: No    Attends Banker Meetings: Never    Marital Status: Never married  Intimate Partner Violence: Not At Risk (03/29/2023)   Received from [facility]   HITS    Over the last 12 months how often did your partner physically hurt you?: Never    Over the last 12 months how often did your partner insult you or talk down to you?: Never    Over the last 12 months how often did your partner threaten you with physical harm?: Never    Over the last 12 months how often did your partner scream or curse at you?: Never    Outpatient Encounter Medications as of 04/06/2023  Medication Sig   etonogestrel-ethinyl estradiol (NUVARING) 0.12-0.015 MG/24HR vaginal ring Insert vaginally and leave in place for 3 consecutive weeks, then remove for 1 week.   fluticasone (FLONASE) 50 MCG/ACT nasal spray Place 2 sprays into both nostrils daily.   hydrOXYzine (ATARAX) 10 MG tablet Take 1 tablet (10 mg total) by mouth 3 (three) times daily as needed.   ondansetron (ZOFRAN) 4 MG tablet Take 1 tablet (4 mg total) by mouth every 8 (eight) hours as needed for nausea or vomiting.   sertraline (ZOLOFT) 50 MG tablet Take 1 tablet (50 mg total) by mouth daily.   dicyclomine (BENTYL) 10 MG capsule Take 1 capsule (10 mg total) by mouth 2 (two) times daily for 10 days.   No facility-administered encounter medications on file as of 04/06/2023.    Allergies  Allergen Reactions   Amoxicillin Rash    "feel bad"   Sulfamethoxazole-Trimethoprim Rash    "feels bad"  "feels bad"     "feels bad"    Pertinent ROS per HPI, otherwise unremarkable      Objective:  BP 126/73   Pulse 60   Temp (!) 97.4 F (36.3 C)   Ht 5\' 8"  (1.727 m)   Wt 202 lb (91.6 kg)   SpO2 99%   BMI 30.71 kg/m    Wt Readings from Last 3 Encounters:  04/06/23 202 lb (91.6 kg)   04/03/23 198 lb 6.6 oz (90 kg)  02/18/23 198 lb (89.8 kg)    Physical Exam Vitals and nursing note reviewed.  Constitutional:      Appearance: Normal appearance.  HENT:     Head: Normocephalic and atraumatic.     Nose: Nose normal.     Mouth/Throat:     Mouth: Mucous membranes are moist.  Eyes:     Conjunctiva/sclera: Conjunctivae normal.  Pupils: Pupils are equal, round, and reactive to light.  Cardiovascular:     Rate and Rhythm: Normal rate.  Pulmonary:     Effort: Pulmonary effort is normal.  Musculoskeletal:     Cervical back: Neck supple.     Right lower leg: No edema.     Left lower leg: No edema.  Feet:     Right foot:     Skin integrity: Skin integrity normal.     Left foot:     Skin integrity: Skin integrity normal.     Comments: Brown discoloration to bilateral soles of feet Skin:    General: Skin is warm and dry.     Capillary Refill: Capillary refill takes less than 2 seconds.  Neurological:     General: No focal deficit present.     Mental Status: She is alert and oriented to person, place, and time.  Psychiatric:        Mood and Affect: Mood normal.        Behavior: Behavior normal.        Thought Content: Thought content normal.        Judgment: Judgment normal.      Results for orders placed or performed during the hospital encounter of 04/03/23  CBG monitoring, ED   Collection Time: 04/03/23  4:47 PM  Result Value Ref Range   Glucose-Capillary 92 70 - 99 mg/dL  Urinalysis, Routine w reflex microscopic -Urine, Clean Catch   Collection Time: 04/03/23  5:01 PM  Result Value Ref Range   Color, Urine YELLOW YELLOW   APPearance CLEAR CLEAR   Specific Gravity, Urine 1.034 (H) 1.005 - 1.030   pH 5.0 5.0 - 8.0   Glucose, UA NEGATIVE NEGATIVE mg/dL   Hgb urine dipstick SMALL (A) NEGATIVE   Bilirubin Urine NEGATIVE NEGATIVE   Ketones, ur NEGATIVE NEGATIVE mg/dL   Protein, ur 30 (A) NEGATIVE mg/dL   Nitrite NEGATIVE NEGATIVE   Leukocytes,Ua  NEGATIVE NEGATIVE   RBC / HPF 0-5 0 - 5 RBC/hpf   WBC, UA 0-5 0 - 5 WBC/hpf   Bacteria, UA RARE (A) NONE SEEN   Squamous Epithelial / HPF 0-5 0 - 5 /HPF   Mucus PRESENT   Basic metabolic panel   Collection Time: 04/03/23  8:46 PM  Result Value Ref Range   Sodium 134 (L) 135 - 145 mmol/L   Potassium 3.6 3.5 - 5.1 mmol/L   Chloride 104 98 - 111 mmol/L   CO2 25 22 - 32 mmol/L   Glucose, Bld 92 70 - 99 mg/dL   BUN 15 6 - 20 mg/dL   Creatinine, Ser 3.24 0.44 - 1.00 mg/dL   Calcium 8.5 (L) 8.9 - 10.3 mg/dL   GFR, Estimated >40 >10 mL/min   Anion gap 5 5 - 15       Pertinent labs & imaging results that were available during my care of the patient were reviewed by me and considered in my medical decision making.  Assessment & Plan:  Shakeelah was seen today for brown spots .  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 No associated pain, burning, or tingling. No other skin or hair changes reported. No family history of skin conditions. Differential diagnosis includes  post-inflammatory hyperpigmentation, dermatophyte infection, or other dermatological conditions. Referral to dermatology discussed, noting that dermatology appointments may take time to schedule. - Refer to dermatology for further evaluation and management - Advise follow-up if no contact from dermatology within a week.        Continue all other maintenance medications.  Follow up plan: Return if symptoms worsen or fail to improve.   Continue healthy lifestyle choices, including diet (rich in fruits, vegetables, and lean proteins, and low in salt and simple carbohydrates) and exercise (at least 30 minutes of moderate physical activity daily).   The above assessment and management plan was discussed  with the patient. The patient verbalized understanding of and has agreed to the management plan. Patient is aware to call the clinic if they develop any new symptoms or if symptoms persist or worsen. Patient is aware when to return to the clinic for a follow-up visit. Patient educated on when it is appropriate to go to the emergency department.   Kari Baars, FNP-C [facility] [phone]

## 2023-04-17 ENCOUNTER — Ambulatory Visit: Payer: Self-pay | Admitting: Family Medicine

## 2023-04-17 NOTE — Telephone Encounter (Signed)
Chief Complaint: panic attack  Symptoms: rage, panicky, anxious, overwhelmed Frequency: comes and goes  Disposition: [x] ED /[] Urgent Care (no appt availability in office) / [] Appointment(In office/virtual)/ []  [facility]/ [] Home Care/ [] Refused Recommended Disposition /[] [place] [place] [facility]/ []  Follow-up with PCP Additional Notes: Pt complaining of panic attack. Pt just left work due to triggers of "stuff not being addressed. I'm overwhelmed with work and personal life." Pt states she has a hard time catching her breath and has some chest pains. "I feel like my blood pressure is high." Pt is not having any suicidal thoughts.  Pt use  to take Zoloft but stopped in 09/2022 due to side effects. Pt use to see a therapist but felt it was ineffective. Pt stated, 'I could say the craziest things and they would agree with me. That isn't right. They would give me medications where all I did was sleep. That's not for me." Per protocol, pt to go to ED. RN expalined importance since pt complained of SOB and chest pain. Pt agreed and understood advice.       Copied from CRM [id number]. Topic: Clinical - Red Word Triage >> Apr 17, 2023 11:28 AM Dondra Prader A wrote: Red Word that prompted transfer to Nurse Triage: Patient is having anxiety and mental problems, she is stressed out and states need to be back on her medication. Reason for Disposition  [1] Difficulty breathing AND [2] persists > 10 minutes AND [3] not relieved by reassurance provided by triager  Answer Assessment - Initial Assessment Questions 1. CONCERN: "Did anything happen that prompted you to call today?"      People provoking me- work and personal 2. ANXIETY SYMPTOMS: "Can you describe how you (your loved one; patient) have been feeling?" (e.g., tense, restless, panicky, anxious, keyed up, overwhelmed, sense of impending doom).      Overwhelmed, panicky, anxious 3. ONSET: "How long have you been feeling this way?" (e.g.,  hours, days, weeks)     November 2024 4. SEVERITY: "How would you rate the level of anxiety?" (e.g., 0 - 10; or mild, moderate, severe).     10 5. FUNCTIONAL IMPAIRMENT: "How have these feelings affected your ability to do daily activities?" "Have you had more difficulty than usual doing your normal daily activities?" (e.g., getting better, same, worse; self-care, school, work, interactions)     Yes- can't work-can't focus important task 6. HISTORY: "Have you felt this way before?" "Have you ever been diagnosed with an anxiety problem in the past?" (e.g., generalized anxiety disorder, panic attacks, PTSD). If Yes, ask: "How was this problem treated?" (e.g., medicines, counseling, etc.)     Yes, zoloft- stopped taking 09/2022 7. RISK OF HARM - SUICIDAL IDEATION: "Do you ever have thoughts of hurting or killing yourself?" If Yes, ask:  "Do you have these feelings now?" "Do you have a plan on how you would do this?"     denies 8. TREATMENT:  "What has been done so far to treat this anxiety?" (e.g., medicines, relaxation strategies). "What has helped?"     Tried walking away from triggers 9. TREATMENT - THERAPIST: "Do you have a counselor or therapist? Name?"     Yes, but stopped because not helping. 10. POTENTIAL TRIGGERS: "Do you drink caffeinated beverages (e.g., coffee, colas, teas), and how much daily?" "Do you drink alcohol or use any drugs?" "Have you started any new medicines recently?"       Only people  11. PATIENT SUPPORT: "Who is with you  now?" "Who do you live with?" "Do you have family or friends who you can talk to?"        Live alone  12. OTHER SYMPTOMS: "Do you have any other symptoms?" (e.g., feeling depressed, trouble concentrating, trouble sleeping, trouble breathing, palpitations or fast heartbeat, chest pain, sweating, nausea, or diarrhea)       Trouble focusing," when so upset I can't breathe" 13. PREGNANCY: "Is there any chance you are pregnant?" "When was your last menstrual  period?"       denies  Protocols used: Anxiety and Panic Attack-A-AH

## 2023-04-26 ENCOUNTER — Other Ambulatory Visit (HOSPITAL_COMMUNITY)
Admission: RE | Admit: 2023-04-26 | Discharge: 2023-04-26 | Disposition: A | Discharge status: Home / Self Care | Payer: BC Managed Care – PPO | Source: Ambulatory Visit | Attending: Obstetrics & Gynecology | Admitting: Obstetrics & Gynecology

## 2023-04-26 ENCOUNTER — Other Ambulatory Visit (INDEPENDENT_AMBULATORY_CARE_PROVIDER_SITE_OTHER): Payer: BC Managed Care – PPO | Admitting: *Deleted

## 2023-04-26 DIAGNOSIS — Z113 Encounter for screening for infections with a predominantly sexual mode of transmission: Secondary | ICD-10-CM | POA: Diagnosis not present

## 2023-04-26 DIAGNOSIS — N898 Other specified noninflammatory disorders of vagina: Secondary | ICD-10-CM | POA: Diagnosis not present

## 2023-04-26 NOTE — Progress Notes (Signed)
   NURSE VISIT- VAGINITIS/STD  SUBJECTIVE:  Tara Colon is a 33 y.o. [id number] GYN patientfemale here for a vaginal swab for vaginitis screening, STD screen.  She reports the following symptoms:  vaginal discharge  for 1 week. Denies abnormal vaginal bleeding, significant pelvic pain, fever, or UTI symptoms.  OBJECTIVE:  There were no vitals taken for this visit.  Appears well, in no apparent distress  ASSESSMENT: Vaginal swab for vaginitis screening & STD screening. HIV & RPR labs done!!  PLAN: Self-collected vaginal probe for Gonorrhea, Chlamydia, Trichomonas, Bacterial Vaginosis, Yeast sent to lab. HIV & RPR labs done. Treatment: to be determined once results are received Follow-up as needed if symptoms persist/worsen, or new symptoms develop  Tara Colon  04/26/2023 4:29 PM

## 2023-04-27 LAB — HIV ANTIBODY (ROUTINE TESTING W REFLEX): HIV Screen 4th Generation wRfx: NONREACTIVE

## 2023-04-27 LAB — RPR: RPR Ser Ql: NONREACTIVE

## 2023-04-30 LAB — CERVICOVAGINAL ANCILLARY ONLY
Bacterial Vaginitis (gardnerella): NEGATIVE
Candida Glabrata: NEGATIVE
Candida Vaginitis: POSITIVE — AB
Chlamydia: NEGATIVE
Comment: NEGATIVE
Comment: NEGATIVE
Comment: NEGATIVE
Comment: NEGATIVE
Comment: NEGATIVE
Comment: NORMAL
Neisseria Gonorrhea: NEGATIVE
Trichomonas: NEGATIVE

## 2023-05-01 ENCOUNTER — Other Ambulatory Visit: Payer: Self-pay | Admitting: Adult Health

## 2023-05-01 MED ORDER — FLUCONAZOLE 150 MG PO TABS: ORAL_TABLET | ORAL | 1 refills | Status: DC

## 2023-05-01 NOTE — Progress Notes (Signed)
+  yeast on vaginal swab, rx sent in for diflucan

## 2023-05-23 ENCOUNTER — Encounter: Payer: Self-pay | Admitting: Obstetrics & Gynecology

## 2023-05-23 ENCOUNTER — Ambulatory Visit: Admitting: Obstetrics & Gynecology

## 2023-05-23 ENCOUNTER — Other Ambulatory Visit (HOSPITAL_COMMUNITY)
Admission: RE | Admit: 2023-05-23 | Discharge: 2023-05-23 | Disposition: A | Discharge status: Home / Self Care | Source: Ambulatory Visit | Attending: Obstetrics & Gynecology | Admitting: Obstetrics & Gynecology

## 2023-05-23 VITALS — BP 112/70 | HR 80 | Ht 68.0 in | Wt 204.6 lb

## 2023-05-23 DIAGNOSIS — N761 Subacute and chronic vaginitis: Secondary | ICD-10-CM

## 2023-05-23 DIAGNOSIS — R300 Dysuria: Secondary | ICD-10-CM | POA: Diagnosis not present

## 2023-05-23 LAB — POCT URINALYSIS DIPSTICK OB
Glucose, UA: NEGATIVE
Ketones, UA: NEGATIVE
Leukocytes, UA: NEGATIVE
Nitrite, UA: NEGATIVE
POC,PROTEIN,UA: NEGATIVE

## 2023-05-23 NOTE — Progress Notes (Signed)
   GYN VISIT Patient name: Tara Colon MRN [id number]  Date of birth: 05-07-1990 Chief Complaint:   No chief complaint on file.  History of Present Illness:   Tara Colon is a 33 y.o. [phone] female being seen today for the following concerns:  States she is "feeling off."  Recently treated for yeast with Diflucan- 05/01/2023.  Still noting a discharge for the past month.  Also noting some mild cramping and occasional burning with urination and frequency. No fever or chills.  Recently has noted some light spotting.  Using Nuvaring continuous for contraception   Not currently sexually active  Denies GI symptoms  Nuvaring in place- typically skipping periods. She has been using this since November.    Review of Systems:   Pertinent items are noted in HPI Denies fever/chills, dizziness, headaches, visual disturbances, fatigue, shortness of breath, chest pain, vomiting Pertinent History Reviewed:   Past Surgical History:  Procedure Laterality Date   NO PAST SURGERIES      Past Medical History:  Diagnosis Date   Anxiety    Bipolar affect, depressed (HCC)    BV (bacterial vaginosis)    Depression    HSV (herpes simplex virus) infection    pt reports no outbreaks   Insomnia    Reviewed problem list, medications and allergies. Physical Assessment:   Vitals:   05/23/23 1356  BP: 112/70  Pulse: 80  Weight: 204 lb 9.6 oz (92.8 kg)  Height: 5\' 8"  (1.727 m)  Body mass index is 31.11 kg/m.       Physical Examination:   General appearance: alert, well appearing, and in no distress  Psych: mood appropriate, normal affect  Skin: warm & dry   Cardiovascular: normal heart rate noted  Respiratory: normal respiratory effort, no distress  Abdomen: soft, non-tender, no rebound, no guarding  Pelvic: VULVA: normal appearing vulva with no masses, tenderness or lesions, VAGINA: normal appearing vagina- minimal white discharge noted.  Nuvaring in place, CERVIX: normal appearing cervix  without discharge or lesions, UTERUS: uterus is normal size, shape, consistency and nontender, ADNEXA: normal adnexa in size, nontender and no masses.  NO CMT  Extremities: no edema   Chaperone: Faith Rogue    Assessment & Plan:  1) Dysuria, Subacute vaginitis -plan to r/o underlying infection, further management pending results -if negative work up, suspect discharge may be due to nuvaring -f/u prn  Due for annual in May 2025- last pap 2022   Orders Placed This Encounter  Procedures   Urine Culture   Urinalysis   POC Urinalysis Dipstick OB    Return for May 2025 annual.PAP.   Myna Hidalgo, DO Attending Obstetrician & Gynecologist, [facility] for [employer], [facility]

## 2023-05-24 ENCOUNTER — Encounter: Payer: Self-pay | Admitting: Obstetrics & Gynecology

## 2023-05-24 LAB — URINALYSIS
Bilirubin, UA: NEGATIVE
Glucose, UA: NEGATIVE
Leukocytes,UA: NEGATIVE
Nitrite, UA: NEGATIVE
Specific Gravity, UA: >=1.03 — AB (ref 1.005–1.030)
Urobilinogen, Ur: 0.2 mg/dL (ref 0.2–1.0)
pH, UA: 5.5 (ref 5.0–7.5)

## 2023-05-25 LAB — CERVICOVAGINAL ANCILLARY ONLY
Bacterial Vaginitis (gardnerella): NEGATIVE
Candida Glabrata: NEGATIVE
Candida Vaginitis: NEGATIVE
Chlamydia: NEGATIVE
Comment: NEGATIVE
Comment: NEGATIVE
Comment: NEGATIVE
Comment: NEGATIVE
Comment: NEGATIVE
Comment: NORMAL
Neisseria Gonorrhea: NEGATIVE
Trichomonas: NEGATIVE

## 2023-05-25 LAB — URINE CULTURE

## 2023-07-19 ENCOUNTER — Ambulatory Visit: Admitting: Adult Health

## 2023-07-19 ENCOUNTER — Other Ambulatory Visit (HOSPITAL_COMMUNITY)
Admission: RE | Admit: 2023-07-19 | Discharge: 2023-07-19 | Disposition: A | Discharge status: Home / Self Care | Source: Ambulatory Visit | Attending: Adult Health | Admitting: Adult Health

## 2023-07-19 ENCOUNTER — Encounter: Payer: Self-pay | Admitting: Adult Health

## 2023-07-19 VITALS — BP 126/79 | HR 82 | Ht 68.0 in | Wt 206.0 lb

## 2023-07-19 DIAGNOSIS — Z113 Encounter for screening for infections with a predominantly sexual mode of transmission: Secondary | ICD-10-CM | POA: Diagnosis not present

## 2023-07-19 DIAGNOSIS — N898 Other specified noninflammatory disorders of vagina: Secondary | ICD-10-CM | POA: Insufficient documentation

## 2023-07-19 MED ORDER — SERTRALINE HCL 50 MG PO TABS: 50.0000 mg | ORAL_TABLET | Freq: Every day | ORAL | 3 refills | Status: DC

## 2023-07-19 NOTE — Progress Notes (Signed)
  Subjective:     Patient ID: Tara Colon, female   DOB: 1990-09-06, 33 y.o.   MRN: [id number]  HPI [name] is a 33 year old black female,single, [id number] in complaining of vaginal discharge with odor and irritation, feels like it is BV. She requests refill on zoloft , doing good.     Component Value Date/Time   DIAGPAP  07/21/2020 0909    - Negative for intraepithelial lesion or malignancy (NILM)   HPVHIGH Negative 07/21/2020 0909   ADEQPAP  07/21/2020 0909    Satisfactory for evaluation; transformation zone component ABSENT.   PCP is Evalyn Hillier NP  Review of Systems +vaginal discharge with odor and irritation, feels like it is BV.  She requests refill on zoloft , doing good.    Reviewed past medical,surgical, social and family history. Reviewed medications and allergies.  Objective:   Physical Exam BP 126/79 (BP Location: Right Arm, Patient Position: Sitting, Cuff Size: Normal)   Pulse 82   Ht 5\' 8"  (1.727 m)   Wt 206 lb (93.4 kg)   LMP 06/26/2023   BMI 31.32 kg/m     Skin warm and dry.Pelvic: external genitalia is normal in appearance no lesions, vagina: white discharge without odor,ring in place,urethra has no lesions or masses noted, cervix:smooth and bulbous, uterus: normal size, shape and contour, non tender, no masses felt, adnexa: no masses or tenderness noted. Bladder is non tender and no masses felt. CV swab obtained.  Upstream - 07/19/23 1059       Pregnancy Intention Screening   Does the patient want to become pregnant in the next year? No    Does the patient's partner want to become pregnant in the next year? No    Would the patient like to discuss contraceptive options today? No      Contraception Wrap Up   Current Method Vaginal Ring    End Method Vaginal Ring    Contraception Counseling Provided Yes            Examination chaperoned by Alphonso Aschoff LPN Assessment:     1. Vaginal odor No odor today CV swab sent  - Cervicovaginal ancillary only( [facility])  2. Vaginal discharge (Primary) +white discharge CV swab sent  - Cervicovaginal ancillary only( [place])  3. Screen for STD (sexually transmitted disease) CV swab sent for GC/CHL,trich,BV and yeast  Will check HIV and RPR - Cervicovaginal ancillary only( [place]) - HIV Antibody (routine testing w rflx) - RPR  4. Vaginal irritation CV swab sent  - Cervicovaginal ancillary only( )     Plan:     Return as scheduled for pap 6/11/225

## 2023-07-20 ENCOUNTER — Ambulatory Visit: Admitting: Family Medicine

## 2023-07-20 LAB — CERVICOVAGINAL ANCILLARY ONLY
Bacterial Vaginitis (gardnerella): NEGATIVE
Candida Glabrata: NEGATIVE
Candida Vaginitis: NEGATIVE
Chlamydia: NEGATIVE
Comment: NEGATIVE
Comment: NEGATIVE
Comment: NEGATIVE
Comment: NEGATIVE
Comment: NEGATIVE
Comment: NORMAL
Neisseria Gonorrhea: NEGATIVE
Trichomonas: NEGATIVE

## 2023-07-21 LAB — HIV ANTIBODY (ROUTINE TESTING W REFLEX)

## 2023-07-21 LAB — RPR: RPR Ser Ql: NONREACTIVE

## 2023-07-25 ENCOUNTER — Encounter: Payer: Self-pay | Admitting: Family Medicine

## 2023-07-25 ENCOUNTER — Ambulatory Visit: Admitting: Family Medicine

## 2023-07-25 VITALS — BP 136/85 | HR 91 | Temp 96.8°F | Ht 68.0 in | Wt 207.0 lb

## 2023-07-25 DIAGNOSIS — R3129 Other microscopic hematuria: Secondary | ICD-10-CM

## 2023-07-25 DIAGNOSIS — R8290 Unspecified abnormal findings in urine: Secondary | ICD-10-CM

## 2023-07-25 DIAGNOSIS — Z7729 Contact with and (suspected ) exposure to other hazardous substances: Secondary | ICD-10-CM

## 2023-07-25 LAB — MICROSCOPIC EXAMINATION
Bacteria, UA: NONE SEEN
Renal Epithel, UA: NONE SEEN /HPF
Yeast, UA: NONE SEEN

## 2023-07-25 LAB — URINALYSIS, ROUTINE W REFLEX MICROSCOPIC
Bilirubin, UA: NEGATIVE
Glucose, UA: NEGATIVE
Ketones, UA: NEGATIVE
Leukocytes,UA: NEGATIVE
Nitrite, UA: NEGATIVE
Specific Gravity, UA: 1.025 (ref 1.005–1.030)
Urobilinogen, Ur: 0.2 mg/dL (ref 0.2–1.0)
pH, UA: 5.5 (ref 5.0–7.5)

## 2023-07-25 NOTE — Progress Notes (Signed)
 Subjective:  Patient ID: Tara Colon, female    DOB: 12/05/1990, 33 y.o.   MRN: [id number]  Patient Care Team: Galvin Jules, FNP as PCP - General (Family Medicine) Myrl Askew, RN as Registered Nurse Kerrie Peek, LPN as Licensed Practical Nurse Letha Rav, LPN   Chief Complaint:  Labs Only (States she smelled a dead mouse and would like to be checked to make sure she does not have any virus. ) and odor with urination (X 1 week )   HPI: Tara Colon is a 33 y.o. female presenting on 07/25/2023 for Labs Only (States she smelled a dead mouse and would like to be checked to make sure she does not have any virus. ) and odor with urination (X 1 week ) .  History of Present Illness   Tara Colon is a 33 year old female who presents with concerns about potential hantavirus exposure and urinary symptoms.  She is concerned about potential exposure to hantavirus after visiting a storage building with a strong odor of dead rodents approximately one week ago. She has not experienced any symptoms such as fever, chills, muscle aches, nausea, abdominal pain, or vomiting since the exposure.  She reports urinary symptoms initially associated with her use of the NuvaRing, including abdominal pain, dysuria, and a strong sulfur smell in her urine. After removing the NuvaRing a few days ago, the abdominal cramping resolved. She continues to have the sulfur odor. Recent tests for bacterial vaginosis and sexually transmitted infections were normal. She is concerned about dehydration as a possible cause of the odor and has been trying to increase her water intake.  She experiences headaches, which she associates with her NuvaRing use, but these have improved since its removal. Her last menstrual cycle was expected to start the day after the visit. No fever or other systemic symptoms.          Relevant past medical, surgical, family, and social history reviewed and updated as  indicated.  Allergies and medications reviewed and updated. Data reviewed: Chart in Epic.   Past Medical History:  Diagnosis Date   Anxiety    Bipolar affect, depressed (HCC)    BV (bacterial vaginosis)    Depression    HSV (herpes simplex virus) infection    pt reports no outbreaks   Insomnia     Past Surgical History:  Procedure Laterality Date   NO PAST SURGERIES      Social History   Socioeconomic History   Marital status: Single    Spouse name: Not on file   Number of children: Not on file   Years of education: Not on file   Highest education level: Not on file  Occupational History   Not on file  Tobacco Use   Smoking status: Every Day    Current packs/day: 1.00    Average packs/day: 1 pack/day for 12.0 years (12.0 ttl pk-yrs)    Types: Cigarettes   Smokeless tobacco: Never  Vaping Use   Vaping status: Some Days  Substance and Sexual Activity   Alcohol use: Yes    Alcohol/week: 14.0 standard drinks of alcohol    Types: 14 Cans of beer per week    Comment: occ   Drug use: Not Currently    Types: Marijuana   Sexual activity: Not Currently    Birth control/protection: Inserts  Other Topics Concern   Not on file  Social History Narrative   Not on file   Social  Drivers of Health   Financial Resource Strain: Low Risk  (08/02/2022)   Overall Financial Resource Strain (CARDIA)    Difficulty of Paying Living Expenses: Not hard at all  Food Insecurity: Food Insecurity Present (08/02/2022)   Hunger Vital Sign    Worried About Running Out of Food in the Last Year: Never true    Ran Out of Food in the Last Year: Sometimes true  Transportation Needs: No Transportation Needs (08/02/2022)   PRAPARE - Administrator, Civil Service (Medical): No    Lack of Transportation (Non-Medical): No  Physical Activity: Sufficiently Active (08/02/2022)   Exercise Vital Sign    Days of Exercise per Week: 7 days    Minutes of Exercise per Session: 100 min  Stress:  Stress Concern Present (08/02/2022)   [employer] of Occupational Health - Occupational Stress Questionnaire    Feeling of Stress : Rather much  Social Connections: Socially Isolated (08/02/2022)   Social Connection and Isolation Panel [NHANES]    Frequency of Communication with Friends and Family: More than three times a week    Frequency of Social Gatherings with Friends and Family: More than three times a week    Attends Religious Services: Never    Database administrator or Organizations: No    Attends Banker Meetings: Never    Marital Status: Never married  Intimate Partner Violence: Not At Risk (03/29/2023)   Received from [facility]   HITS    Over the last 12 months how often did your partner physically hurt you?: Never    Over the last 12 months how often did your partner insult you or talk down to you?: Never    Over the last 12 months how often did your partner threaten you with physical harm?: Never    Over the last 12 months how often did your partner scream or curse at you?: Never    Outpatient Encounter Medications as of 07/25/2023  Medication Sig   etonogestrel -ethinyl estradiol  (NUVARING) 0.12-0.015 MG/24HR vaginal ring Insert vaginally and leave in place for 3 consecutive weeks, then remove for 1 week. (Patient not taking: Reported on 07/25/2023)   fluticasone  (FLONASE ) 50 MCG/ACT nasal spray Place 2 sprays into both nostrils daily.   sertraline  (ZOLOFT ) 50 MG tablet Take 1 tablet (50 mg total) by mouth daily.   No facility-administered encounter medications on file as of 07/25/2023.    Allergies  Allergen Reactions   Amoxicillin  Rash    "feel bad"   Sulfamethoxazole -Trimethoprim  Rash    "feels bad"  "feels bad"     "feels bad"    Pertinent ROS per HPI, otherwise unremarkable      Objective:  BP 136/85   Pulse 91   Temp (!) 96.8 F (36 C)   Ht 5\' 8"  (1.727 m)   Wt 207 lb (93.9 kg)   LMP 06/26/2023   SpO2 97%   BMI 31.47 kg/m    Wt  Readings from Last 3 Encounters:  07/25/23 207 lb (93.9 kg)  07/19/23 206 lb (93.4 kg)  05/23/23 204 lb 9.6 oz (92.8 kg)    Physical Exam Vitals and nursing note reviewed.  Constitutional:      Appearance: Normal appearance. She is well-developed. She is obese.  HENT:     Head: Normocephalic and atraumatic.     Nose: Nose normal.     Mouth/Throat:     Mouth: Mucous membranes are moist.     Pharynx: Oropharynx is clear.  Eyes:     Pupils: Pupils are equal, round, and reactive to light.  Cardiovascular:     Rate and Rhythm: Normal rate and regular rhythm.     Heart sounds: Normal heart sounds.  Pulmonary:     Effort: Pulmonary effort is normal.     Breath sounds: Normal breath sounds.  Abdominal:     General: Abdomen is flat. Bowel sounds are normal. There is no distension or abdominal bruit. There are no signs of injury.     Palpations: Abdomen is soft.     Tenderness: There is no abdominal tenderness.  Musculoskeletal:     Right lower leg: No edema.     Left lower leg: No edema.  Skin:    General: Skin is warm and dry.     Capillary Refill: Capillary refill takes less than 2 seconds.  Neurological:     General: No focal deficit present.     Mental Status: She is alert and oriented to person, place, and time.  Psychiatric:        Mood and Affect: Mood normal.        Behavior: Behavior normal.        Thought Content: Thought content normal.        Judgment: Judgment normal.     Results for orders placed or performed in visit on 07/19/23  Cervicovaginal ancillary only( [facility])   Collection Time: 07/19/23 11:21 AM  Result Value Ref Range   Neisseria Gonorrhea Negative    Chlamydia Negative    Trichomonas Negative    Bacterial Vaginitis (gardnerella) Negative    Candida Vaginitis Negative    Candida Glabrata Negative    Comment Normal Reference Range Candida Species - Negative    Comment Normal Reference Range Candida Galbrata - Negative    Comment Normal  Reference Range Trichomonas - Negative    Comment Normal Reference Ranger Chlamydia - Negative    Comment      Normal Reference Range Neisseria Gonorrhea - Negative   Comment      Normal Reference Range Bacterial Vaginosis - Negative  HIV Antibody (routine testing w rflx)   Collection Time: 07/19/23 11:28 AM  Result Value Ref Range   HIV Screen 4th Generation wRfx Non Reactive Non Reactive  RPR   Collection Time: 07/19/23 11:28 AM  Result Value Ref Range   RPR Ser Ql Non Reactive Non Reactive       Pertinent labs & imaging results that were available during my care of the patient were reviewed by me and considered in my medical decision making.  Assessment & Plan:  Tara Colon was seen today for labs only and odor with urination.  Diagnoses and all orders for this visit:  Exposure to rodent Pt asymptomatic   Abnormal urine odor -     Urine Culture -     Urinalysis, Routine w reflex microscopic  Malodorous urine -     Urine Culture -     Urinalysis, Routine w reflex microscopic  Microscopic hematuria -     Urine Culture -     Urinalysis, Routine w reflex microscopic       Dehydration Mild dehydration likely contributing to unusual urine odor. No fever or other systemic symptoms. Urinalysis indicates slight dehydration. - Increase water intake - Send urine culture to rule out infection - Report new or worsening symptoms  Blood in urine Trace hematuria, possibly related to impending menstruation. No other concerning symptoms. - Monitor for persistent hematuria or symptom  changes          Continue all other maintenance medications.  Follow up plan: Return if symptoms worsen or fail to improve.   Continue healthy lifestyle choices, including diet (rich in fruits, vegetables, and lean proteins, and low in salt and simple carbohydrates) and exercise (at least 30 minutes of moderate physical activity daily).   The above assessment and management plan was discussed  with the patient. The patient verbalized understanding of and has agreed to the management plan. Patient is aware to call the clinic if they develop any new symptoms or if symptoms persist or worsen. Patient is aware when to return to the clinic for a follow-up visit. Patient educated on when it is appropriate to go to the emergency department.   Kattie Parrot, FNP-C [facility] [phone]

## 2023-07-30 ENCOUNTER — Encounter: Payer: Self-pay | Admitting: Family Medicine

## 2023-07-30 LAB — URINE CULTURE

## 2023-08-14 ENCOUNTER — Other Ambulatory Visit: Payer: Self-pay | Admitting: Adult Health

## 2023-08-20 ENCOUNTER — Telehealth: Payer: Self-pay

## 2023-08-20 NOTE — Telephone Encounter (Signed)
 Copied from CRM [id number]. Topic: Clinical - Medication Question >> Aug 20, 2023  1:56 PM Tara Colon wrote: Reason for CRM: patient calling requesting refill on anxiety medication she can't remember the name of it, was taking it as needed, started with atarax ? Hydroxyine? Patient last office visit 07/25/23  Patient would  like to to have prescription sent to   [facility] [place], [place] - [address] [place] [address] Phone: [phone] Fax: [phone]

## 2023-08-20 NOTE — Telephone Encounter (Signed)
Pt. Needs to be seen for this. Thanks, WS 

## 2023-08-21 ENCOUNTER — Encounter: Payer: Self-pay | Admitting: Family Medicine

## 2023-08-21 ENCOUNTER — Ambulatory Visit: Admitting: Family Medicine

## 2023-08-21 VITALS — BP 118/73 | HR 93 | Temp 97.8°F | Ht 68.0 in | Wt 202.8 lb

## 2023-08-21 DIAGNOSIS — F411 Generalized anxiety disorder: Secondary | ICD-10-CM | POA: Insufficient documentation

## 2023-08-21 DIAGNOSIS — F4310 Post-traumatic stress disorder, unspecified: Secondary | ICD-10-CM

## 2023-08-21 DIAGNOSIS — F339 Major depressive disorder, recurrent, unspecified: Secondary | ICD-10-CM | POA: Insufficient documentation

## 2023-08-21 MED ORDER — HYDROXYZINE PAMOATE 25 MG PO CAPS: 25.0000 mg | ORAL_CAPSULE | Freq: Three times a day (TID) | ORAL | 1 refills | Status: AC | PRN

## 2023-08-21 NOTE — Patient Instructions (Signed)
 If your symptoms worsen or you have thoughts of suicide/homicide, PLEASE SEEK IMMEDIATE MEDICAL ATTENTION.  You may always call the National Suicide Hotline.  This is available 24 hours a day, 7 days a week.  Their number is: 5810725763  Taking the medicine as directed and not missing any doses is one of the best things you can do to treat your depression.  Here are some things to keep in mind:  Side effects (stomach upset, some increased anxiety) may happen before you notice a benefit.  These side effects typically go away over time. Changes to your dose of medicine or a change in medication all together is sometimes necessary Most people need to be on medication at least 12 months Many people will notice an improvement within two weeks but the full effect of the medication can take up to 4-6 weeks Stopping the medication when you start feeling better often results in a return of symptoms Never discontinue your medication without contacting a health care professional first.  Some medications require gradual discontinuation/ taper and can make you sick if you stop them abruptly.  If your symptoms worsen or you have thoughts of suicide/homicide, PLEASE SEEK IMMEDIATE MEDICAL ATTENTION.  You may always call:  National Suicide Hotline: 440-761-4568 [place] Crisis Line: (424)588-3357 Crisis Recovery in [place]: 331-882-2643  These are available 24 hours a day, 7 days a week.

## 2023-08-21 NOTE — Progress Notes (Signed)
 Subjective:  Patient ID: Tara Colon, female    DOB: 03-Sep-1990, 33 y.o.   MRN: [id number]  Patient Care Team: Galvin Jules, FNP as PCP - General (Family Medicine) Myrl Askew, RN as Registered Nurse Kerrie Peek, LPN as Licensed Practical Nurse Letha Rav, LPN   Chief Complaint:  Anxiety (Since she got released from jail she has been having panic attacks, ptsd worse and has been having mood swings. )   HPI: Tara Colon is a 33 y.o. female presenting on 08/21/2023 for Anxiety (Since she got released from jail she has been having panic attacks, ptsd worse and has been having mood swings. )   [name] is a 33 year old female with PTSD who presents with increased anxiety and panic attacks.  She experiences heightened anxiety and panic attacks, feeling extremely triggered by people and situations. An incident at [place] and [place] led to a panic attack due to the crowd, with a sensation that people were staring at her. She feels a pressing need for immediate relief from these symptoms.  She has a history of PTSD and has been on Zoloft  intermittently. She initially started Zoloft  last year, stopped, and then resumed after being released from jail. She has been consistently taking it since July 14, 2023. Despite this, she continues to feel aggravated and angry, noting that her anger can become severe.  No thoughts of self-harm, but she admits to having thoughts of harming others, although she has not acted on these thoughts. A recent incident, approximately two hours prior to the visit, made her feel close to acting on these thoughts due to a minor provocation.  She mentions being released from jail recently.           08/21/2023    2:40 PM 07/25/2023    3:42 PM 01/17/2023   10:39 AM 01/03/2023   11:51 AM 08/02/2022   10:44 AM  Depression screen PHQ 2/9  Decreased Interest 0 0 0 1 0  Down, Depressed, Hopeless 3 0 0 1 1  PHQ - 2 Score 3 0 0 2 1  Altered  sleeping 3 1 0 3 2  Tired, decreased energy 1 0 0 1 1  Change in appetite 3 0 0 1 0  Feeling bad or failure about yourself  0 0 0 0 0  Trouble concentrating 3 0 0 1 0  Moving slowly or fidgety/restless 3 0 0 0 0  Suicidal thoughts 0 0 0 0 0  PHQ-9 Score 16 1 0 8 4  Difficult doing work/chores Extremely dIfficult Not difficult at all Not difficult at all Very difficult       08/21/2023    2:41 PM 07/25/2023    3:43 PM 01/17/2023   10:39 AM 01/03/2023   11:52 AM  GAD 7 : Generalized Anxiety Score  Nervous, Anxious, on Edge 3 0 0 3  Control/stop worrying 3 0 0 0  Worry too much - different things 3 0 0 0  Trouble relaxing 3 0 0 0  Restless 3 0 0 0  Easily annoyed or irritable 3 0 0 3  Afraid - awful might happen 3 0 0 0  Total GAD 7 Score 21 0 0 6  Anxiety Difficulty Extremely difficult Not difficult at all Not difficult at all Very difficult      Relevant past medical, surgical, family, and social history reviewed and updated as indicated.  Allergies and medications reviewed and updated. Data reviewed: Chart  in Epic.   Past Medical History:  Diagnosis Date   Anxiety    Bipolar affect, depressed (HCC)    BV (bacterial vaginosis)    Depression    HSV (herpes simplex virus) infection    pt reports no outbreaks   Insomnia     Past Surgical History:  Procedure Laterality Date   NO PAST SURGERIES      Social History   Socioeconomic History   Marital status: Single    Spouse name: Not on file   Number of children: Not on file   Years of education: Not on file   Highest education level: Not on file  Occupational History   Not on file  Tobacco Use   Smoking status: Every Day    Current packs/day: 1.00    Average packs/day: 1 pack/day for 12.0 years (12.0 ttl pk-yrs)    Types: Cigarettes   Smokeless tobacco: Never  Vaping Use   Vaping status: Some Days  Substance and Sexual Activity   Alcohol use: Yes    Alcohol/week: 14.0 standard drinks of alcohol    Types: 14  Cans of beer per week    Comment: occ   Drug use: Not Currently    Types: Marijuana   Sexual activity: Not Currently    Birth control/protection: Inserts  Other Topics Concern   Not on file  Social History Narrative   Not on file   Social Drivers of Health   Financial Resource Strain: Low Risk  (08/02/2022)   Overall Financial Resource Strain (CARDIA)    Difficulty of Paying Living Expenses: Not hard at all  Food Insecurity: Food Insecurity Present (08/02/2022)   Hunger Vital Sign    Worried About Running Out of Food in the Last Year: Never true    Ran Out of Food in the Last Year: Sometimes true  Transportation Needs: No Transportation Needs (08/02/2022)   PRAPARE - Administrator, Civil Service (Medical): No    Lack of Transportation (Non-Medical): No  Physical Activity: Sufficiently Active (08/02/2022)   Exercise Vital Sign    Days of Exercise per Week: 7 days    Minutes of Exercise per Session: 100 min  Stress: Stress Concern Present (08/02/2022)   [employer] of Occupational Health - Occupational Stress Questionnaire    Feeling of Stress : Rather much  Social Connections: Socially Isolated (08/02/2022)   Social Connection and Isolation Panel [NHANES]    Frequency of Communication with Friends and Family: More than three times a week    Frequency of Social Gatherings with Friends and Family: More than three times a week    Attends Religious Services: Never    Database administrator or Organizations: No    Attends Banker Meetings: Never    Marital Status: Never married  Intimate Partner Violence: Not At Risk (03/29/2023)   Received from [facility]   HITS    Over the last 12 months how often did your partner physically hurt you?: Never    Over the last 12 months how often did your partner insult you or talk down to you?: Never    Over the last 12 months how often did your partner threaten you with physical harm?: Never    Over the last 12  months how often did your partner scream or curse at you?: Never    Outpatient Encounter Medications as of 08/21/2023  Medication Sig   etonogestrel -ethinyl estradiol  (NUVARING) 0.12-0.015 MG/24HR vaginal ring INSERT VAGINALLY AS  DIRECTED AND LEAVE IN FOR 3 WEEKS, THEN REMOVE FOR 1 WEEK   fluticasone  (FLONASE ) 50 MCG/ACT nasal spray Place 2 sprays into both nostrils daily.   hydrOXYzine  (VISTARIL ) 25 MG capsule Take 1 capsule (25 mg total) by mouth every 8 (eight) hours as needed.   sertraline  (ZOLOFT ) 50 MG tablet Take 1 tablet (50 mg total) by mouth daily.   No facility-administered encounter medications on file as of 08/21/2023.    Allergies  Allergen Reactions   Amoxicillin  Rash    "feel bad"   Sulfamethoxazole -Trimethoprim  Rash    "feels bad"  "feels bad"     "feels bad"    Pertinent ROS per HPI, otherwise unremarkable      Objective:  BP 118/73   Pulse 93   Temp 97.8 F (36.6 C)   Ht 5\' 8"  (1.727 m)   Wt 202 lb 12.8 oz (92 kg)   SpO2 96%   BMI 30.84 kg/m    Wt Readings from Last 3 Encounters:  08/21/23 202 lb 12.8 oz (92 kg)  07/25/23 207 lb (93.9 kg)  07/19/23 206 lb (93.4 kg)    Physical Exam Vitals and nursing note reviewed.  Constitutional:      General: She is not in acute distress.    Appearance: Normal appearance. She is obese. She is not ill-appearing, toxic-appearing or diaphoretic.  HENT:     Head: Normocephalic and atraumatic.     Nose: Nose normal.     Mouth/Throat:     Mouth: Mucous membranes are moist.  Eyes:     Conjunctiva/sclera: Conjunctivae normal.     Pupils: Pupils are equal, round, and reactive to light.  Cardiovascular:     Rate and Rhythm: Normal rate and regular rhythm.     Heart sounds: Normal heart sounds.  Pulmonary:     Effort: Pulmonary effort is normal.     Breath sounds: Normal breath sounds.  Musculoskeletal:     Right lower leg: No edema.     Left lower leg: No edema.  Skin:    General: Skin is warm and dry.      Capillary Refill: Capillary refill takes less than 2 seconds.  Neurological:     General: No focal deficit present.     Mental Status: She is alert.  Psychiatric:        Attention and Perception: Attention and perception normal.        Mood and Affect: Mood is anxious. Mood is not depressed or elated. Affect is not labile, blunt, flat, angry, tearful or inappropriate.        Behavior: Behavior normal. Behavior is cooperative.        Thought Content: Thought content is not paranoid or delusional. Thought content includes homicidal ideation. Thought content does not include suicidal ideation. Thought content does not include homicidal or suicidal plan.        Cognition and Memory: Cognition and memory normal.        Judgment: Judgment normal.     Results for orders placed or performed in visit on 07/25/23  Urine Culture   Collection Time: 07/25/23  3:41 PM   Specimen: Urine   UR  Result Value Ref Range   Urine Culture, Routine Final report    Organism ID, Bacteria Comment   Microscopic Examination   Collection Time: 07/25/23  3:41 PM   Urine  Result Value Ref Range   WBC, UA 0-5 0 - 5 /hpf   RBC, Urine 0-2 0 - 2 /  hpf   Epithelial Cells (non renal) 0-10 0 - 10 /hpf   Renal Epithel, UA None seen None seen /hpf   Bacteria, UA None seen None seen/Few   Yeast, UA None seen None seen  Urinalysis, Routine w reflex microscopic   Collection Time: 07/25/23  3:41 PM  Result Value Ref Range   Specific Gravity, UA 1.025 1.005 - 1.030   pH, UA 5.5 5.0 - 7.5   Color, UA Yellow Yellow   Appearance Ur Clear Clear   Leukocytes,UA Negative Negative   Protein,UA Trace (A) Negative/Trace   Glucose, UA Negative Negative   Ketones, UA Negative Negative   RBC, UA 1+ (A) Negative   Bilirubin, UA Negative Negative   Urobilinogen, Ur 0.2 0.2 - 1.0 mg/dL   Nitrite, UA Negative Negative   Microscopic Examination See below:        Pertinent labs & imaging results that were available during my care  of the patient were reviewed by me and considered in my medical decision making.  Assessment & Plan:  Ailah was seen today for anxiety.  Diagnoses and all orders for this visit:  PTSD (post-traumatic stress disorder) -     hydrOXYzine  (VISTARIL ) 25 MG capsule; Take 1 capsule (25 mg total) by mouth every 8 (eight) hours as needed.  GAD (generalized anxiety disorder) -     hydrOXYzine  (VISTARIL ) 25 MG capsule; Take 1 capsule (25 mg total) by mouth every 8 (eight) hours as needed.  Depression, recurrent (HCC) -     hydrOXYzine  (VISTARIL ) 25 MG capsule; Take 1 capsule (25 mg total) by mouth every 8 (eight) hours as needed.     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 She is currently on Zoloft , which she has been taking consistently since July 14, 2023, after being released from jail. Hydroxyzine  is suggested for as-needed use to manage acute symptoms. The importance of consistency with Zoloft  is emphasized, and the potential need for dose adjustment is discussed if symptoms do not improve. Hydroxyzine  may cause drowsiness, and she is advised to monitor its effects. - Prescribe hydroxyzine  for anxiety, to be taken every 8 hours as needed. - Continue Zoloft  daily for PTSD management. - Evaluate the effectiveness of the current treatment in 4-6 weeks. - Provide crisis intervention hotline numbers for immediate support.  Anger Management Issues She reports episodes of intense anger and thoughts of harming others, though she has not acted on these thoughts. She acknowledges the severity of her anger and the need to manage it to avoid returning to jail. Emphasis is placed on seeking help if she feels she might act on these thoughts, and resources for crisis intervention are provided. - Provide crisis intervention hotline numbers for immediate  support. - Advise to seek immediate help by calling the hotline, returning to the clinic, or going to the hospital if she feels she might act on thoughts of harming others.           Continue all other maintenance medications.  Follow up plan: Return in about 4 weeks (around 09/18/2023), or if symptoms worsen or fail to improve, for Anxiety, Depression.   Continue healthy lifestyle choices, including diet (rich in fruits, vegetables, and lean proteins, and low in salt and simple carbohydrates) and exercise (at least 30 minutes of moderate physical activity daily).  Educational handout given for crisis intervention hotline numbers, depression  The above assessment and management plan was discussed with the patient. The patient verbalized understanding of and has agreed to the management plan. Patient is aware to call the clinic if they develop any new symptoms or if symptoms persist or worsen. Patient is aware when to return to the clinic for a follow-up visit. Patient educated on when it is appropriate to go to the emergency department.   Kattie Parrot, FNP-C [facility] [phone]

## 2023-08-21 NOTE — Telephone Encounter (Signed)
Pt has been scheduled.    LS

## 2023-08-22 ENCOUNTER — Ambulatory Visit: Admitting: Family Medicine

## 2023-08-29 ENCOUNTER — Ambulatory Visit (INDEPENDENT_AMBULATORY_CARE_PROVIDER_SITE_OTHER): Admitting: Adult Health

## 2023-08-29 ENCOUNTER — Encounter: Payer: Self-pay | Admitting: Adult Health

## 2023-08-29 ENCOUNTER — Other Ambulatory Visit (HOSPITAL_COMMUNITY)
Admission: RE | Admit: 2023-08-29 | Discharge: 2023-08-29 | Disposition: A | Discharge status: Home / Self Care | Source: Ambulatory Visit | Attending: Adult Health | Admitting: Adult Health

## 2023-08-29 VITALS — BP 109/67 | HR 70 | Ht 68.0 in | Wt 207.0 lb

## 2023-08-29 DIAGNOSIS — Z1331 Encounter for screening for depression: Secondary | ICD-10-CM

## 2023-08-29 DIAGNOSIS — Z0000 Encounter for general adult medical examination without abnormal findings: Secondary | ICD-10-CM | POA: Insufficient documentation

## 2023-08-29 DIAGNOSIS — F32A Depression, unspecified: Secondary | ICD-10-CM

## 2023-08-29 DIAGNOSIS — R350 Frequency of micturition: Secondary | ICD-10-CM

## 2023-08-29 DIAGNOSIS — F419 Anxiety disorder, unspecified: Secondary | ICD-10-CM | POA: Diagnosis not present

## 2023-08-29 DIAGNOSIS — Z3044 Encounter for surveillance of vaginal ring hormonal contraceptive device: Secondary | ICD-10-CM

## 2023-08-29 LAB — POCT URINALYSIS DIPSTICK
Glucose, UA: NEGATIVE
Ketones, UA: NEGATIVE
Leukocytes, UA: NEGATIVE
Nitrite, UA: NEGATIVE
Protein, UA: NEGATIVE

## 2023-08-29 NOTE — Progress Notes (Signed)
 Patient ID: Daryle Boyington, female   DOB: 02/04/1991, 33 y.o.   MRN: [id number] History of Present Illness: Khylah is a 33 year old black female,single, [id number] in for a well woman gyn exam and pap.   PCP is Evalyn Hillier NP   Current Medications, Allergies, Past Medical History, Past Surgical History, Family History and Social History were reviewed in [name] record.     Review of Systems: Patient denies any headaches, hearing loss, fatigue, blurred vision, shortness of breath, chest pain, abdominal pain, problems with bowel movements, or intercourse(not active). No joint pain or mood swings.  Has had urinary frequency x 1 week    Physical Exam:BP 109/67 (BP Location: Left Arm, Patient Position: Sitting, Cuff Size: Large)   Pulse 70   Ht 5' 8 (1.727 m)   Wt 207 lb (93.9 kg)   LMP 07/28/2023 (Exact Date)   BMI 31.47 kg/m  urine dipstick trace of blood  General:  Well developed, well nourished, no acute distress Skin:  Warm and dry Neck:  Midline trachea, normal thyroid , good ROM, no lymphadenopathy Lungs; Clear to auscultation bilaterally Breast:  No dominant palpable mass, retraction, or nipple discharge Cardiovascular: Regular rate and rhythm Abdomen:  Soft, non tender, no hepatosplenomegaly Pelvic:  External genitalia is normal in appearance, no lesions.  The vagina is normal in appearance, nuva ring in place. Urethra has no lesions or masses. The cervix is bulbous.Pap with HR HPV genotyping performed.  Uterus is felt to be normal size, shape, and contour.  No adnexal masses or tenderness noted.Bladder is non tender, no masses felt Extremities/musculoskeletal:  No swelling or varicosities noted, no clubbing or cyanosis Psych:  No mood changes, alert and cooperative,seems happy AA is 2 Fall risk is low    08/29/2023    2:56 PM 08/21/2023    2:40 PM 07/25/2023    3:42 PM  Depression screen PHQ 2/9  Decreased Interest 0 0 0  Down, Depressed, Hopeless 0 3 0   PHQ - 2 Score 0 3 0  Altered sleeping 0 3 1  Tired, decreased energy 0 1 0  Change in appetite 0 3 0  Feeling bad or failure about yourself  0 0 0  Trouble concentrating 0 3 0  Moving slowly or fidgety/restless 0 3 0  Suicidal thoughts 0 0 0  PHQ-9 Score 0 16 1  Difficult doing work/chores  Extremely dIfficult Not difficult at all       08/29/2023    2:56 PM 08/21/2023    2:41 PM 07/25/2023    3:43 PM 01/17/2023   10:39 AM  GAD 7 : Generalized Anxiety Score  Nervous, Anxious, on Edge 0 3 0 0  Control/stop worrying 0 3 0 0  Worry too much - different things 0 3 0 0  Trouble relaxing 0 3 0 0  Restless 0 3 0 0  Easily annoyed or irritable 0 3 0 0  Afraid - awful might happen 0 3 0 0  Total GAD 7 Score 0 21 0 0  Anxiety Difficulty  Extremely difficult Not difficult at all Not difficult at all      Upstream - 08/29/23 1455       Pregnancy Intention Screening   Does the patient want to become pregnant in the next year? No    Does the patient's partner want to become pregnant in the next year? No    Would the patient like to discuss contraceptive options today? No  Contraception Wrap Up   Current Method Vaginal Ring    End Method Vaginal Ring    Contraception Counseling Provided Yes            Examination chaperoned by Alphonso Aschoff LPN   Impression and plan: 1. Routine general medical examination at a health care facility (Primary) Pap sent Pap in 3 years if negative Physical in 1 year Labs with PCP   - Cytology - PAP( [place])  2. Urinary frequency Urinary frequency x 1 week Trace blood in urine will send for UA C&S to rule out UTI  - POCT Urinalysis Dipstick  3. Encounter for surveillance of vaginal ring hormonal contraceptive device Happy with the nuva ring, has refills   4. Anxiety and depression Has Zoloft  50 mg 1 daily and vistaril  25 mg prn Has refills she says

## 2023-08-29 NOTE — Addendum Note (Signed)
 Addended by: Letha Rav on: 08/29/2023 03:27 PM   Modules accepted: Orders

## 2023-08-30 LAB — MICROSCOPIC EXAMINATION
Bacteria, UA: NONE SEEN
Casts: NONE SEEN /LPF
RBC, Urine: NONE SEEN /HPF (ref 0–2)
WBC, UA: NONE SEEN /HPF (ref 0–5)

## 2023-08-30 LAB — URINALYSIS, ROUTINE W REFLEX MICROSCOPIC
Bilirubin, UA: NEGATIVE
Glucose, UA: NEGATIVE
Ketones, UA: NEGATIVE
Nitrite, UA: NEGATIVE
Protein,UA: NEGATIVE
RBC, UA: NEGATIVE
Specific Gravity, UA: 1.013 (ref 1.005–1.030)
Urobilinogen, Ur: 0.2 mg/dL (ref 0.2–1.0)
pH, UA: 6 (ref 5.0–7.5)

## 2023-08-31 ENCOUNTER — Ambulatory Visit: Payer: Self-pay | Admitting: Adult Health

## 2023-08-31 LAB — URINE CULTURE

## 2023-09-03 DIAGNOSIS — F4320 Adjustment disorder, unspecified: Secondary | ICD-10-CM | POA: Diagnosis not present

## 2023-09-12 ENCOUNTER — Other Ambulatory Visit: Payer: Self-pay | Admitting: Adult Health

## 2023-09-12 MED ORDER — LO LOESTRIN FE 1 MG-10 MCG / 10 MCG PO TABS: 1.0000 | ORAL_TABLET | Freq: Every day | ORAL | 11 refills | Status: AC

## 2023-09-12 NOTE — Progress Notes (Signed)
Will rx lo loestrin  

## 2023-09-25 ENCOUNTER — Ambulatory Visit: Admitting: Family Medicine

## 2023-10-16 ENCOUNTER — Other Ambulatory Visit: Payer: Self-pay | Admitting: *Deleted

## 2023-10-16 MED ORDER — SERTRALINE HCL 50 MG PO TABS: 50.0000 mg | ORAL_TABLET | Freq: Every day | ORAL | 3 refills | Status: DC

## 2023-11-01 ENCOUNTER — Encounter: Payer: Self-pay | Admitting: Family Medicine

## 2023-11-01 ENCOUNTER — Ambulatory Visit: Admitting: Family Medicine

## 2023-11-01 ENCOUNTER — Ambulatory Visit: Payer: Self-pay | Admitting: Family Medicine

## 2023-11-01 VITALS — BP 115/67 | HR 79 | Temp 97.8°F | Ht 68.0 in | Wt 204.4 lb

## 2023-11-01 DIAGNOSIS — R350 Frequency of micturition: Secondary | ICD-10-CM | POA: Diagnosis not present

## 2023-11-01 DIAGNOSIS — R300 Dysuria: Secondary | ICD-10-CM | POA: Diagnosis not present

## 2023-11-01 LAB — URINALYSIS, ROUTINE W REFLEX MICROSCOPIC
Bilirubin, UA: NEGATIVE
Glucose, UA: NEGATIVE
Ketones, UA: NEGATIVE
Leukocytes,UA: NEGATIVE
Nitrite, UA: NEGATIVE
Specific Gravity, UA: 1.025 (ref 1.005–1.030)
Urobilinogen, Ur: 0.2 mg/dL (ref 0.2–1.0)
pH, UA: 5.5 (ref 5.0–7.5)

## 2023-11-01 LAB — MICROSCOPIC EXAMINATION

## 2023-11-01 LAB — PREGNANCY, URINE: Preg Test, Ur: NEGATIVE

## 2023-11-01 NOTE — Progress Notes (Signed)
 Subjective:  Patient ID: Tara Colon, female    DOB: 20-Jun-1990, 33 y.o.   MRN: [id number]  Patient Care Team: Severa Rock HERO, FNP as PCP - General (Family Medicine) Ilean Rutherford HERO, RN as Registered Nurse Bernardo Alan CROME, LPN as Licensed Practical Nurse Neysa Clarita RAMAN, LPN   Chief Complaint:  Urinary Frequency and Dysuria (X 2 days )   HPI: Tara Colon is a 33 y.o. female presenting on 11/01/2023 for Urinary Frequency and Dysuria (X 2 days )   Tara Colon is a 33 year old female with a history of urinary tract infections who presents with frequent urination and a burning sensation.  She experiences frequent urination and a burning sensation without vaginal symptoms, fever, chills, abdominal pain, low back pain, weakness, or confusion. There is a change in urine color, and she suspects dehydration. She has a history of urinary tract infections, with the last occurrence being last year, for which she sought care at an urgent care facility. She has not taken any measures to address her current symptoms.  She is currently on Lo Loestrin  and mentions a recent change in her menstrual cycle, describing it as lighter than usual and occurring at an unexpected time. She denies any new sexual partners, discharge, or pain. Her last menstrual period was atypical, and she is uncertain if it was a period or breakthrough bleeding.          Relevant past medical, surgical, family, and social history reviewed and updated as indicated.  Allergies and medications reviewed and updated. Data reviewed: Chart in Epic.   Past Medical History:  Diagnosis Date   Anxiety    Bipolar affect, depressed (HCC)    BV (bacterial vaginosis)    Depression    HSV (herpes simplex virus) infection    pt reports no outbreaks   Insomnia     Past Surgical History:  Procedure Laterality Date   NO PAST SURGERIES      Social History   Socioeconomic History   Marital status: Single    Spouse  name: Not on file   Number of children: Not on file   Years of education: Not on file   Highest education level: Not on file  Occupational History   Not on file  Tobacco Use   Smoking status: Former    Current packs/day: 1.00    Average packs/day: 1 pack/day for 12.0 years (12.0 ttl pk-yrs)    Types: Cigarettes   Smokeless tobacco: Never  Vaping Use   Vaping status: Some Days  Substance and Sexual Activity   Alcohol use: Yes    Alcohol/week: 14.0 standard drinks of alcohol    Types: 14 Cans of beer per week    Comment: occ   Drug use: Not Currently    Types: Marijuana   Sexual activity: Not Currently    Birth control/protection: Inserts  Other Topics Concern   Not on file  Social History Narrative   Not on file   Social Drivers of Health   Financial Resource Strain: Medium Risk (08/29/2023)   Overall Financial Resource Strain (CARDIA)    Difficulty of Paying Living Expenses: Somewhat hard  Food Insecurity: No Food Insecurity (08/29/2023)   Hunger Vital Sign    Worried About Running Out of Food in the Last Year: Never true    Ran Out of Food in the Last Year: Never true  Transportation Needs: No Transportation Needs (08/29/2023)   PRAPARE - Transportation    Lack  of Transportation (Medical): No    Lack of Transportation (Non-Medical): No  Physical Activity: Insufficiently Active (08/29/2023)   Exercise Vital Sign    Days of Exercise per Week: 2 days    Minutes of Exercise per Session: 30 min  Stress: Stress Concern Present (08/29/2023)   [employer] of Occupational Health - Occupational Stress Questionnaire    Feeling of Stress : Very much  Social Connections: Moderately Isolated (08/29/2023)   Social Connection and Isolation Panel    Frequency of Communication with Friends and Family: More than three times a week    Frequency of Social Gatherings with Friends and Family: More than three times a week    Attends Religious Services: 1 to 4 times per year    Active  Member of [employer] or Organizations: No    Attends Banker Meetings: Never    Marital Status: Never married  Intimate Partner Violence: Not At Risk (08/29/2023)   Humiliation, Afraid, Rape, and Kick questionnaire    Fear of Current or Ex-Partner: No    Emotionally Abused: No    Physically Abused: No    Sexually Abused: No    Outpatient Encounter Medications as of 11/01/2023  Medication Sig   fluticasone (FLONASE) 50 MCG/ACT nasal spray Place 2 sprays into both nostrils daily.   hydrOXYzine (VISTARIL) 25 MG capsule Take 1 capsule (25 mg total) by mouth every 8 (eight) hours as needed.   Norethindrone-Ethinyl Estradiol-Fe Biphas (LO LOESTRIN FE) 1 MG-10 MCG / 10 MCG tablet Take 1 tablet by mouth daily. Take 1 daily by mouth   sertraline (ZOLOFT) 50 MG tablet Take 1 tablet (50 mg total) by mouth daily.   No facility-administered encounter medications on file as of 11/01/2023.    Allergies  Allergen Reactions   Amoxicillin Rash    feel bad   Sulfamethoxazole-Trimethoprim Rash    feels bad  feels bad     feels bad    Pertinent ROS per HPI, otherwise unremarkable      Objective:  BP 115/67   Pulse 79   Temp 97.8 F (36.6 C)   Ht 5' 8 (1.727 m)   Wt 204 lb 6.4 oz (92.7 kg)   SpO2 94%   BMI 31.08 kg/m    Wt Readings from Last 3 Encounters:  11/01/23 204 lb 6.4 oz (92.7 kg)  08/29/23 207 lb (93.9 kg)  08/21/23 202 lb 12.8 oz (92 kg)    Physical Exam Vitals and nursing note reviewed.  Constitutional:      General: She is not in acute distress.    Appearance: Normal appearance. She is not ill-appearing, toxic-appearing or diaphoretic.  HENT:     Head: Normocephalic and atraumatic.     Nose: Nose normal.     Mouth/Throat:     Mouth: Mucous membranes are moist.  Eyes:     Conjunctiva/sclera: Conjunctivae normal.     Pupils: Pupils are equal, round, and reactive to light.  Cardiovascular:     Rate and Rhythm: Normal rate and regular rhythm.      Heart sounds: Normal heart sounds.  Pulmonary:     Effort: Pulmonary effort is normal.     Breath sounds: Normal breath sounds.  Abdominal:     General: Bowel sounds are normal.     Palpations: Abdomen is soft.     Tenderness: There is no abdominal tenderness. There is no right CVA tenderness or left CVA tenderness.  Skin:    General: Skin is warm and  dry.     Capillary Refill: Capillary refill takes less than 2 seconds.  Neurological:     General: No focal deficit present.     Mental Status: She is alert and oriented to person, place, and time.  Psychiatric:        Mood and Affect: Mood normal.        Behavior: Behavior normal.        Thought Content: Thought content normal.        Judgment: Judgment normal.       Results for orders placed or performed in visit on 08/29/23  Urine Culture   Collection Time: 08/29/23  2:06 PM   Specimen: Urine   UR  Result Value Ref Range   Urine Culture, Routine Final report    Organism ID, Bacteria Comment   Microscopic Examination   Collection Time: 08/29/23  2:08 PM  Result Value Ref Range   WBC, UA None seen 0 - 5 /hpf   RBC, Urine None seen 0 - 2 /hpf   Epithelial Cells (non renal) 0-10 0 - 10 /hpf   Casts None seen None seen /lpf   Bacteria, UA None seen None seen/Few  Urinalysis, Routine w reflex microscopic   Collection Time: 08/29/23  2:08 PM  Result Value Ref Range   Specific Gravity, UA 1.013 1.005 - 1.030   pH, UA 6.0 5.0 - 7.5   Color, UA Yellow Yellow   Appearance Ur Clear Clear   Leukocytes,UA Trace (A) Negative   Protein,UA Negative Negative/Trace   Glucose, UA Negative Negative   Ketones, UA Negative Negative   RBC, UA Negative Negative   Bilirubin, UA Negative Negative   Urobilinogen, Ur 0.2 0.2 - 1.0 mg/dL   Nitrite, UA Negative Negative   Microscopic Examination See below:   Cytology - PAP( Victor)   Collection Time: 08/29/23  2:57 PM  Result Value Ref Range   High risk HPV Negative    Adequacy       Satisfactory for evaluation; transformation zone component ABSENT.   Diagnosis      - Negative for intraepithelial lesion or malignancy (NILM)   Comment Normal Reference Range HPV - Negative   POCT Urinalysis Dipstick   Collection Time: 08/29/23  3:00 PM  Result Value Ref Range   Color, UA     Clarity, UA     Glucose, UA Negative Negative   Bilirubin, UA     Ketones, UA neg    Spec Grav, UA     Blood, UA trace    pH, UA     Protein, UA Negative Negative   Urobilinogen, UA     Nitrite, UA neg    Leukocytes, UA Negative Negative   Appearance     Odor         Pertinent labs & imaging results that were available during my care of the patient were reviewed by me and considered in my medical decision making.  Assessment & Plan:  Sakura was seen today for urinary frequency and dysuria.  Diagnoses and all orders for this visit:  Dysuria -     Urine Culture -     Urinalysis, Routine w reflex microscopic -     Pregnancy, urine  Urine frequency -     Urine Culture -     Urinalysis, Routine w reflex microscopic -     Pregnancy, urine     Urinary frequency and dysuria Increased urinary frequency and dysuria without fever, chills, abdominal  pain, back pain, or vaginal symptoms. Urinalysis shows no infection but concentrated urine, likely causing bladder irritation. Negative pregnancy test. No recent urinary tract infection since last year. - Increase water intake significantly - Avoid caffeine - Order urine culture - Notify if urine culture shows abnormalities  Dehydration Possible dehydration indicated by concentrated urine and report of feeling dehydrated. No other symptoms of dehydration reported. - Increase water intake significantly          Continue all other maintenance medications.  Follow up plan: Return if symptoms worsen or fail to improve.   The above assessment and management plan was discussed with the patient. The patient verbalized understanding of  and has agreed to the management plan. Patient is aware to call the clinic if they develop any new symptoms or if symptoms persist or worsen. Patient is aware when to return to the clinic for a follow-up visit. Patient educated on when it is appropriate to go to the emergency department.   Rosaline Bruns, FNP-C [facility] [phone]

## 2023-11-03 LAB — URINE CULTURE

## 2023-11-04 DIAGNOSIS — R531 Weakness: Secondary | ICD-10-CM | POA: Diagnosis not present

## 2023-11-04 DIAGNOSIS — F17210 Nicotine dependence, cigarettes, uncomplicated: Secondary | ICD-10-CM | POA: Diagnosis not present

## 2023-11-04 DIAGNOSIS — Z880 Allergy status to penicillin: Secondary | ICD-10-CM | POA: Diagnosis not present

## 2023-11-04 DIAGNOSIS — R350 Frequency of micturition: Secondary | ICD-10-CM | POA: Diagnosis not present

## 2023-11-05 NOTE — Telephone Encounter (Signed)
 Copied from CRM #[id number]. Topic: Clinical - Lab/Test Results >> Nov 05, 2023 11:29 AM Gustabo D wrote: Patient says she got  her results and something was abnormal she wants a call back

## 2023-11-06 ENCOUNTER — Telehealth: Payer: Self-pay | Admitting: Family Medicine

## 2023-11-06 NOTE — Telephone Encounter (Signed)
 Patient would like a call to discuss her abnormal lab results.

## 2023-11-06 NOTE — Telephone Encounter (Signed)
 Returned call and patient states she does not have any questions

## 2023-11-12 ENCOUNTER — Other Ambulatory Visit: Payer: Self-pay | Admitting: *Deleted

## 2023-11-12 MED ORDER — SERTRALINE HCL 50 MG PO TABS: 50.0000 mg | ORAL_TABLET | Freq: Every day | ORAL | 3 refills | Status: AC

## 2023-12-25 ENCOUNTER — Ambulatory Visit (INDEPENDENT_AMBULATORY_CARE_PROVIDER_SITE_OTHER)

## 2023-12-25 DIAGNOSIS — Z23 Encounter for immunization: Secondary | ICD-10-CM

## 2023-12-26 ENCOUNTER — Ambulatory Visit: Admitting: *Deleted

## 2023-12-26 ENCOUNTER — Other Ambulatory Visit (HOSPITAL_COMMUNITY)
Admission: RE | Admit: 2023-12-26 | Discharge: 2023-12-26 | Disposition: A | Discharge status: Home / Self Care | Source: Ambulatory Visit | Attending: Obstetrics & Gynecology | Admitting: Obstetrics & Gynecology

## 2023-12-26 DIAGNOSIS — Z113 Encounter for screening for infections with a predominantly sexual mode of transmission: Secondary | ICD-10-CM

## 2023-12-26 NOTE — Progress Notes (Addendum)
   NURSE VISIT- VAGINITIS/STD/POC  SUBJECTIVE:  Tara Colon is a 33 y.o. [id number] GYN patientfemale here for a vaginal swab for STD screen.  She reports the following symptoms: none for 0 days. Denies abnormal vaginal bleeding, significant pelvic pain, fever, or UTI symptoms.  OBJECTIVE:  There were no vitals taken for this visit.  Appears well, in no apparent distress  ASSESSMENT: Vaginal swab for STD screen  PLAN: Self-collected vaginal probe for Gonorrhea, Chlamydia, Trichomonas sent to lab Treatment: to be determined once results are received Follow-up as needed if symptoms persist/worsen, or new symptoms develop  Also sent for labs per patient request, HIV, RPR, Hep C and HBsag.  Alan LITTIE Fischer  12/26/2023 8:37 AM

## 2023-12-27 ENCOUNTER — Ambulatory Visit: Payer: Self-pay | Admitting: Adult Health

## 2023-12-27 LAB — HEPATITIS B SURFACE ANTIGEN: Hepatitis B Surface Ag: NEGATIVE

## 2023-12-27 LAB — CERVICOVAGINAL ANCILLARY ONLY
Chlamydia: NEGATIVE
Comment: NEGATIVE
Comment: NEGATIVE
Comment: NORMAL
Neisseria Gonorrhea: NEGATIVE
Trichomonas: NEGATIVE

## 2023-12-27 LAB — RPR: RPR Ser Ql: NONREACTIVE

## 2023-12-27 LAB — HIV ANTIBODY (ROUTINE TESTING W REFLEX): HIV Screen 4th Generation wRfx: NONREACTIVE

## 2023-12-27 LAB — HEPATITIS C ANTIBODY: Hep C Virus Ab: NONREACTIVE

## 2024-02-18 ENCOUNTER — Ambulatory Visit: Admitting: Family Medicine

## 2024-02-18 ENCOUNTER — Encounter: Payer: Self-pay | Admitting: Family Medicine

## 2024-02-18 VITALS — BP 109/65 | HR 77 | Temp 97.5°F | Ht 68.0 in | Wt 203.0 lb

## 2024-02-18 DIAGNOSIS — N760 Acute vaginitis: Secondary | ICD-10-CM

## 2024-02-18 DIAGNOSIS — B3731 Acute candidiasis of vulva and vagina: Secondary | ICD-10-CM | POA: Diagnosis not present

## 2024-02-18 DIAGNOSIS — R300 Dysuria: Secondary | ICD-10-CM

## 2024-02-18 LAB — URINALYSIS, ROUTINE W REFLEX MICROSCOPIC
Glucose, UA: NEGATIVE
Leukocytes,UA: NEGATIVE
Nitrite, UA: NEGATIVE
Specific Gravity, UA: 1.025 (ref 1.005–1.030)
Urobilinogen, Ur: 0.2 mg/dL (ref 0.2–1.0)
pH, UA: 6.5 (ref 5.0–7.5)

## 2024-02-18 LAB — MICROSCOPIC EXAMINATION
Renal Epithel, UA: NONE SEEN /HPF
WBC, UA: NONE SEEN /HPF (ref 0–5)

## 2024-02-18 LAB — WET PREP FOR TRICH, YEAST, CLUE
Clue Cell Exam: NEGATIVE
Trichomonas Exam: NEGATIVE
Yeast Exam: POSITIVE — AB

## 2024-02-18 MED ORDER — FLUCONAZOLE 150 MG PO TABS: 150.0000 mg | ORAL_TABLET | Freq: Once | ORAL | 0 refills | Status: AC

## 2024-02-18 NOTE — Progress Notes (Signed)
   Acute Office Visit  Subjective:     Patient ID: Tara Colon, female    DOB: Dec 25, 1990, 33 y.o.   MRN: [id number]  Chief Complaint  Patient presents with   Dysuria    Dysuria     History of Present Illness   Tara Colon is a 33 year old female who presents with burning urination and lower abdominal pain.  Dysuria and lower abdominal pain - Burning sensation with urination for approximately one week - lower abdominal pain over the weekend - No increased urinary frequency unless consuming large amounts of water - No hematuria, urgency, flank pain, or fever  Vaginal discharge and menstrual irregularity - Clear vaginal discharge observed when wiping after urination over the weekend, not present in underwear - No vaginal itching - No vaginal bleeding except when using over-the-counter Monistat , which has previously triggered her menstrual period - Menstrual cycle irregular, last period began on November 20th       Review of Systems  Genitourinary:  Positive for dysuria.   As per HPI.      Objective:    BP 109/65   Pulse 77   Temp (!) 97.5 F (36.4 C) (Temporal)   Ht 5' 8 (1.727 m)   Wt 203 lb (92.1 kg)   SpO2 99%   BMI 30.87 kg/m    Physical Exam Vitals and nursing note reviewed.  Constitutional:      General: She is not in acute distress.    Appearance: Normal appearance. She is not ill-appearing, toxic-appearing or diaphoretic.  Pulmonary:     Effort: Pulmonary effort is normal. No respiratory distress.  Abdominal:     General: There is no distension.     Tenderness: There is no abdominal tenderness. There is no right CVA tenderness, left CVA tenderness, guarding or rebound.  Musculoskeletal:     Right lower leg: No edema.     Left lower leg: No edema.  Skin:    General: Skin is warm and dry.  Neurological:     General: No focal deficit present.     Mental Status: She is alert and oriented to person, place, and time.  Psychiatric:        Mood  and Affect: Mood normal.        Behavior: Behavior normal.    Urine dipstick shows positive for RBC's, positive for protein, and positive for leukocytes.  Micro exam: 0 WBC's per HPF, 0-2 RBC's per HPF, few bacteria, yeast present.   Microscopic wet-mount exam shows lactobacilli.      Assessment & Plan:   Tara Colon was seen today for dysuria.  Diagnoses and all orders for this visit:  Dysuria -     Urinalysis, Routine w reflex microscopic -     WET PREP FOR TRICH, YEAST, CLUE -     Urine Culture  Acute vaginitis -     WET PREP FOR TRICH, YEAST, CLUE  Yeast vaginitis -     fluconazole  (DIFLUCAN ) 150 MG tablet; Take 1 tablet (150 mg total) by mouth once for 1 dose. Repeat in 3 days if symptoms persist.  Wet prep with yeast present. Diflucan  as above. UA is not compelling for UTI. Culture is pending. Return to office for new or worsening symptoms, or if symptoms persist.   The patient indicates understanding of these issues and agrees with the plan.  Tara CHRISTELLA Search, FNP

## 2024-03-27 ENCOUNTER — Ambulatory Visit: Admitting: Nurse Practitioner

## 2024-03-27 ENCOUNTER — Encounter: Payer: Self-pay | Admitting: Nurse Practitioner

## 2024-03-27 VITALS — BP 123/78 | HR 62 | Temp 97.8°F | Ht 68.0 in | Wt 199.0 lb

## 2024-03-27 DIAGNOSIS — K279 Peptic ulcer, site unspecified, unspecified as acute or chronic, without hemorrhage or perforation: Secondary | ICD-10-CM

## 2024-03-27 DIAGNOSIS — Z09 Encounter for follow-up examination after completed treatment for conditions other than malignant neoplasm: Secondary | ICD-10-CM

## 2024-03-27 DIAGNOSIS — K921 Melena: Secondary | ICD-10-CM

## 2024-03-27 NOTE — Progress Notes (Signed)
 "  Subjective:    Patient ID: Tara Colon, female    DOB: June 16, 1990, 34 y.o.   MRN: [id number]   Chief Complaint: hospital follow up  HPI Patient went to the ED on 03/23/24 with abdominal pain. ED note states: Patient 34 year old female history of alcoholism and drug abuse now with complaints of melena today no vomiting or diarrhea and no hematemesis by exam awake alert cooperative the abdomen slightly benign at this time no epigastric pain tenderness cardiopulmonary general neuroexam negative. Was dx with ulcer. Needs referral to GI for work up. They treated her with pepcid  and protonix.  Discharge instructions:Follow-up with the GI of your choice [facility] for outpatient EGD stop use of alcohol and drug use. At the current time you have presumed active peptic ulcer disease with some evidence of subacute bleeding but no indication for hospitalization. You will require additional evaluation for melena to rule out ulcer in the meantime consider taking treatment for presumed ulcer disease return immediately for throwing up blood passing blood or worsening symptoms   Since discharge, she has been taking pepcid  and protonix. Stools have turned back to normal color. No abdominal pain.   Patient Active Problem List   Diagnosis Date Noted   Routine general medical examination at a health care facility 08/29/2023   Urinary frequency 08/29/2023   Encounter for surveillance of vaginal ring hormonal contraceptive device 08/29/2023   Anxiety and depression 08/29/2023   GAD (generalized anxiety disorder) 08/21/2023   Depression, recurrent 08/21/2023   Mass of upper outer quadrant of left breast 08/02/2022   Atopic dermatitis in adult 05/03/2022   BMI 32.0-32.9,adult 05/03/2022   Screen for STD (sexually transmitted disease) 07/28/2021   Alcohol use, unspecified with unspecified alcohol-induced disorder 04/01/2021   Anxiety 02/23/2020   Alopecia 01/29/2020   History of preterm delivery  12/14/2018   Marijuana use 07/17/2018   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  Eyes:  Negative for pain.  Respiratory:  Negative for shortness of breath.   Cardiovascular:  Negative for chest pain, palpitations and leg swelling.  Gastrointestinal:  Negative for abdominal pain.  Endocrine: Negative for polydipsia.  Skin:  Negative for rash.  Neurological:  Negative for dizziness, weakness and headaches.  Hematological:  Does not bruise/bleed easily.  All other systems reviewed and are negative.      Objective:   Physical Exam Constitutional:      Appearance: Normal appearance.  Cardiovascular:     Rate and Rhythm: Normal rate and regular rhythm.     Heart sounds: Normal heart sounds.  Pulmonary:     Breath sounds: Normal breath sounds.  Skin:    General: Skin is warm.  Neurological:     General: No focal deficit present.     Mental Status: She is alert.  Psychiatric:        Mood and Affect: Mood normal.        Behavior: Behavior normal.    BP 123/78   Pulse 62   Temp 97.8 F (36.6 C) (Temporal)   Ht 5' 8 (1.727 m)   Wt 199 lb (90.3 kg)   SpO2 96%   BMI 30.26 kg/m         Assessment & Plan:  Tara Colon in today with chief complaint of Hospitalization Follow-up  1. Melena (Primary) - Ambulatory referral to Gastroenterology  2. Peptic ulcer Continue protonix and pepcid  Avoid alcohol Bland diet  3. Hospital discharge follow-up Hospital records reviewed    The above assessment and management plan was discussed with the patient. The patient verbalized understanding of and has agreed to the management plan. Patient is aware to call the clinic if symptoms persist or worsen. Patient is aware when to return to the clinic for a follow-up visit. Patient  educated on when it is appropriate to go to the emergency department.   Mary-Margaret Gladis, FNP    "

## 2024-03-27 NOTE — Patient Instructions (Signed)
 Peptic Ulcer  A peptic ulcer is a painful sore in the lining of your stomach or the first part of your small intestine. What are the causes? Common causes of this condition include: An infection. Using certain pain medicines too often or too much. Rare tumors in the stomach, small intestine, or pancreas. What increases the risk? You are more likely to get this condition if you: Smoke. Have a family history of ulcer disease. Drink alcohol. Have been hospitalized in an intensive care unit (ICU). What are the signs or symptoms? Symptoms include: Burning pain in the area between the chest and the belly button. The pain may: Not go away (be persistent). Be worse when your stomach is empty. Be worse at night. Heartburn. Feeling sick to your stomach (nauseous) and throwing up (vomiting). Bloating. If the ulcer results in bleeding, it can cause you to: Have poop (stool) that is black and looks like tar. Throw up bright red blood. Throw up material that looks like coffee grounds. How is this treated? Treatment for this condition may include: Stopping things that can cause the ulcer, such as: Smoking. Using pain medicines. Drinking alcohol or caffeine. Medicines to reduce stomach acid. Antibiotic medicines if the ulcer is caused by an infection. A procedure that is done using a small, flexible tube that has a camera at the end (upper endoscopy). This may be done if you have a bleeding ulcer. Surgery. This may be needed if: You have a lot of bleeding. The ulcer caused a hole somewhere in the digestive system. Follow these instructions at home: Do not drink alcohol if your doctor tells you not to drink. Limit how much caffeine you take in. Do not smoke or use any products that contain nicotine or tobacco. If you need help quitting, ask your doctor. Take over-the-counter and prescription medicines only as told by your doctor. Do not stop or change your medicines unless you talk with  your doctor about it first. Do not take aspirin, ibuprofen, or other NSAIDs unless your doctor told you to do so. Keep all follow-up visits. Contact a doctor if: You do not get better in 7 days after you start treatment. You keep having an upset stomach (indigestion) or heartburn. Get help right away if: You have sudden, sharp pain in your belly (abdomen). You have belly pain that does not go away. You have bloody poop (stool) or black, tarry poop. You throw up blood. It may look like coffee grounds. You feel light-headed or feel like you may pass out (faint). You get weak. You get sweaty or feel sticky and cold to the touch (clammy). These symptoms may be an emergency. Get help right away. Call 911. Do not wait to see if the symptoms will go away. Do not drive yourself to the hospital. Summary Symptoms of a peptic ulcer include burning pain in the area between the chest and the belly button. Do not smoke or use any products that contain nicotine or tobacco. If you need help quitting, ask your doctor. Take medicines only as told by your doctor. Limit how much alcohol and caffeine you have. Keep all follow-up visits. This information is not intended to replace advice given to you by your health care provider. Make sure you discuss any questions you have with your health care provider. Document Revised: 10/14/2020 Document Reviewed: 10/15/2020 Elsevier Patient Education  2024 [employer].

## 2024-04-03 ENCOUNTER — Emergency Department (HOSPITAL_COMMUNITY)
Admission: EM | Admit: 2024-04-03 | Discharge: 2024-04-03 | Disposition: A | Discharge status: Home / Self Care | Attending: Emergency Medicine | Admitting: Emergency Medicine

## 2024-04-03 ENCOUNTER — Encounter (HOSPITAL_COMMUNITY): Payer: Self-pay

## 2024-04-03 ENCOUNTER — Other Ambulatory Visit: Payer: Self-pay

## 2024-04-03 ENCOUNTER — Ambulatory Visit: Payer: Self-pay

## 2024-04-03 DIAGNOSIS — R109 Unspecified abdominal pain: Secondary | ICD-10-CM | POA: Diagnosis present

## 2024-04-03 DIAGNOSIS — K922 Gastrointestinal hemorrhage, unspecified: Secondary | ICD-10-CM | POA: Diagnosis not present

## 2024-04-03 LAB — CBC
HCT: 44.9 % (ref 36.0–46.0)
Hemoglobin: 14.5 g/dL (ref 12.0–15.0)
MCH: 30.9 pg (ref 26.0–34.0)
MCHC: 32.3 g/dL (ref 30.0–36.0)
MCV: 95.5 fL (ref 80.0–100.0)
Platelets: 268 K/uL (ref 150–400)
RBC: 4.7 MIL/uL (ref 3.87–5.11)
RDW: 12.7 % (ref 11.5–15.5)
WBC: 4.6 K/uL (ref 4.0–10.5)
nRBC: 0 % (ref 0.0–0.2)

## 2024-04-03 LAB — COMPREHENSIVE METABOLIC PANEL WITH GFR
ALT: 11 U/L (ref 0–44)
AST: 20 U/L (ref 15–41)
Albumin: 4.6 g/dL (ref 3.5–5.0)
Alkaline Phosphatase: 91 U/L (ref 38–126)
Anion gap: 14 (ref 5–15)
BUN: 12 mg/dL (ref 6–20)
CO2: 23 mmol/L (ref 22–32)
Calcium: 9.7 mg/dL (ref 8.9–10.3)
Chloride: 104 mmol/L (ref 98–111)
Creatinine, Ser: 0.75 mg/dL (ref 0.44–1.00)
GFR, Estimated: >60 mL/min
Glucose, Bld: 88 mg/dL (ref 70–99)
Potassium: 3.6 mmol/L (ref 3.5–5.1)
Sodium: 140 mmol/L (ref 135–145)
Total Bilirubin: 0.5 mg/dL (ref 0.0–1.2)
Total Protein: 7.6 g/dL (ref 6.5–8.1)

## 2024-04-03 LAB — HCG, SERUM, QUALITATIVE: Preg, Serum: NEGATIVE

## 2024-04-03 MED ORDER — LANSOPRAZOLE 30 MG PO CPDR: 30.0000 mg | DELAYED_RELEASE_CAPSULE | Freq: Every day | ORAL | 0 refills | Status: AC

## 2024-04-03 NOTE — ED Triage Notes (Addendum)
 Pt here for abd pain/back pain/dark stools/ hematemesis for 2 weeks. C/O nausea. No vomtiing. Axox4. Pt states slight epigastric pain.

## 2024-04-03 NOTE — Discharge Instructions (Addendum)
 Continue to abstain from alcohol and NSAIDs such as ibuprofen  Aleve  and meloxicam.  Follow-up with a gastroenterologist that given you the information for an office to follow-up with.  Continue taking your prescribed medications

## 2024-04-03 NOTE — Telephone Encounter (Signed)
 Noted. Ls

## 2024-04-03 NOTE — ED Provider Notes (Signed)
 " [facility] Provider Note   CSN: [id number] Arrival date & time: 04/03/24  9057     Patient presents with: Abdominal Pain   Tara Colon is a 34 y.o. female.    Abdominal Pain  Patient is a 34 year old female with a past medical history significant for alcohol use disorder, recurrent abdominal drug use according to previous notes  Patient presents emergency room today with complaints of dark tarry stool for 2 or 3 weeks.  She was seen in outside hospital and referred to gastroenterology.  She came to the ER today for reevaluation she is not having any chest pain abdominal pain nausea or vomiting no fevers no lightheadedness or dizziness.  She is currently taking Pepto-Bismol.  She states that her stool is black but is not had any significant changes in frequency or consistency of stool.  No lightheadedness or dizziness or shortness of breath.     Prior to Admission medications  Medication Sig Start Date End Date Taking? Authorizing Provider  hydrOXYzine  (VISTARIL ) 25 MG capsule Take 1 capsule (25 mg total) by mouth every 8 (eight) hours as needed. 08/21/23  Yes Rakes, Rock HERO, FNP  lansoprazole  (PREVACID ) 30 MG capsule Take 1 capsule (30 mg total) by mouth daily at 12 noon. 04/03/24  Yes Trevor Wilkie S, PA  lansoprazole  (PREVACID ) 30 MG capsule Take 30 mg by mouth daily at 12 noon.   Yes [provider]  promethazine  (PHENERGAN ) 25 MG tablet Take 25 mg by mouth every 6 (six) hours as needed for nausea or vomiting.   Yes [provider]  sertraline  (ZOLOFT ) 50 MG tablet Take 1 tablet (50 mg total) by mouth daily. 11/12/23  Yes Signa Delon LABOR, NP  Norethindrone-Ethinyl Estradiol -Fe Biphas (LO LOESTRIN FE ) 1 MG-10 MCG / 10 MCG tablet Take 1 tablet by mouth daily. Take 1 daily by mouth Patient not taking: Reported on 04/03/2024 09/12/23   Signa Delon LABOR, NP    Allergies: Amoxicillin  and Bactrim   [sulfamethoxazole -trimethoprim ]    Review of Systems  Gastrointestinal:  Positive for abdominal pain.    Updated Vital Signs BP 120/86 (BP Location: Right Arm)   Pulse 89   Temp 97.9 F (36.6 C) (Oral)   Resp 17   Ht 5' 8 (1.727 m)   Wt 89.8 kg   LMP 03/22/2024   SpO2 100%   BMI 30.11 kg/m   Physical Exam Vitals and nursing note reviewed.  Constitutional:      General: She is not in acute distress. HENT:     Head: Normocephalic and atraumatic.     Nose: Nose normal.     Mouth/Throat:     Mouth: Mucous membranes are moist.  Eyes:     General: No scleral icterus. Cardiovascular:     Rate and Rhythm: Normal rate and regular rhythm.     Pulses: Normal pulses.     Heart sounds: Normal heart sounds.  Pulmonary:     Effort: Pulmonary effort is normal. No respiratory distress.     Breath sounds: No wheezing.  Abdominal:     Palpations: Abdomen is soft.     Tenderness: There is no abdominal tenderness.  Musculoskeletal:     Cervical back: Normal range of motion.     Right lower leg: No edema.     Left lower leg: No edema.  Skin:    General: Skin is warm and dry.     Capillary Refill: Capillary refill takes less than 2  seconds.  Neurological:     Mental Status: She is alert. Mental status is at baseline.  Psychiatric:        Mood and Affect: Mood normal.        Behavior: Behavior normal.     (all labs ordered are listed, but only abnormal results are displayed) Labs Reviewed  COMPREHENSIVE METABOLIC PANEL WITH GFR  CBC  HCG, SERUM, QUALITATIVE    EKG: None  Radiology: No results found.   Procedures   Medications Ordered in the ED - No data to display                                  Medical Decision Making Amount and/or Complexity of Data Reviewed Labs: ordered.  Risk Prescription drug management.   Patient is a 34 year old female with a past medical history significant for alcohol use disorder, recurrent abdominal drug use according to  previous notes  Patient presents emergency room today with complaints of dark tarry stool for 2 or 3 weeks.  She was seen in outside hospital and referred to gastroenterology.  She came to the ER today for reevaluation she is not having any chest pain abdominal pain nausea or vomiting no fevers no lightheadedness or dizziness.  She is currently taking Pepto-Bismol.  She states that her stool is black but is not had any significant changes in frequency or consistency of stool.  No lightheadedness or dizziness or shortness of breath.  Patient had a Hemoccult test done previously.  Offered this today to differentiate black stool from Pepto-Bismol versus blood she declined this and I think that this is very reasonable.  CBC with hemoglobin of 14.5, negative pregnancy test.  She is well-appearing has normal vital signs and is comfortable plan plan to discharge her home with follow-up with gastroenterology.  Seems that she [place] north of [place] but would prefer [facility] for gastroenterology follow-up.  Will discharge home with refill of her Prevacid  which she was placed on previously.  Counseled on return precautions to the emergency room.  Patient agreeable to plan will discharge home.  Final diagnoses:  Upper GI bleeding    ED Discharge Orders          Ordered    lansoprazole  (PREVACID ) 30 MG capsule  Daily        04/03/24 1138    Ambulatory referral to Gastroenterology        04/03/24 [address] [place], [place] 04/11/24 [phone]  "

## 2024-04-03 NOTE — Telephone Encounter (Signed)
 FYI Only or Action Required?: FYI only for provider: ED info given to pt.  Patient was last seen in primary care on 03/27/2024 by Gladis Mustard, FNP.  Called Nurse Triage reporting Abdominal Pain.  Symptoms began several weeks ago.  Interventions attempted: Nothing.  Symptoms are: gradually worsening.  Triage Disposition: No disposition on file.  Patient/caregiver understands and will follow disposition?: Yes     Copied from CRM #[id number]. Topic: Clinical - Red Word Triage >> Apr 03, 2024  8:23 AM Emylou G wrote: Red Word that prompted transfer to Nurse Triage: Patient call.. looking for a way to see someone urgent about her peptic ulcer - she isn't getting a response from the referral.. need ER  Extreme pain Answer Assessment - Initial Assessment Questions Pt states she has not rec'd appt for GI referral. Wants to go to er to have fixed today.  Adivsed to go to [facility], address given   1. REASON FOR CALL: What is the main reason for your call? or How can I best help you?     Requesting info on which ER to go to to have peptic ulcer fixed 2. SYMPTOMS : Do you have any symptoms?      Abd pain 3. OTHER QUESTIONS: Do you have any other questions?  Protocols used: Information Only Call - No Triage-A-AH

## 2024-04-09 ENCOUNTER — Telehealth: Payer: Self-pay | Admitting: *Deleted

## 2024-04-09 DIAGNOSIS — F339 Major depressive disorder, recurrent, unspecified: Secondary | ICD-10-CM

## 2024-04-09 NOTE — Progress Notes (Signed)
 Complex Care Management Note Care Guide Note  04/09/2024 Name: Tara Colon MRN: [id number] DOB: 04-10-1990   Complex Care Management Outreach Attempts: An unsuccessful telephone outreach was attempted today to offer the patient information about available complex care management services.  Follow Up Plan:  Additional outreach attempts will be made to offer the patient complex care management information and services.   Encounter Outcome:  No Answer Harlene Satterfield  [facility]  [facility], [facility] Guide  Direct Dial : [phone]  Fax [phone]

## 2024-04-10 NOTE — Progress Notes (Signed)
 Complex Care Management Note Care Guide Note  04/10/2024 Name: Mahogony Gilchrest MRN: [id number] DOB: 20-Jan-1991   Complex Care Management Outreach Attempts: A second unsuccessful outreach was attempted today to offer the patient with information about available complex care management services.  Follow Up Plan:  Additional outreach attempts will be made to offer the patient complex care management information and services.   Encounter Outcome:  No Answer  Harlene Satterfield  [facility]  [facility], [facility] Guide  Direct Dial : [phone]  Fax [phone]

## 2024-04-15 NOTE — Progress Notes (Signed)
 Complex Care Management Note Care Guide Note  04/15/2024 Name: Tara Colon Whatley MRN: [id number] DOB: Oct 06, 1990   Complex Care Management Outreach Attempts: A third unsuccessful outreach was attempted today to offer the patient with information about available complex care management services.  Follow Up Plan:  No further outreach attempts will be made at this time. We have been unable to contact the patient to offer or enroll patient in complex care management services.  Encounter Outcome:  No Answer  Harlene Satterfield  [facility]  [facility], [facility] Guide  Direct Dial : [phone]  Fax [phone]
